# Patient Record
Sex: Female | Born: 1960 | Race: White | Hispanic: No | Marital: Married | Smoking: Never smoker
Health system: Southern US, Community
[De-identification: ages and names within clinical notes are randomized; demographics above are authoritative.]

## PROBLEM LIST (undated history)

## (undated) DIAGNOSIS — G4730 Sleep apnea, unspecified: Secondary | ICD-10-CM

## (undated) DIAGNOSIS — E039 Hypothyroidism, unspecified: Secondary | ICD-10-CM

## (undated) DIAGNOSIS — K219 Gastro-esophageal reflux disease without esophagitis: Secondary | ICD-10-CM

## (undated) DIAGNOSIS — I10 Essential (primary) hypertension: Secondary | ICD-10-CM

## (undated) DIAGNOSIS — I749 Embolism and thrombosis of unspecified artery: Secondary | ICD-10-CM

## (undated) DIAGNOSIS — IMO0001 Reserved for inherently not codable concepts without codable children: Secondary | ICD-10-CM

## (undated) DIAGNOSIS — I517 Cardiomegaly: Secondary | ICD-10-CM

## (undated) DIAGNOSIS — G5601 Carpal tunnel syndrome, right upper limb: Secondary | ICD-10-CM

## (undated) DIAGNOSIS — M1990 Unspecified osteoarthritis, unspecified site: Secondary | ICD-10-CM

## (undated) DIAGNOSIS — E079 Disorder of thyroid, unspecified: Secondary | ICD-10-CM

## (undated) HISTORY — DX: Embolism and thrombosis of unspecified artery: I74.9

## (undated) HISTORY — DX: Cardiomegaly: I51.7

## (undated) HISTORY — PX: EYE SURGERY: SHX253

## (undated) HISTORY — PX: SINUS SURGERY WITH INSTATRAK: SHX5215

## (undated) HISTORY — PX: CARPAL TUNNEL RELEASE: SHX101

## (undated) HISTORY — DX: Carpal tunnel syndrome, right upper limb: G56.01

## (undated) HISTORY — DX: Essential (primary) hypertension: I10

## (undated) HISTORY — DX: Disorder of thyroid, unspecified: E07.9

## (undated) HISTORY — DX: Sleep apnea, unspecified: G47.30

---

## 1997-04-07 ENCOUNTER — Encounter: Payer: Self-pay | Admitting: Critical Care Medicine

## 2004-11-22 ENCOUNTER — Emergency Department (HOSPITAL_COMMUNITY): Admission: EM | Admit: 2004-11-22 | Discharge: 2004-11-22 | Payer: Self-pay | Admitting: Family Medicine

## 2005-04-12 DIAGNOSIS — I749 Embolism and thrombosis of unspecified artery: Secondary | ICD-10-CM

## 2005-04-12 HISTORY — DX: Embolism and thrombosis of unspecified artery: I74.9

## 2005-04-12 HISTORY — PX: KNEE SURGERY: SHX244

## 2005-04-23 ENCOUNTER — Ambulatory Visit (HOSPITAL_COMMUNITY): Admission: EM | Admit: 2005-04-23 | Discharge: 2005-04-24 | Payer: Self-pay | Admitting: Emergency Medicine

## 2005-05-03 ENCOUNTER — Inpatient Hospital Stay (HOSPITAL_COMMUNITY): Admission: EM | Admit: 2005-05-03 | Discharge: 2005-05-06 | Payer: Self-pay | Admitting: Emergency Medicine

## 2005-10-04 ENCOUNTER — Encounter (INDEPENDENT_AMBULATORY_CARE_PROVIDER_SITE_OTHER): Payer: Self-pay | Admitting: *Deleted

## 2005-10-04 ENCOUNTER — Ambulatory Visit (HOSPITAL_COMMUNITY): Admission: RE | Admit: 2005-10-04 | Discharge: 2005-10-04 | Payer: Self-pay

## 2006-01-06 ENCOUNTER — Ambulatory Visit (HOSPITAL_COMMUNITY): Admission: RE | Admit: 2006-01-06 | Discharge: 2006-01-06 | Payer: Self-pay

## 2006-01-06 ENCOUNTER — Encounter: Payer: Self-pay | Admitting: Vascular Surgery

## 2006-08-19 ENCOUNTER — Ambulatory Visit: Payer: Self-pay | Admitting: Vascular Surgery

## 2009-12-25 ENCOUNTER — Encounter: Payer: Self-pay | Admitting: Family

## 2009-12-26 ENCOUNTER — Encounter: Payer: Self-pay | Admitting: Family

## 2010-01-02 ENCOUNTER — Encounter: Payer: Self-pay | Admitting: Family

## 2010-01-14 ENCOUNTER — Encounter: Payer: Self-pay | Admitting: Family

## 2010-01-15 ENCOUNTER — Encounter: Payer: Self-pay | Admitting: Critical Care Medicine

## 2010-01-16 ENCOUNTER — Encounter: Payer: Self-pay | Admitting: Critical Care Medicine

## 2010-01-16 ENCOUNTER — Ambulatory Visit (HOSPITAL_BASED_OUTPATIENT_CLINIC_OR_DEPARTMENT_OTHER): Admission: RE | Admit: 2010-01-16 | Discharge: 2010-01-16 | Payer: Self-pay | Admitting: Internal Medicine

## 2010-01-16 ENCOUNTER — Ambulatory Visit: Payer: Self-pay | Admitting: Diagnostic Radiology

## 2010-01-16 ENCOUNTER — Ambulatory Visit (HOSPITAL_COMMUNITY): Admission: RE | Admit: 2010-01-16 | Discharge: 2010-01-16 | Payer: Self-pay | Admitting: Internal Medicine

## 2010-01-16 ENCOUNTER — Telehealth: Payer: Self-pay | Admitting: Family

## 2010-01-16 ENCOUNTER — Ambulatory Visit: Payer: Self-pay | Admitting: Family

## 2010-01-16 DIAGNOSIS — R609 Edema, unspecified: Secondary | ICD-10-CM

## 2010-01-16 DIAGNOSIS — G4733 Obstructive sleep apnea (adult) (pediatric): Secondary | ICD-10-CM

## 2010-01-16 DIAGNOSIS — I517 Cardiomegaly: Secondary | ICD-10-CM | POA: Insufficient documentation

## 2010-01-16 DIAGNOSIS — R0989 Other specified symptoms and signs involving the circulatory and respiratory systems: Secondary | ICD-10-CM

## 2010-01-16 DIAGNOSIS — R0609 Other forms of dyspnea: Secondary | ICD-10-CM | POA: Insufficient documentation

## 2010-01-16 DIAGNOSIS — J45909 Unspecified asthma, uncomplicated: Secondary | ICD-10-CM

## 2010-01-16 DIAGNOSIS — Z8672 Personal history of thrombophlebitis: Secondary | ICD-10-CM

## 2010-01-16 DIAGNOSIS — E039 Hypothyroidism, unspecified: Secondary | ICD-10-CM

## 2010-01-16 HISTORY — DX: Edema, unspecified: R60.9

## 2010-01-16 HISTORY — DX: Unspecified asthma, uncomplicated: J45.909

## 2010-01-16 HISTORY — DX: Hypothyroidism, unspecified: E03.9

## 2010-01-16 HISTORY — DX: Morbid (severe) obesity due to excess calories: E66.01

## 2010-01-16 HISTORY — DX: Obstructive sleep apnea (adult) (pediatric): G47.33

## 2010-01-16 HISTORY — DX: Other forms of dyspnea: R06.09

## 2010-01-16 HISTORY — DX: Personal history of thrombophlebitis: Z86.72

## 2010-01-16 HISTORY — DX: Cardiomegaly: I51.7

## 2010-01-16 HISTORY — DX: Other specified symptoms and signs involving the circulatory and respiratory systems: R09.89

## 2010-01-16 LAB — CONVERTED CEMR LAB
BUN: 8 mg/dL (ref 6–23)
CO2: 22 meq/L (ref 19–32)
Calcium: 9.5 mg/dL (ref 8.4–10.5)
Glucose, Bld: 93 mg/dL (ref 70–99)
Potassium: 4.1 meq/L (ref 3.5–5.3)
Pro B Natriuretic peptide (BNP): 13.1 pg/mL (ref 0.0–100.0)
Sodium: 139 meq/L (ref 135–145)

## 2010-01-19 ENCOUNTER — Encounter (INDEPENDENT_AMBULATORY_CARE_PROVIDER_SITE_OTHER): Payer: Self-pay | Admitting: *Deleted

## 2010-01-19 ENCOUNTER — Encounter: Payer: Self-pay | Admitting: Family

## 2010-01-21 ENCOUNTER — Telehealth: Payer: Self-pay | Admitting: Family

## 2010-01-23 ENCOUNTER — Ambulatory Visit: Payer: Self-pay | Admitting: Diagnostic Radiology

## 2010-01-23 ENCOUNTER — Telehealth: Payer: Self-pay | Admitting: Family

## 2010-01-23 ENCOUNTER — Ambulatory Visit (HOSPITAL_BASED_OUTPATIENT_CLINIC_OR_DEPARTMENT_OTHER): Admission: RE | Admit: 2010-01-23 | Discharge: 2010-01-23 | Payer: Self-pay | Admitting: Internal Medicine

## 2010-01-23 ENCOUNTER — Ambulatory Visit: Payer: Self-pay | Admitting: Family

## 2010-01-23 DIAGNOSIS — Z87898 Personal history of other specified conditions: Secondary | ICD-10-CM

## 2010-01-23 DIAGNOSIS — R05 Cough: Secondary | ICD-10-CM

## 2010-01-23 DIAGNOSIS — R059 Cough, unspecified: Secondary | ICD-10-CM

## 2010-01-23 HISTORY — DX: Cough, unspecified: R05.9

## 2010-01-23 HISTORY — DX: Personal history of other specified conditions: Z87.898

## 2010-01-26 ENCOUNTER — Encounter: Payer: Self-pay | Admitting: Family

## 2010-01-26 ENCOUNTER — Ambulatory Visit: Payer: Self-pay | Admitting: Cardiology

## 2010-01-26 ENCOUNTER — Ambulatory Visit (HOSPITAL_COMMUNITY): Admission: RE | Admit: 2010-01-26 | Discharge: 2010-01-26 | Payer: Self-pay | Admitting: Family

## 2010-01-26 ENCOUNTER — Ambulatory Visit: Payer: Self-pay

## 2010-01-26 ENCOUNTER — Encounter: Payer: Self-pay | Admitting: Cardiology

## 2010-01-29 ENCOUNTER — Telehealth: Payer: Self-pay | Admitting: Family

## 2010-01-29 ENCOUNTER — Ambulatory Visit: Payer: Self-pay | Admitting: Critical Care Medicine

## 2010-01-29 DIAGNOSIS — I519 Heart disease, unspecified: Secondary | ICD-10-CM

## 2010-01-29 DIAGNOSIS — I10 Essential (primary) hypertension: Secondary | ICD-10-CM | POA: Insufficient documentation

## 2010-01-29 DIAGNOSIS — J018 Other acute sinusitis: Secondary | ICD-10-CM

## 2010-01-29 HISTORY — DX: Essential (primary) hypertension: I10

## 2010-01-29 HISTORY — DX: Heart disease, unspecified: I51.9

## 2010-01-29 HISTORY — DX: Other acute sinusitis: J01.80

## 2010-01-30 ENCOUNTER — Telehealth: Payer: Self-pay | Admitting: Critical Care Medicine

## 2010-01-30 ENCOUNTER — Telehealth (INDEPENDENT_AMBULATORY_CARE_PROVIDER_SITE_OTHER): Payer: Self-pay | Admitting: *Deleted

## 2010-02-01 DIAGNOSIS — K219 Gastro-esophageal reflux disease without esophagitis: Secondary | ICD-10-CM

## 2010-02-01 HISTORY — DX: Gastro-esophageal reflux disease without esophagitis: K21.9

## 2010-02-02 ENCOUNTER — Ambulatory Visit (HOSPITAL_BASED_OUTPATIENT_CLINIC_OR_DEPARTMENT_OTHER): Admission: RE | Admit: 2010-02-02 | Discharge: 2010-02-02 | Payer: Self-pay | Admitting: Critical Care Medicine

## 2010-02-02 ENCOUNTER — Encounter: Payer: Self-pay | Admitting: Critical Care Medicine

## 2010-02-02 ENCOUNTER — Encounter: Payer: Self-pay | Admitting: Family

## 2010-02-02 ENCOUNTER — Ambulatory Visit: Payer: Self-pay | Admitting: Interventional Radiology

## 2010-02-02 ENCOUNTER — Telehealth: Payer: Self-pay | Admitting: Family

## 2010-02-02 DIAGNOSIS — E559 Vitamin D deficiency, unspecified: Secondary | ICD-10-CM

## 2010-02-02 HISTORY — DX: Vitamin D deficiency, unspecified: E55.9

## 2010-02-05 LAB — CONVERTED CEMR LAB
ALT: 11 units/L (ref 0–35)
Albumin: 4.1 g/dL (ref 3.5–5.2)
Alkaline Phosphatase: 94 units/L (ref 39–117)
Indirect Bilirubin: 0.5 mg/dL (ref 0.0–0.9)
Total Protein: 7.1 g/dL (ref 6.0–8.3)

## 2010-02-13 ENCOUNTER — Encounter: Payer: Self-pay | Admitting: Critical Care Medicine

## 2010-02-18 ENCOUNTER — Encounter: Payer: Self-pay | Admitting: Critical Care Medicine

## 2010-02-26 ENCOUNTER — Ambulatory Visit: Payer: Self-pay | Admitting: Critical Care Medicine

## 2010-03-09 ENCOUNTER — Telehealth (INDEPENDENT_AMBULATORY_CARE_PROVIDER_SITE_OTHER): Payer: Self-pay | Admitting: *Deleted

## 2010-03-10 ENCOUNTER — Encounter: Payer: Self-pay | Admitting: Critical Care Medicine

## 2010-03-26 ENCOUNTER — Ambulatory Visit: Payer: Self-pay | Admitting: Critical Care Medicine

## 2010-04-03 ENCOUNTER — Telehealth (INDEPENDENT_AMBULATORY_CARE_PROVIDER_SITE_OTHER): Payer: Self-pay | Admitting: *Deleted

## 2010-04-09 ENCOUNTER — Ambulatory Visit (HOSPITAL_COMMUNITY)
Admission: RE | Admit: 2010-04-09 | Discharge: 2010-04-09 | Payer: Self-pay | Source: Home / Self Care | Attending: Otolaryngology | Admitting: Otolaryngology

## 2010-04-10 ENCOUNTER — Ambulatory Visit (HOSPITAL_COMMUNITY)
Admission: RE | Admit: 2010-04-10 | Discharge: 2010-04-10 | Payer: Self-pay | Source: Home / Self Care | Attending: Otolaryngology | Admitting: Otolaryngology

## 2010-04-10 ENCOUNTER — Telehealth: Payer: Self-pay | Admitting: Family

## 2010-04-17 ENCOUNTER — Telehealth (INDEPENDENT_AMBULATORY_CARE_PROVIDER_SITE_OTHER): Payer: Self-pay | Admitting: *Deleted

## 2010-04-24 ENCOUNTER — Ambulatory Visit
Admission: RE | Admit: 2010-04-24 | Discharge: 2010-04-24 | Payer: Self-pay | Source: Home / Self Care | Attending: Critical Care Medicine | Admitting: Critical Care Medicine

## 2010-05-12 NOTE — Progress Notes (Signed)
Summary: Lab results  Phone Note Outgoing Call   Reason for Call: Discuss lab or test results Summary of Call: call pt, tell her liver function test ok,  IgE level now normal ok to start zyflo Initial call taken by: Storm Frisk MD,  January 30, 2010 10:59 AM  Follow-up for Phone Call        Called pt's home and cell #s -- [facility]  Gweneth Dimitri RN  January 30, 2010 2:01 PM   Pt returned call.  She was informed of above results and recs per PW.  She verbalized understanding. Follow-up by: Gweneth Dimitri RN,  January 30, 2010 2:06 PM

## 2010-05-12 NOTE — Progress Notes (Signed)
Summary: returning your call  Phone Note Outgoing Call Call back at [phone]   Summary of Call: Left message for patient to return my call.  Initial call taken by: Lemont Fillers FNP,  January 23, 2010 11:39 AM  Follow-up for Phone Call        Pt returned your call. Nicki Guadalajara Fergerson CMA Duncan Dull)  January 23, 2010 1:44 PM   Additional Follow-up for Phone Call Additional follow up Details #1::        Called patient.  She was notified about CT this afternoon at 4pm. Additional Follow-up by: Lemont Fillers FNP,  January 23, 2010 2:32 PM

## 2010-05-12 NOTE — Consult Note (Signed)
Summary: [facility]  [facility]   Imported By: Lester [place] 02/24/2010 09:29:13  _____________________________________________________________________  External Attachment:    Type:   Image     Comment:   External Document

## 2010-05-12 NOTE — Letter (Signed)
Summary: [facility]  [facility]   Imported By: Sherian Rein 02/05/2010 08:45:46  _____________________________________________________________________  External Attachment:    Type:   Image     Comment:   External Document

## 2010-05-12 NOTE — Progress Notes (Signed)
Summary: RECORDS REC FROM [facility]   Phone Note Other Incoming   Caller: [place] [facility]  Summary of Call: records rec from [place] [facility]  Initial call taken by: Roselle Locus,  February 02, 2010 8:49 AM

## 2010-05-12 NOTE — Letter (Signed)
Summary: Records Dated 02-27-07 thru 10-30-09/Greg Shary Decamp MD  Records Dated 02-27-07 thru 10-30-09/Greg Shary Decamp MD   Imported By: Lanelle Bal 02/10/2010 09:56:01  _____________________________________________________________________  External Attachment:    Type:   Image     Comment:   External Document

## 2010-05-12 NOTE — Letter (Signed)
   [place] [facility] [address] [place], [place]  [address]  [place] Phone: [phone]      January 19, 2010   [facility] [place] [address] [place], [place] [address]  RE:  LAB RESULTS  Dear  Ms. Scheper,  The following is an interpretation of your most recent lab tests.  Please take note of any instructions provided or changes to medications that have resulted from your lab work.  ELECTROLYTES:  Good - no changes needed  KIDNEY FUNCTION TESTS:  Good - no changes needed  DIABETIC STUDIES:  Excellent - no changes needed Blood Glucose: 93   Your heart failure screening test is normal.   Sincerely Yours,    Lemont Fillers FNP  Appended Document:  Mailed.

## 2010-05-12 NOTE — Miscellaneous (Signed)
Summary: CT Sinus  Clinical Lists Changes  Observations: Added new observation of CT OF SINUS: IMPRESSION: Pansinusitis with postop changes as above (02/02/2010 9:25)      CT of Sinus  Procedure date:  02/02/2010  Findings:      IMPRESSION: Pansinusitis with postop changes as above

## 2010-05-12 NOTE — Miscellaneous (Signed)
Summary: Orders Update  Clinical Lists Changes  Medications: Changed medication from LEVAQUIN 750 MG  TABS (LEVOFLOXACIN) One tablet by mouth daily to LEVAQUIN 750 MG  TABS (LEVOFLOXACIN) One tablet by mouth daily - Signed Rx of LEVAQUIN 750 MG  TABS (LEVOFLOXACIN) One tablet by mouth daily;  #7 x 0;  Signed;  Entered by: Storm Frisk MD;  Authorized by: Storm Frisk MD;  Method used: [facility]  [address] [phone]*, [address] [place], [place], [place]  [address], Ph: [phone] or [phone], Fax: [phone] Orders: Added new Referral order of ENT Referral (ENT) - Signed    Prescriptions: LEVAQUIN 750 MG  TABS (LEVOFLOXACIN) One tablet by mouth daily  #7 x 0   Entered and Authorized by:   Storm Frisk MD   Signed by:   Storm Frisk MD on 02/02/2010   Method used:   [facility]        [facility]  [address] [phone]* (retail)       [address]       [place], [place]  [address]       Ph: [phone] or [phone]       Fax: [phone]   RxID:   [id number]

## 2010-05-12 NOTE — Letter (Signed)
Summary: Primary Care Consult Scheduled Letter  [place] [facility]  [address]   [place], [place] [address]   Phone: [phone]  Fax: [phone]      01/19/2010 MRN: [id number]  Beth Rodriguez [address] [place], [place]  [address]    Dear [name],      We have scheduled an appointment for you.  At the recommendation of MELISSA O'SULLIVAN FNP, we have scheduled you for 2D ECHO @  [facility]  on OCTOBER 17,2011 at [facility].  Their address is_[address] . The office phone number is [phone].  If this appointment day and time is not convenient for you, please feel free to call the office of the doctor you are being referred to at the number listed above and reschedule the appointment.     It is important for you to keep your scheduled appointments. We are here to make sure you are given good patient care. If you have questions or you have made changes to your appointment, please notify us at  [phone], ask for HELEN.    Thank you,  Darral Dash Patient Care Coordinator [place] at [facility]

## 2010-05-12 NOTE — Progress Notes (Signed)
Summary: Medication clarification, echo result  Phone Note Call from Patient Call back at Home Phone [phone]   Caller: Patient Call For: Lemont Fillers FNP Summary of Call: patient called and left voice message requesting clarification on Astepro nasal spray.The directions she currenly has are 2 sprays each nostril two times a day  Initial call taken by: Glendell Docker CMA,  January 29, 2010 3:15 PM  Follow-up for Phone Call        That is correct- two sprays each nostril twice daily. Please review with patient. Follow-up by: Lemont Fillers FNP,  January 30, 2010 8:45 AM  Additional Follow-up for Phone Call Additional follow up Details #1::        Advised pt per Nyasia Baxley's instructions and she voices understanding. Pt requested results of echocardiogram. Advised pt result not back yet but we will call her when it is final. Pt states we can leave detailed msg on home phone.   Please advise. Nicki Guadalajara Fergerson CMA Duncan Dull)  January 30, 2010 9:32 AM   New Problems: DIASTOLIC DYSFUNCTION (ICD-429.9)   Additional Follow-up for Phone Call Additional follow up Details #2::    Called patient, reviewed echo results including note of grade 2 diastolic dysfunction. Follow-up by: Lemont Fillers FNP,  January 30, 2010 10:23 AM  New Problems: DIASTOLIC DYSFUNCTION (ICD-429.9)

## 2010-05-12 NOTE — Progress Notes (Signed)
Summary: Lab results  Phone Note Call from Patient Call back at Home Phone [phone]   Caller: Patient Summary of Call: Pt states that Dr Altimer's office ordered blood tests & has not called with results, can you help? Initial call taken by: Lannette Donath,  January 21, 2010 9:40 AM  Follow-up for Phone Call         We unfortunately do not have access to their lab work.  She will need to call their office to have them review her lab work with her.  Please notify patient. Follow-up by: Lemont Fillers FNP,  January 21, 2010 10:25 AM  Additional Follow-up for Phone Call Additional follow up Details #1::        Pt said she will try calling the doctor's office back again Diane Tomerlin  January 21, 2010 11:08 AM

## 2010-05-12 NOTE — Letter (Signed)
Summary: [facility]  [facility]   Imported By: Lennie Odor 03/20/2010 15:30:20  _____________________________________________________________________  External Attachment:    Type:   Image     Comment:   External Document

## 2010-05-12 NOTE — Letter (Signed)
Summary: Primary Care Consult Scheduled Letter  [place] at [facility]  [address]   [place], [place] [address]   Phone: [phone]  Fax: [phone]      01/19/2010 MRN: [id number]  Beth Rodriguez [address] [place], [place]  [address]    Dear Ms. Kapler,      We have scheduled an appointment for you.  At the recommendation of MELISSA O'SULLIVAN,FNP, we have scheduled you a consult with DR Lanny Cramp [facility] [place] on NOVEMBER 8 ,2011 at 1:45PM .  Their address is_[address], [place]  . The office phone number is [phone].  If this appointment day and time is not convenient for you, please feel free to call the office of the doctor you are being referred to at the number listed above and reschedule the appointment.     It is important for you to keep your scheduled appointments. We are here to make sure you are given good patient care. If you have questions or you have made changes to your appointment, please notify us at  [phone], ask for HELEN.    Thank you,  Darral Dash Patient Care Coordinator [place] at [facility]

## 2010-05-12 NOTE — Assessment & Plan Note (Signed)
Summary: Pulmonary OV   Copy to:  Sandford Craze Primary Provider/Referring Provider:  Lemont Fillers FNP  CC:  1 month followup sob mostly with exertion, cough with occasional production, wheezing pt says not much different from last ov. Saw Dr Lucie Leather last week  was feeling bad, and sob. Was given a steroid shot which did help a little.  History of Present Illness: Pulmonary OV 50yo WF with severe persistent asthma, chronic sinusitis, severe atopy.  Pt referred for second opinion. Primary Pulm MD in [facility]   February 26, 2010 11:53 AM This pt is not any better.  The pt still with the cough. The pt  saw ENT. Pt was rx:   saline rx two times a day and  levaquin rx for two weeks The pt is due to go back to ENT 11.29. There is a ? repeat surgery on sinuses The notes still with prod cough, mucus is thick, no color.  The pt stil with exp wheezes and dyspnea.  No chest pain.   Preventive Screening-Counseling & Management  Alcohol-Tobacco     Smoking Status: never  Allergies: 1)  ! Theophylline  Past History:  Past medical, surgical, family and social histories (including risk factors) reviewed, and no changes noted (except as noted below).  Past Medical History: Reviewed history from 01/29/2010 and no changes required. Asthma HTN Hx of blood clot right leg--2007 Hypothyroidism Enlarged Heart sleep apnea? carpal tunnel--right hand  Past Surgical History: Reviewed history from 01/16/2010 and no changes required. Right knee surgery--2007 Sinus surgery-- ? year  Family History: Reviewed history from 01/16/2010 and no changes required. Mother-- deceased at 42, alzheimers, CHF, HTN Father-- deceased, MI at age 87, alzheimer, HTN 1 brother-- MI at 4, living 1 brother-- ?colon cancer- he is in [place], about 12 years older than pt.  1 brother-- deceased, HTN, schizophrenic, died at age 70 MVA 1 sister-- 20 years older than patient- diabetes, skin cancer, HTN,  hypercholesterolemia 1 daughter-- HTN, carpal tunnel, bone spurs, bulging discs  Social History: Reviewed history from 01/29/2010 and no changes required. Occupation: Catheter assembly GED Married Never Smoked Alcohol use-no Drug use-no Regular exercise-no 1 daughter  Review of Systems       The patient complains of shortness of breath with activity, productive cough, headaches, nasal congestion/difficulty breathing through nose, and change in color of mucus.  The patient denies shortness of breath at rest, non-productive cough, coughing up blood, chest pain, irregular heartbeats, acid heartburn, indigestion, loss of appetite, weight change, abdominal pain, difficulty swallowing, sore throat, tooth/dental problems, sneezing, itching, ear ache, anxiety, depression, hand/feet swelling, joint stiffness or pain, rash, and fever.    Vital Signs:  Patient profile:   50 year old female Menstrual status:  irregular, menopause Height:      61 inches Weight:      296 pounds BMI:     56.13 O2 Sat:      97 % on Room air Temp:     97.6 degrees F oral Pulse rate:   85 / minute BP sitting:   150 / 70  (right arm) Cuff size:   large  Vitals Entered By: Kandice Hams CMA (February 26, 2010 11:42 AM)  O2 Flow:  Room air CC: 1 month followup sob mostly with exertion, cough with occasional production, wheezing pt says not much different from last ov. Saw Dr Lucie Leather last week  was feeling bad, sob. Was given a steroid shot which did help a little Comments pt currently on  Levaquin 2 days left given by ENT Dr Jearld Fenton   Physical Exam  Additional Exam:  Gen: Pleasant, well-nourished, in no distress,  normal affect ENT: No lesions,  mouth clear,  oropharynx clear, no postnasal drip, mucosal edema  Neck: No JVD, no TMG, no carotid bruits Lungs: No use of accessory muscles, no dullness to percussion, exp wheeze, poor air flow Cardiovascular: RRR, heart sounds normal, no murmur or gallops, no peripheral  edema Abdomen: soft and NT, no HSM,  BS normal Musculoskeletal: No deformities, no cyanosis or clubbing Neuro: alert, non focal Skin: Warm, no lesions or rashes    Impression & Recommendations:  Problem # 1:  ASTHMA (ICD-493.90) Assessment Deteriorated  severe persistent asthma with persistent sinusitis driving ongoing lower airway inflammation plan  No change in inhaled medications.   Maintain treatment program as currently prescribed. 10days more of avelox keep Ent 11/29 OV ? needs surgery again depomedrol 120mg  IM  Medications Added to Medication List This Visit: 1)  Levaquin 500 Mg Tabs (Levofloxacin) .Marland Kitchen.. 1 once a day for 2 weeks 2)  Avelox 400 Mg Tabs (Moxifloxacin hcl) .... By mouth daily: start after levaquin is completed  Complete Medication List: 1)  Alvesco 160 Mcg/act Aers (Ciclesonide) .... 2 puffs two times a day. 2)  Dulera 200-5 Mcg/act Aero (Mometasone furo-formoterol fum) .... 2 puffs two times a day. 3)  Losartan Potassium 50 Mg Tabs (Losartan potassium) .... Take 1 tablet by mouth once a day 4)  Pantoprazole Sodium 40 Mg Tbec (Pantoprazole sodium) .... Take 1 tablet by mouth two times a day. 5)  Tessalon Perles 100 Mg Caps (Benzonatate) .... Take 1 capsule by mouth three times a day. 6)  Synthroid 75 Mcg Tabs (Levothyroxine sodium) .... Take 1 tablet by mouth once a day. 7)  Astepro 0.15 % Soln (Azelastine hcl) .... 2 sprays each nostil  twice daily 8)  Oxygen  .... 2l/min at bedtime. 9)  Proair Hfa 108 (90 Base) Mcg/act Aers (Albuterol sulfate) .... As needed. 10)  Furosemide 20 Mg Tabs (Furosemide) .... One tablet by mouth once daily as needed for swelling 11)  Albuterol Sulfate (2.5 Mg/64ml) 0.083% Nebu (Albuterol sulfate) .... One nebulizer every 6 hours as needed for wheezing. 12)  Zyflo Cr 600 Mg Tb12 (Zileuton) .... Two tablets  by mouth twice daily 13)  Spiriva Handihaler 18 Mcg Caps (Tiotropium bromide monohydrate) .... Two puffs in handihaler  daily 14)  Avelox 400 Mg Tabs (Moxifloxacin hcl) .... By mouth daily: start after levaquin is completed  Other Orders: Est. Patient Level V ([address]) Depo- Medrol 40mg  (J1030) Depo- Medrol 80mg  (J1040) Admin of Therapeutic Inj  intramuscular or subcutaneous (91478)  Patient Instructions: 1)  No change in current medicaitons 2)  I will give you another 10days of avelox to take one daily after levaquin runs out (6 samples given, Rx for 4 sent to pharmacy) 3)  A steroid injection 120mg  Depomedrol will be given 4)  Keep your 11/29 ENT appt 5)  Return [place] 1 month  Prescriptions: AVELOX 400 MG  TABS (MOXIFLOXACIN HCL) By mouth daily: start after levaquin is completed  #4 x 0   Entered and Authorized by:   Storm Frisk MD   Signed by:   Storm Frisk MD on 02/26/2010   Method used:   [facility]  [address] [phone][facility]ail)       [address]  [place], [place]  [address]       Ph: [phone] or [phone]       Fax: [phone]   RxID:   [id number]    Medication Administration  Injection # 1:    Medication: Depo- Medrol 40mg     Diagnosis: ASTHMA (ICD-493.90)    Route: IM    Site: LUOQ gluteus    Exp Date: 03/26/2010    Lot #: [id number]    Mfr: Pharmacia    Patient tolerated injection without complications    Given by: Kandice Hams CMA (February 26, 2010 12:23 PM)  Injection # 2:    Medication: Depo- Medrol 80mg     Diagnosis: ASTHMA (ICD-493.90)    Route: IM    Site: LUOQ gluteus    Exp Date: 03/26/2010    Lot #: [id number]    Mfr: [employer]acia    Comments: pt given a total 120 mg Depo Medrol    Patient tolerated injection without complications    Given by: Kandice Hams CMA (February 26, 2010 12:25 PM)  Orders Added: 1)  Est. Patient Level V [70623] 2)  Depo- Medrol 40mg  [J1030] 3)  Depo- Medrol 80mg  [J1040] 4)  Admin of Therapeutic Inj  intramuscular or subcutaneous [76283]

## 2010-05-12 NOTE — Letter (Signed)
Summary: Records Dated 03-04-09 thru 01-16-10/[facility]  Records Dated 03-04-09 thru 01-16-10/[facility]   Imported By: Lanelle Bal 02/12/2010 10:05:59  _____________________________________________________________________  External Attachment:    Type:   Image     Comment:   External Document

## 2010-05-12 NOTE — Assessment & Plan Note (Signed)
Summary: bcbs new legs swollen and hot/mhf--Rm 4   Vital Signs:  Patient profile:   50 year old female Menstrual status:  irregular, menopause LMP:     01/09/2010 Height:      62 inches Weight:      298 pounds BMI:     54.70 Temp:     98.0 degrees F oral Pulse rate:   112 / minute Pulse rhythm:   regular Resp:     18 per minute BP sitting:   148 / 90  (right arm) Cuff size:   large  Vitals Entered By: Mervin Kung CMA [facility]) (January 16, 2010 1:57 PM) CC: Rm 5   New pt to establish care.  Has bilateral lower extremity edema x months but recently increasing x 2weeks with redness. Is Patient Diabetic? No Pain Assessment Patient in pain? no      LMP (date): 01/09/2010     Menstrual Status irregular, menopause Enter LMP: 01/09/2010   CC:  Rm 5   New pt to establish care.  Has bilateral lower extremity edema x months but recently increasing x 2weeks with redness.Marland Kitchen  History of Present Illness: Ms Mcglade is a 50 year old female who presents today with complaint of LE edema.  Pt reports + history of "enlarged heart."  Denies fever.    Pneumonia-  pt reports that she was diagnosed with pneumonia about 2 weeks ago.  She saw Dr. Army Melia [facility]) yesterday for follow up (was treated with avelox) and was told that CXR yesterday was unchanged.  Denies fever.  Cough is productive of clear/thick sputum.  + wheezing which is intermittent.  She tells me that Dr. Bryson Ha nurse told her that they were going to call in an antiobiotic yesterday, but never did- office is currently closed for the weekend.  Chronic cough x 1 year.  + history of asthma.   Can't exercise, +DOE.  Never smoked.  Cough hacking.  Denies current nasal drip/congestion.    Adrenal Insuffiency-  Sees Dr. Rocky Crafts.  She is in the process of switching her care to the [place] area.  She is scheduled to establish with Dr. Alfredo Bach in November.  Pt notes that she has long standing history of  Prednisone use which  she was taking due to history of asthma.  Her last dose of prednisone was about 6 months ago, however she tells me that she completed a  taper of hydrocortisone which she completed 2 weeks ago.    History of OSA-  Intolerant of CPAP, uses 02 2liters [place].    Hypothyroid- underactive,  on synthroid.   ?MAC infection- pt is on 3x weekly rx of azithromycin.  Not clear if this is prophylaxis or if she has a documented infection.  Will try to obtain old records.  Preventive Screening-Counseling & Management  Alcohol-Tobacco     Alcohol drinks/day: 0     Smoking Status: never  Caffeine-Diet-Exercise     Caffeine use/day: <1 daily     Does Patient Exercise: no      Drug Use:  no.    Allergies (verified): 1)  ! Theophylline  Past History:  Past Medical History: Asthma HTN Hx of blood clot right leg--2007 Hypothyroidism? Enlarged Heart sleep apnea? carpal tunnel--right hand  Past Surgical History: Right knee surgery--2007 Sinus surgery-- ? year  Family History: Mother-- deceased at 35, alzheimers, CHF, HTN Father-- deceased, MI at age 46, alzheimer, HTN 1 brother-- MI at 32, living 1 brother-- ?colon cancer- he  is in [place], about 12 years older than pt.  1 brother-- deceased, HTN, schizophrenic, died at age 32 MVA 1 sister-- 20 years older than patient- diabetes, skin cancer, HTN, hypercholesterolemia 1 daughter-- HTN, carpal tunnel, bone spurs, bulging discs  Social History: Occupation: Catheter assembly GED Married Never Smoked Alcohol use-no Drug use-no Regular exercise-no Smoking Status:  never Caffeine use/day:  <1 daily Does Patient Exercise:  no Drug Use:  no  Review of Systems       Constitutional: Denies Fever ENT:  Denies nasal congestion or sore throat. Resp: + cough, denies hemoptysis, + thick phlegm CV:  Denies Chest Pain today,  denies palpitations, sleeps on one pillow.   GI:  Denies nausea or vomitting or diarrhea, + anorexia GU: Denies  dysuria Lymphatic: Denies lymphadenopathy Musculoskeletal:  bilateral knee, shoulder, arms Skin:  Rash on the back of her right leg Psychiatric: Denies depression or anxiety Neuro: + numbness in both hands due to carpal tunnel.     Physical Exam  General:  Morbidly obese white female awake, alert, and in NAD Head:  Normocephalic and atraumatic without obvious abnormalities. No apparent alopecia or balding. Eyes:  PERRLA Mouth:  Tongue noted to have white patches, pharynx pink and moist.   Neck:  No deformities, masses, or tenderness noted. Lungs:  Normal respiratory effort, chest expands symmetrically. + expiratory wheeze noted on right which clears with cough. Heart:  Normal rate and regular rhythm. S1 and S2 normal without gallop, murmur, click, rub or other extra sounds. Extremities:  Bilateral LE are obese and edemetous.  2-3+ bilateral LE edema.  + mild erythema noted at base of right leg.  Also some tissue induration noted at base of right calf. Neurologic:  alert & oriented X3 and gait normal.   Psych:  Cognition and judgment appear intact. Alert and cooperative with normal attention span and concentration. No apparent delusions, illusions, hallucinations   Impression & Recommendations:  Problem # 1:  CELLULITIS, LEG, RIGHT (ICD-682.6) Assessment New Suspect mild cellulitis, will treat with ceftin to cover possible cellulitis as well as what she is reporting to be an incompletely treated pneumonia.  Check bilateral LE doppler to rule out DVT.  (Pt has prior hx of DVT in the right leg) Her updated medication list for this problem includes:    Azithromycin 250 Mg Tabs (Azithromycin) .Marland Kitchen... Take 1 tablet three times a week.    Ceftin 500 Mg Tabs (Cefuroxime axetil) ..... One tablet by mouth two times a day x 7 days  Problem # 2:  EDEMA (ICD-782.3) Assessment: New Will check BNP to evaluate for CHF.  Add furosemide 20mg  daily.  Check baseline electrolytes and renal function. Her  updated medication list for this problem includes:    Furosemide 20 Mg Tabs (Furosemide) ..... One tablet by mouth once daily as needed for swelling  Orders: TLB-BMP (Basic Metabolic Panel-BMET) (80048-METABOL) T-BNP [facility]) (83880-BNP) Misc. Referral (Misc. Ref) Misc. Referral (Misc. Ref)  Problem # 3:  PNEUMONIA (ICD-486) Assessment: Comment Only  This has been managed by Dr. Army Melia, last CXR was yesterday per pt.  ? hx of MAC- unclear if she is in zithromax as prophylaxis or if she is on it for treatment of MAC- she is a rather poor historian.  Will attempt to retrieve old records.   Her updated medication list for this problem includes:    Azithromycin 250 Mg Tabs (Azithromycin) .Marland Kitchen... Take 1 tablet three times a week.    Ceftin 500 Mg Tabs (  Cefuroxime axetil) ..... One tablet by mouth two times a day x 7 days  Orders: Pulmonary Referral (Pulmonary)  Problem # 4:  DYSPNEA ON EXERTION (ICD-786.09) Assessment: Comment Only  Suspect that this is multifactorial.   She has history of severe asthma which apparently was treated with long term steroids.  She also is morbidly obese with + OSA and an "enlarged heart."  She is 02 dependent at night.  Suspect some R sided heart failure.  PE is also in the differential.  If + doppler, will need CTA chest.  Instructed patient to continue her inhalers.  She desires a second opinion on her asthma and chronic cough.  Will refer to Dr. Vassie Loll.  Her updated medication list for this problem includes:    Alvesco 160 Mcg/act Aers (Ciclesonide) .Marland Kitchen... 2 puffs two times a day.    Dulera 200-5 Mcg/act Aero (Mometasone furo-formoterol fum) .Marland Kitchen... 2 puffs two times a day.    Proair Hfa 108 (90 Base) Mcg/act Aers (Albuterol sulfate) .Marland Kitchen... As needed.    Furosemide 20 Mg Tabs (Furosemide) ..... One tablet by mouth once daily as needed for swelling    Albuterol Sulfate (2.5 Mg/70ml) 0.083% Nebu (Albuterol sulfate) ..... One nebulizer every 6 hours as needed for  wheezing.  Orders: Misc. Referral (Misc. Ref) Pulmonary Referral (Pulmonary)  Problem # 5:  CANDIDIASIS, ORAL (ICD-112.0) Assessment: New Likely due to steroid inhalers.  Will treat with nystatin.   Complete Medication List: 1)  Alvesco 160 Mcg/act Aers (Ciclesonide) .... 2 puffs two times a day. 2)  Dulera 200-5 Mcg/act Aero (Mometasone furo-formoterol fum) .... 2 puffs two times a day. 3)  Vitamin D (ergocalciferol) 50000 Unit Caps (Ergocalciferol) .... Take 1 tablet once a week. 4)  Losartan Potassium 50 Mg Tabs (Losartan potassium) .... Take 1 tablet by mouth once a day. 5)  Pantoprazole Sodium 40 Mg Tbec (Pantoprazole sodium) .... Take 1 tablet by mouth two times a day. 6)  Tessalon Perles 100 Mg Caps (Benzonatate) .... Take 1 capsule by mouth three times a day. 7)  Synthroid 75 Mcg Tabs (Levothyroxine sodium) .... Take 1 tablet by mouth once a day. 8)  Proair Hfa 108 (90 Base) Mcg/act Aers (Albuterol sulfate) .... As needed. 9)  Oxygen  .... 2l/min at bedtime. 10)  Azithromycin 250 Mg Tabs (Azithromycin) .... Take 1 tablet three times a week. 11)  Ceftin 500 Mg Tabs (Cefuroxime axetil) .... One tablet by mouth two times a day x 7 days 12)  Furosemide 20 Mg Tabs (Furosemide) .... One tablet by mouth once daily as needed for swelling 13)  Albuterol Sulfate (2.5 Mg/72ml) 0.083% Nebu (Albuterol sulfate) .... One nebulizer every 6 hours as needed for wheezing. 14)  Nystatin 100000 Unit/ml Susp (Nystatin) .... One teaspoon swish and swallow 4 times daily until symptoms resolved  Patient Instructions: 1)  You will be contacted about your referral  for the Echocardiogram and your referral to Dr. Vassie Loll. 2)  Use either your albuterol nebulizer or your proair inhaler every 6 hours for the next one week. 3)  Please follow up in 1 week. Prescriptions: NYSTATIN 100000 UNIT/ML SUSP (NYSTATIN) one teaspoon swish and swallow 4 times daily until symptoms resolved  #250 ml x 0   Entered and  Authorized by:   Lemont Fillers FNP   Signed by:   Lemont Fillers FNP on 01/16/2010   Method used:   Electronically to        [facility]  [address] [phone]* (  retail)       [address]       [place], [place]  [address]       Ph: [phone] or [phone]       Fax: [phone]   RxID:   [id number] CEFTIN 500 MG TABS (CEFUROXIME AXETIL) one tablet by mouth two times a day x 7 days  #14 x 0   Entered and Authorized by:   Lemont Fillers FNP   Signed by:   Lemont Fillers FNP on 01/16/2010   Method used:   Electronically to        [facility]  [address] [phone][facility] (retail)       [address]       [place], [place]  [address]       Ph: [phone] or [phone]       Fax: [phone]   RxID:   [id number] CEPHALEXIN 500 MG CAPS (CEPHALEXIN) 2 caps by mouth two times a day x 7 days  #28 x 0   Entered and Authorized by:   Lemont Fillers FNP   Signed by:   Lemont Fillers FNP on 01/16/2010   Method used:   [facility]        [facility]  [address] [phone][facility] (retail)       [address]       [place], [place]  [address]       Ph: [phone] or [phone]       Fax: [phone]   RxID:   [id number]   Current Allergies (reviewed today): ! THEOPHYLLINE   Preventive Care Screening  Last Tetanus Booster:    Date:  04/12/2005    Results:  Historical      Last mammogram was 2008, normal. Last pap smear 2008, normal. Pt has had bone density 1 yr ago? ?results. Never had colonoscopy. Nicki Guadalajara Fergerson CMA [facility])  January 16, 2010 2:21 PM      Contraindications/Deferment of Procedures/Staging:    Test/Procedure: FLU VAX    Reason for deferment: patient declined

## 2010-05-12 NOTE — Progress Notes (Signed)
Summary: samples  Phone Note Call from Patient Call back at Home Phone [phone]   Caller: Patient Call For: wright Summary of Call: pt wants samples of zyflo cr.  Initial call taken by: Tivis Ringer, CNA,  March 09, 2010 12:06 PM  Follow-up for Phone Call        Samples left up front for pt.  LMOM for pt to be aware. Follow-up by: Vernie Murders,  March 09, 2010 2:32 PM

## 2010-05-12 NOTE — Progress Notes (Signed)
  Phone Note Other Incoming   Caller: patient Summary of Call: Furosemide not sent to pharmacy.  Will send. Initial call taken by: Lemont Fillers FNP,  January 16, 2010 5:04 PM    Prescriptions: FUROSEMIDE 20 MG TABS (FUROSEMIDE) one tablet by mouth once daily as needed for swelling  #30 x 0   Entered and Authorized by:   Lemont Fillers FNP   Signed by:   Lemont Fillers FNP on 01/16/2010   Method used:   Electronically to        [facility]  [address] [phone]* (retail)       [address]       [place], [place]  [address]       Ph: [phone] or [phone]       Fax: [phone]   RxID:   [id number]

## 2010-05-12 NOTE — Letter (Signed)
Summary: [facility]  [facility]   Imported By: Sherian Rein 03/18/2010 13:59:55  _____________________________________________________________________  External Attachment:    Type:   Image     Comment:   External Document

## 2010-05-12 NOTE — Letter (Signed)
Summary: Records Dated 04-07-97 thru 10-22-09/[facility]  Records Dated 04-07-97 thru 10-22-09/[facility]   Imported By: Lanelle Bal 02/10/2010 09:41:27  _____________________________________________________________________  External Attachment:    Type:   Image     Comment:   External Document

## 2010-05-12 NOTE — Assessment & Plan Note (Signed)
Summary: Pulmonary Consultation   Copy to:  Sandford Craze Primary Provider/Referring Provider:  Lemont Fillers FNP  CC:  Pulmonary Consult for DOE and cough..  History of Present Illness: Pulmonary Consultation [phone] WF with severe persistent asthma.  Pt referred for second opinion. Primary Pulm MD in [place]  Pt Dx  age 50 asthma severe .  Pt followed over time by Kozlow/chodri.    Pt notes chodri focused on sleep apnea.  Pt on chronic steroids and developed adrenal insufficency.  Pt gradually weaned off pred and hydrocortisone per [place].  Pt off steroids now for one month  Pt notes started pred 07/2009 with chodri and then stopped after seeing sullivan for two weeks one month ago.  Pt has clear mucus, cough , dyspneic with exertion. The pt is   ok sitting still  at rest.  There are no at bedtime symptoms Pt on 2L at bedtime . The pt with osa d and  could not tolerate cpap.   Per Allergy kozlow :  skin testing only pos dust mites,  all other tests neg. The rast blood test pos oak and cats  The pt states she has no sneeze or watery eyes Pt notes that  strong smells will ppt symptoms There is no heartburn on two times a day ppi.   There was CT scan of sinuses  and showed sinusitis in the past Pt on alvesco and dulera since 4/11.  Pt the is unsure ? 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 Other meds tried in the past: Serevent, advair, accolate, flovent 220, pulmicort, allegra, pulse pred recurrent, more recently dulera, alvesco,   Asthma History    Initial Asthma Severity Rating:    Age range: 12+ years    Symptoms: throughout the day    Nighttime Awakenings: >1/week but not nightly    Interferes w/ normal activity: extreme limitations    SABA use (not for EIB): several times per day    Exacerbations requiring  oral systemic steroids: 2 or more/year    Asthma Severity Assessment: Severe Persistent   Preventive Screening-Counseling & Management  Alcohol-Tobacco     Smoking Status: never     Passive Smoke Exposure: no  Current Medications (verified): 1)  Alvesco 160 Mcg/act Aers (Ciclesonide) .... 2 Puffs Two Times A Day. 2)  Dulera 200-5 Mcg/act Aero (Mometasone Furo-Formoterol Fum) .... 2 Puffs Two Times A Day. 3)  Vitamin D (Ergocalciferol) 50000 Unit Caps (Ergocalciferol) .... Take 1 Tablet Once A Week. 4)  Losartan Potassium 50 Mg Tabs (Losartan Potassium) .... Take 1 Tablet By Mouth Once A Day 5)  Pantoprazole Sodium 40 Mg Tbec (Pantoprazole Sodium) .... Take 1 Tablet By Mouth Two Times A Day. 6)  Tessalon Perles 100 Mg Caps (Benzonatate) .... Take 1 Capsule By Mouth Three Times A Day. 7)  Synthroid 75 Mcg Tabs (Levothyroxine Sodium) .... Take 1 Tablet By Mouth Once A Day. 8)  Astepro 0.15 % Soln (Azelastine Hcl) .... 2 Sprays Each Nostil  Twice Daily 9)  Oxygen .... 2l/min At Bedtime. 10)  Nystatin 100000 Unit/ml Susp (Nystatin) .... One Teaspoon Swish and Swallow 4 Times Daily Until Symptoms Resolved 11)  Proair Hfa 108 (90 Base) Mcg/act Aers (Albuterol Sulfate) .... As Needed. 12)  Furosemide 20 Mg Tabs (Furosemide) .... One Tablet By Mouth Once Daily As  Needed For Swelling 13)  Albuterol Sulfate (2.5 Mg/48ml) 0.083% Nebu (Albuterol Sulfate) .... One Nebulizer Every 6 Hours As Needed For Wheezing.  Allergies (verified): 1)  ! Theophylline  Past History:  Past medical, surgical, family and social histories (including risk factors) reviewed, and no changes noted (except as noted below).  Past Medical History: Asthma HTN Hx of blood clot right leg--2007 Hypothyroidism Enlarged Heart sleep apnea? carpal tunnel--right hand  Past Surgical History: Reviewed history from 01/16/2010 and no changes required. Right knee surgery--2007 Sinus surgery-- ? year  Family History: Reviewed  history from 01/16/2010 and no changes required. Mother-- deceased at 41, alzheimers, CHF, HTN Father-- deceased, MI at age 47, alzheimer, HTN 1 brother-- MI at 16, living 1 brother-- ?colon cancer- he is in [place], about 12 years older than pt.  1 brother-- deceased, HTN, schizophrenic, died at age 57 MVA 1 sister-- 20 years older than patient- diabetes, skin cancer, HTN, hypercholesterolemia 1 daughter-- HTN, carpal tunnel, bone spurs, bulging discs  Social History: Reviewed history from 01/16/2010 and no changes required. Occupation: Catheter assembly GED Married Never Smoked Alcohol use-no Drug use-no Regular exercise-no 1 daughter Passive Smoke Exposure:  no  Review of Systems       The patient complains of shortness of breath with activity, productive cough, non-productive cough, irregular heartbeats, acid heartburn, indigestion, loss of appetite, weight change, hand/feet swelling, and joint stiffness or pain.  The patient denies shortness of breath at rest, coughing up blood, chest pain, abdominal pain, difficulty swallowing, sore throat, tooth/dental problems, headaches, nasal congestion/difficulty breathing through nose, sneezing, itching, ear ache, anxiety, depression, rash, change in color of mucus, and fever.    Vital Signs:  Patient profile:   50 year old female Menstrual status:  irregular, menopause Height:      61 inches Weight:      297 pounds BMI:     56.32 O2 Sat:      96 % on Room air Temp:     97.6 degrees F oral Pulse rate:   93 / minute BP sitting:   140 / 80  (left arm) Cuff size:   large  Vitals Entered By: Gweneth Dimitri RN (January 29, 2010 9:59 AM)  O2 Flow:  Room air CC: Pulmonary Consult for DOE and cough. Comments Medications reviewed with patient Daytime contact number verified with patient. Gweneth Dimitri RN  January 29, 2010 9:59 AM    Physical Exam  Additional Exam:  Gen: Pleasant, well-nourished, in no distress,  normal affect ENT:  No lesions,  mouth clear,  oropharynx clear, no postnasal drip Neck: No JVD, no TMG, no carotid bruits Lungs: No use of accessory muscles, no dullness to percussion, exp wheeze, poor air flow Cardiovascular: RRR, heart sounds normal, no murmur or gallops, no peripheral edema Abdomen: soft and NT, no HSM,  BS normal Musculoskeletal: No deformities, no cyanosis or clubbing Neuro: alert, non focal Skin: Warm, no lesions or rashes    CXR  Procedure date:  12/26/2009  Findings:      RML infiltrate   Pulmonary Function Test Date: 01/14/2010 Gender: Female  Pre-Spirometry FVC    Value: 1.83 L/min   Pred: 3.29 L/min     % Pred: 55 % FEV1    Value: 1.24 L     Pred: 2.82 L     % Pred: 47 % FEV1/FVC  Value: 67 %     Pred: 80 %    FEF 25-75  Value: 0.77 L/min   Pred:  2.87 L/min     % Pred: 29 %  Lung Volumes TLC    Value: 4.64 L   % Pred: 100 % RV    Value: 2.16 L   % Pred: 130 % DLCO    Value: 17.62 %   % Pred: 76 % DLCO/VA  Value: 5.05 %   % Pred: 101 %  Comments: severe obstruction, normal dlco, airtrapping on lung volumes  Impression & Recommendations:  Problem # 1:  OTHER ACUTE SINUSITIS (ICD-461.8) Assessment Deteriorated recurrent pneumonia and sinusitis now likely triggers for recurrent severe persistent asthm plan levaquin x 10days avoid systemic steroids nasal hygiene  The following medications were removed from the medication list:    Azithromycin 250 Mg Tabs (Azithromycin) .Marland Kitchen... Take 1 tablet three times a week. Her updated medication list for this problem includes:    Tessalon Perles 100 Mg Caps (Benzonatate) .Marland Kitchen... Take 1 capsule by mouth three times a day.    Astepro 0.15 % Soln (Azelastine hcl) .Marland Kitchen... 2 sprays each nostil  twice daily    Levaquin 750 Mg Tabs (Levofloxacin) ..... One tablet by mouth daily  Orders: New Patient Level V (16109) T-Hypersens Panel (86331/86609-85902) T-IgE (Immunoglobulin E) (60454-09811) T-Fungal Panel Immuno  (86612/[id number]) Radiology Referral (Radiology)  Problem # 2:  ASTHMA (ICD-493.90) Assessment: Deteriorated severe persistent asthma with recent abn pfts plan  peak flow meter training resume spiriva stay on dulera trial zyflo, note normal baseline lfts obtain ct sinuses>>>note insurance company denied care so may not be able to image sinuses at this time  Problem # 3:  GERD, SEVERE (ICD-530.81) Assessment: Deteriorated severe gerd likely playing a role here plan  protonix generic daily  refux diet Her updated medication list for this problem includes:    Pantoprazole Sodium 40 Mg Tbec (Pantoprazole sodium) .Marland Kitchen... Take 1 tablet by mouth two times a day.  Medications Added to Medication List This Visit: 1)  Losartan Potassium 50 Mg Tabs (Losartan potassium) .... Take 1 tablet by mouth once a day 2)  Zyflo Cr 600 Mg Tb12 (Zileuton) .... Two tablets  by mouth twice daily 3)  Spiriva Handihaler 18 Mcg Caps (Tiotropium bromide monohydrate) .... Two puffs in handihaler daily 4)  Levaquin 750 Mg Tabs (Levofloxacin) .... One tablet by mouth daily  Complete Medication List: 1)  Alvesco 160 Mcg/act Aers (Ciclesonide) .... 2 puffs two times a day. 2)  Dulera 200-5 Mcg/act Aero (Mometasone furo-formoterol fum) .... 2 puffs two times a day. 3)  Vitamin D (ergocalciferol) 50000 Unit Caps (Ergocalciferol) .... Take 1 tablet once a week. 4)  Losartan Potassium 50 Mg Tabs (Losartan potassium) .... Take 1 tablet by mouth once a day 5)  Pantoprazole Sodium 40 Mg Tbec (Pantoprazole sodium) .... Take 1 tablet by mouth two times a day. 6)  Tessalon Perles 100 Mg Caps (Benzonatate) .... Take 1 capsule by mouth three times a day. 7)  Synthroid 75 Mcg Tabs (Levothyroxine sodium) .... Take 1 tablet by mouth once a day. 8)  Astepro 0.15 % Soln (Azelastine hcl) .... 2 sprays each nostil  twice daily 9)  Oxygen  .... 2l/min at bedtime. 10)  Nystatin 100000 Unit/ml Susp (Nystatin) .... One teaspoon swish  and swallow 4 times daily until symptoms resolved 11)  Proair Hfa 108 (90 Base) Mcg/act Aers (Albuterol sulfate) .... As needed. 12)  Furosemide 20 Mg Tabs (Furosemide) .... One tablet by mouth once daily as needed for swelling 13)  Albuterol Sulfate (2.5 Mg/45ml) 0.083% Nebu (Albuterol sulfate) .... One  nebulizer every 6 hours as needed for wheezing. 14)  Zyflo Cr 600 Mg Tb12 (Zileuton) .... Two tablets  by mouth twice daily 15)  Spiriva Handihaler 18 Mcg Caps (Tiotropium bromide monohydrate) .... Two puffs in handihaler daily 16)  Levaquin 750 Mg Tabs (Levofloxacin) .... One tablet by mouth daily  Other Orders: TLB-Hepatic/Liver Function Pnl (80076-HEPATIC)  Patient Instructions: 1)  Restart Spiriva daily 2)  Stay on Dulera and Alvesco 3)  Start Zyflo twice daily 2 tablets 4)  Labs today 5)  Levaquin 750mg  daily for 10days 6)  CT scan of sinuses 7)  Reflux diet 8)  Use protonix 1/2 hour before meals twice daily 9)  Use the NeilMed nasal rinse at least daily washing out both nares thoroughly.  Place one packet of Sinus Wash ingredients into the nasal wash bottle then fill to the dotted line with lukewarm tap water.  Lean over the sink and rinse each nostril out thoroughly and avoid letting the rinse go into the throat.   10)  Return one month [place]  Prescriptions: LEVAQUIN 750 MG  TABS (LEVOFLOXACIN) One tablet by mouth daily  #10 x 0   Entered and Authorized by:   Storm Frisk MD   Signed by:   Storm Frisk MD on 01/29/2010   Method used:   Electronically to        [facility]  [address] [phone]* (retail)       [address]       [place], [place]  [address]       Ph: [phone] or [phone]       Fax: [phone]   RxID:   [id number] SPIRIVA HANDIHALER 18 MCG  CAPS (TIOTROPIUM BROMIDE MONOHYDRATE) Two puffs in handihaler daily  #30 x 6   Entered and Authorized by:   Storm Frisk MD   Signed by:   Storm Frisk MD on 01/29/2010   Method used:    Print then Give to Patient   RxID:   [id number] ZYFLO CR 600 MG  TB12 (ZILEUTON) Two tablets  by mouth twice daily  #1 month x 6   Entered and Authorized by:   Storm Frisk MD   Signed by:   Storm Frisk MD on 01/29/2010   Method used:   Print then Give to Patient   RxID:   [id number]    escribe currently down.  [facility], spoke with [facility].  She verified they did not receive the Levaquin rx.  Gave verbal for this as stated in above rx per PW.  Heather verbalized understanding.  Gweneth Dimitri RN  January 29, 2010 12:43 PM    Appended Document: Pulmonary Consultation fax Bar[facility] Drain

## 2010-05-12 NOTE — Progress Notes (Signed)
Summary: samples  Phone Note Call from Patient   Caller: Patient Call For: wright Summary of Call: pt would like samples of zyflo Initial call taken by: Rickard Patience,  January 30, 2010 9:31 AM  Follow-up for Phone Call        No samples available at this time.  Spoke with pt and notified. Follow-up by: Vernie Murders,  January 30, 2010 9:52 AM

## 2010-05-12 NOTE — Progress Notes (Signed)
Summary: Pulomary referral  Phone Note Call from Patient Call back at [phone]   Caller: Daughter Reason for Call: Referral Summary of Call: Daughter states pt is not getting any better, is wheezing, breathing shallow and seems like she has a rattle in her chest, pt would like to see pulmonary doctor ASAP, she wants to come to [facility]  Initial call taken by: Lannette Donath,  January 21, 2010 1:38 PM  Follow-up for Phone Call        Called patient.  She denies acute respiratory distress.  She does feel DOE.  Recommended that she come in this afternoon to be seen.  She declined due to her work schedule. She has apt on Friday.  "I think I can make it until friday."  Recommended to patient, that if her symptoms worsen in the mean time she should go to ED for evaluation.  She verbalized understanding.  She is already scheduled for an upcoming apt with Dr. Vassie Loll. Follow-up by: Lemont Fillers FNP,  January 21, 2010 4:05 PM

## 2010-05-12 NOTE — Assessment & Plan Note (Signed)
Summary: 1 week fu/dt--Rm 5   Vital Signs:  Patient profile:   50 year old female Menstrual status:  irregular, menopause Height:      62 inches Weight:      298 pounds BMI:     54.70 Temp:     97.5 degrees F oral Pulse rate:   112 / minute Pulse rhythm:   regular Resp:     18 per minute BP sitting:   136 / 82  (right arm) Cuff size:   large  Vitals Entered By: Mervin Kung CMA Duncan Dull) (January 23, 2010 10:17 AM) CC: Rm 5  1 week f/u.  Pt has brought records from Dr. Leslie Dales and previous pulmonologist. Is Patient Diabetic? No Comments Pt agrees all med doses and directions are correct. Nicki Guadalajara Fergerson CMA (AAMA)  January 23, 2010 10:23 AM    CC:  Rm 5  1 week f/u.  Pt has brought records from Dr. Leslie Dales and previous pulmonologist..  History of Present Illness: Ms Reimann is a 50 year old female who presents today for follow up.    appetite- comes and goes,  denies nausea,  had some sore joints and muscles.  1.  Adrenal insufficiency-  Patient underwent a Cortrosyn Stimulation test last week with Dr. 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  He felt at this point that her options at this point are as follows: Remain off glucocorticoid therapy, resume a low dose prednisone taper starting at 5mg  daily and decreasing by one milligram a month if she is symptomatic.  She does describe malaise, appetite "comes and goes."  Denies nausea.  She has had some joint and muscle soreness.  Voice is hoarse.    2. Recent pneumonia- Pt brings with her today incomplete records from Dr. Bryson Ha office.  This includes a chest x-ray from September 16th which showed a RML infiltrate.  She tells me that she had a follow up CXR (results unavailable to me) that showed a persistent pneumonia.  Breathing- still short of breath.  Still coughing.  Sometimes wet, sometimes dry.  +malaise.  Cough has been x 1 year.    3.  Thrush-  patient has been using nystatin.  4.  Cellulitis-  patient notes resolution of redness.  Swelling is improved.  Has not been using furosemide.  Allergies: 1)  ! Theophylline  Past History:  Past Medical History: Last updated: 01/16/2010 Asthma HTN Hx of blood clot right leg--2007 Hypothyroidism? Enlarged Heart sleep apnea? carpal tunnel--right hand  Past Surgical History: Last updated: 01/16/2010 Right knee surgery--2007 Sinus surgery-- ? year  Review of Systems       see HPI  Physical Exam  General:  Morbidly obese white female awake, alert, and in NAD Head:  Normocephalic and atraumatic without obvious abnormalities. No apparent alopecia or balding. Mouth:  Oral mucosa and oropharynx without lesions or exudates.  Teeth in good repair. Lungs:  normal respiratory effort and no intercostal retractions.  Soft expiratory wheeze.   Heart:  Normal rate and regular rhythm. S1 and S2 normal without gallop, murmur, click, rub or other extra sounds. Extremities:  2+ left pedal edema and 2+ right pedal edema.  Mild induration at base of left calf.  No erythema.   Psych:  Cognition and judgment appear intact. Alert and cooperative with normal attention span and concentration. No apparent delusions, illusions, hallucinations   Impression & Recommendations:  Problem # 1:  DYSPNEA ON EXERTION (ICD-786.09) Assessment Unchanged  Patient with chronic cough and DOE.  LE doppler was negative for DVT however patient has prior history of DVT.  CXR performed today notes Abnormal density in the right middle lobe and probably in the left lower lobe.   The patient just completed a week of Ceftin.  Will plan to send patient for a CTA of the chest to rule out PE and to further evaluated these areas.  She is scheduled to establish with Dr. Vassie Loll.   Her updated medication list for this problem includes:    Alvesco 160 Mcg/act Aers (Ciclesonide)  .Marland Kitchen... 2 puffs two times a day.    Dulera 200-5 Mcg/act Aero (Mometasone furo-formoterol fum) .Marland Kitchen... 2 puffs two times a day.    Proair Hfa 108 (90 Base) Mcg/act Aers (Albuterol sulfate) .Marland Kitchen... As needed.    Furosemide 20 Mg Tabs (Furosemide) ..... One tablet by mouth once daily as needed for swelling    Albuterol Sulfate (2.5 Mg/75ml) 0.083% Nebu (Albuterol sulfate) ..... One nebulizer every 6 hours as needed for wheezing.  Orders: Misc. Referral (Misc. Ref)  Problem # 2:  COUGH, CHRONIC (ICD-786.2) Will try off of ARB and give trial of CCB instead to see if this helps her cough.  Will also add astepro to see if this improves cough and "hoarsness."  She is already on a PPI.    Problem # 3:  ADRENAL INSUFFICIENCY, HX OF (ICD-V13.8) Assessment: Comment Only Will plan to continue inhaled steroids only at this time.  Will defer resuming steroids to Dr. Vassie Loll if he feels that this is indicated from a pulmonary standpoint.  Pt's BP is stable.   Problem # 4:  CANDIDIASIS, ORAL (ICD-112.0) Assessment: Improved Resolved.  Problem # 5:  CELLULITIS, LEG, RIGHT (ICD-682.6) Assessment: Comment Only  Erythema is resolved.  Induration is likely due to chronic venous stasis at this point.  This was explained to the patient. The following medications were removed from the medication list:    Ceftin 500 Mg Tabs (Cefuroxime axetil) ..... One tablet by mouth two times a day x 7 days Her updated medication list for this problem includes:    Azithromycin 250 Mg Tabs (Azithromycin) .Marland Kitchen... Take 1 tablet three times a week.  The following medications were removed from the medication list:    Ceftin 500 Mg Tabs (Cefuroxime axetil) ..... One tablet by mouth two times a day x 7 days Her updated medication list for this problem includes:    Azithromycin 250 Mg Tabs (Azithromycin) .Marland Kitchen... Take 1 tablet three times a week.  Complete Medication List: 1)  Alvesco 160 Mcg/act Aers (Ciclesonide) .... 2 puffs two times a  day. 2)  Dulera 200-5 Mcg/act Aero (Mometasone furo-formoterol fum) .... 2 puffs two times a day. 3)  Vitamin D (ergocalciferol) 50000 Unit Caps (Ergocalciferol) .... Take 1 tablet once a week. 4)  Amlodipine Besylate 5 Mg Tabs (Amlodipine besylate) .... One tablet by mouth once daily 5)  Pantoprazole Sodium 40 Mg Tbec (Pantoprazole sodium) .... Take 1 tablet by mouth two times a day. 6)  Tessalon Perles 100 Mg Caps (Benzonatate) .... Take 1 capsule by mouth three times a day. 7)  Synthroid 75 Mcg Tabs (Levothyroxine sodium) .... Take 1 tablet by mouth once a day. 8)  Proair Hfa 108 (90 Base) Mcg/act Aers (Albuterol sulfate) .... As needed. 9)  Oxygen  .... 2l/min at bedtime. 10)  Azithromycin 250 Mg Tabs (Azithromycin) .... Take 1 tablet  three times a week. 11)  Furosemide 20 Mg Tabs (Furosemide) .... One tablet by mouth once daily as needed for swelling 12)  Albuterol Sulfate (2.5 Mg/29ml) 0.083% Nebu (Albuterol sulfate) .... One nebulizer every 6 hours as needed for wheezing. 13)  Nystatin 100000 Unit/ml Susp (Nystatin) .... One teaspoon swish and swallow 4 times daily until symptoms resolved 14)  Astepro 0.15 % Soln (Azelastine hcl) .... 2 sprays each nostil  twice daily  Other Orders: CXR- 2view (CXR)  Patient Instructions: 1)  Please complete your chest x-ray today on the first floor. 2)  Follow up with Dr. Vassie Loll as scheduled. 3)  I will call you with the results of you chest x-ray and to discuss the plans for future steroid therapy.  4)  Please schedule a physical in 1 month.  Come fasting to this appointment.   Prescriptions: AMLODIPINE BESYLATE 5 MG TABS (AMLODIPINE BESYLATE) one tablet by mouth once daily  #30 x 1   Entered and Authorized by:   Lemont Fillers FNP   Signed by:   Lemont Fillers FNP on 01/23/2010   Method used:   [facility]        [facility]  [address] [phone][facility] (retail)       [address]       [place], [place]  [address]       Ph:  [phone] or [phone]       Fax: [phone]   RxID:   [id number] ASTEPRO 0.15 % SOLN (AZELASTINE HCL) 2 sprays each nostil  twice daily  #1 x 2   Entered and Authorized by:   Lemont Fillers FNP   Signed by:   Lemont Fillers FNP on 01/23/2010   Method used:   [facility]        [facility]  [address] [phone][facility] (retail[facility]       [address]       [place], [place]  [address]       Ph: [phone] or [phone]       Fax: [phone]   RxID:   [id number]   Current Allergies (reviewed today): ! THEOPHYLLINE

## 2010-05-12 NOTE — Miscellaneous (Signed)
  Clinical Lists Changes  Problems: Added new problem of UNSPECIFIED VITAMIN D DEFICIENCY (ICD-268.9) Removed problem of NONSPECIFIC ABNORMAL TOXICOLOGICAL FINDINGS (ICD-796.0) Removed problem of CANDIDIASIS, ORAL (ICD-112.0) Removed problem of PNEUMONIA (ICD-486) Removed problem of CELLULITIS, LEG, RIGHT (ICD-682.6)

## 2010-05-12 NOTE — Letter (Signed)
Summary: Laurette Schimke MD  Laurette Schimke MD   Imported By: Sherian Rein 02/05/2010 08:43:15  _____________________________________________________________________  External Attachment:    Type:   Image     Comment:   External Document

## 2010-05-14 NOTE — Progress Notes (Signed)
Summary: sample  Phone Note Call from Patient Call back at Home Phone [phone]   Caller: Patient Call For: wright Reason for Call: Talk to Nurse Summary of Call: Patient asking for sample of alvesco 160mg . Initial call taken by: Lehman Prom,  April 17, 2010 9:25 AM  Follow-up for Phone Call        [facility] for pt that sample was left at front desk for pick up. Abigail Miyamoto RN  April 17, 2010 10:16 AM

## 2010-05-14 NOTE — Assessment & Plan Note (Signed)
Summary: Pulmonary OV   Copy to:  Sandford Craze Primary Provider/Referring Provider:  Lemont Fillers FNP  CC:  Follow up.  Pt states breathing is doing good however cough has returned x 4 days.  Prod with cloudy mucus.  Denies wheezing and chest tightness.Marland Kitchen  History of Present Illness: Pulmonary OV 49yo WF with severe persistent asthma, chronic sinusitis, severe atopy.  Pt referred for second opinion. Primary Pulm MD in [facility]   February 26, 2010 11:53 AM This pt is not any better.  The pt still with the cough. The pt  saw ENT. Pt was rx:   saline rx two times a day and  levaquin rx for two weeks The pt is due to go back to ENT 11.29. There is a ? repeat surgery on sinuses The notes still with prod cough, mucus is thick, no color.  The pt stil with exp wheezes and dyspnea.  No chest pain.   April 24, 2010 12:14 PM Pt did not have sinus surgery . Pt is still symptomatic Still with dyspnea and wheeze.   Asthma History    Asthma Control Assessment:    Age range: 12+ years    Symptoms: throughout the day    Nighttime Awakenings: 1-3/week    Interferes w/ normal activity: some limitations    SABA use (not for EIB): >2 days/week    ATAQ questionnaire: 1-2    FEV1: 1.24 liters (today)    FEV1 Pred: 2.82 liters (today)    Exacerbations requiring oral systemic steroids: 2 or more/year    Asthma Control Assessment: Very Poorly Controlled   Preventive Screening-Counseling & Management  Alcohol-Tobacco     Alcohol drinks/day: 0     Smoking Status: never     Passive Smoke Exposure: no  Current Medications (verified): 1)  Alvesco 160 Mcg/act Aers (Ciclesonide) .... 2 Puffs Two Times A Day. 2)  Dulera 200-5 Mcg/act Aero (Mometasone Furo-Formoterol Fum) .... 2 Puffs Two Times A Day. 3)  Losartan Potassium 50 Mg Tabs (Losartan Potassium) .... Take 1 Tablet By Mouth Once A Day 4)  Pantoprazole Sodium 40 Mg Tbec (Pantoprazole Sodium) .... Take 1 Tablet By Mouth Two Times  A Day. 5)  Tessalon Perles 100 Mg Caps (Benzonatate) .... Take 1 Capsule By Mouth Three Times A Day As Needed 6)  Synthroid 75 Mcg Tabs (Levothyroxine Sodium) .... Take 1 Tablet By Mouth Once A Day. 7)  Astepro 0.15 % Soln (Azelastine Hcl) .... 2 Sprays Each Nostil  Twice Daily 8)  Oxygen .... 2l/min At Bedtime. 9)  Proair Hfa 108 (90 Base) Mcg/act Aers (Albuterol Sulfate) .... As Needed. 10)  Albuterol Sulfate (2.5 Mg/49ml) 0.083% Nebu (Albuterol Sulfate) .... One Nebulizer Every 6 Hours As Needed For Wheezing. 11)  Zyflo Cr 600 Mg  Tb12 (Zileuton) .... Two Tablets  By Mouth Twice Daily 12)  Spiriva Handihaler 18 Mcg  Caps (Tiotropium Bromide Monohydrate) .... Two Puffs in Handihaler Daily  Allergies (verified): 1)  ! Theophylline  Past History:  Past medical, surgical, family and social histories (including risk factors) reviewed, and no changes noted (except as noted below).  Past Medical History: Reviewed history from 01/29/2010 and no changes required. Asthma HTN Hx of blood clot right leg--2007 Hypothyroidism Enlarged Heart sleep apnea? carpal tunnel--right hand  Past Surgical History: Reviewed history from 01/16/2010 and no changes required. Right knee surgery--2007 Sinus surgery-- ? year  Family History: Reviewed history from 01/16/2010 and no changes required. Mother-- deceased at 11, alzheimers, CHF, HTN Father--  deceased, MI at age 71, alzheimer, HTN 1 brother-- MI at 66, living 1 brother-- ?colon cancer- he is in [place], about 12 years older than pt.  1 brother-- deceased, HTN, schizophrenic, died at age 20 MVA 1 sister-- 20 years older than patient- diabetes, skin cancer, HTN, hypercholesterolemia 1 daughter-- HTN, carpal tunnel, bone spurs, bulging discs  Social History: Reviewed history from 01/29/2010 and no changes required. Occupation: Catheter assembly GED Married Never Smoked Alcohol use-no Drug use-no Regular exercise-no 1 daughter  Review of  Systems       The patient complains of shortness of breath with activity, shortness of breath at rest, productive cough, non-productive cough, nasal congestion/difficulty breathing through nose, and change in color of mucus.  The patient denies coughing up blood, chest pain, irregular heartbeats, acid heartburn, indigestion, loss of appetite, weight change, abdominal pain, difficulty swallowing, sore throat, tooth/dental problems, headaches, sneezing, itching, ear ache, anxiety, depression, hand/feet swelling, joint stiffness or pain, rash, and fever.    Vital Signs:  Patient profile:   50 year old female Menstrual status:  irregular, menopause Height:      61 inches Weight:      301.13 pounds BMI:     57.10 O2 Sat:      98 % on Room air Temp:     98.0 degrees F oral Pulse rate:   87 / minute BP sitting:   150 / 88  (left arm) Cuff size:   large  Vitals Entered By: Gweneth Dimitri RN (April 24, 2010 11:29 AM)  O2 Flow:  Room air CC: Follow up.  Pt states breathing is doing good however cough has returned x 4 days.  Prod with cloudy mucus.  Denies wheezing and chest tightness. Comments Medications reviewed with patient Daytime contact number verified with patient. Gweneth Dimitri RN  April 24, 2010 11:31 AM    Physical Exam  Additional Exam:  Gen: Pleasant, well-nourished, in no distress,  normal affect ZOX:WRUEA with purulent secretions bot hnares  Neck: No JVD, no TMG, no carotid bruits Lungs: No use of accessory muscles, no dullness to percussion, exp wheeze, poor air flow Cardiovascular: RRR, heart sounds normal, no murmur or gallops, no peripheral edema Abdomen: soft and NT, no HSM,  BS normal Musculoskeletal: No deformities, no cyanosis or clubbing Neuro: alert, non focal Skin: Warm, no lesions or rashes    Pre-Spirometry FEV1    Value: 1.24 L     Pred: 2.82 L     Impression & Recommendations:  Problem # 1:  OTHER ACUTE SINUSITIS (ICD-461.8) Assessment  Deteriorated persistent sinusitis.pt did not have sinus surgery plan prolonged medical rx of sinuses  Prednisone 10mg  4 each am x 4 days, 3 x 4 days, 2 x 4 days, 1 x 4 days then stop Augmentin twice daily for 14 days No other changes Return 1 month The following medications were removed from the medication list:    Ceftin 500 Mg Tabs (Cefuroxime axetil) ..... One tablet by mouth two times a day for 7 days Her updated medication list for this problem includes:    Tessalon Perles 100 Mg Caps (Benzonatate) .Marland Kitchen... Take 1 capsule by mouth three times a day as needed    Astepro 0.15 % Soln (Azelastine hcl) .Marland Kitchen... 2 sprays each nostil  twice daily    Amoxicillin-pot Clavulanate 875-125 Mg Tabs (Amoxicillin-pot clavulanate) ..... One by mouth two times a day  Orders: Est. Patient Level IV (54098)  Problem # 2:  ASTHMA (ICD-493.90) Assessment: Deteriorated  severe persistent asthma with persistent sinusitis driving ongoing lower airway inflammation plan  No change in inhaled medications.   Maintain treatment program as currently prescribed. see assess one   Medications Added to Medication List This Visit: 1)  Tessalon Perles 100 Mg Caps (Benzonatate) .... Take 1 capsule by mouth three times a day as needed 2)  Prednisone 10 Mg Tabs (Prednisone) .... Take as directed 4 each am x 4 days, 3 x 4 days, 2 x 4 days, 1 x 4 days then stop 3)  Amoxicillin-pot Clavulanate 875-125 Mg Tabs (Amoxicillin-pot clavulanate) .... One by mouth two times a day  Complete Medication List: 1)  Alvesco 160 Mcg/act Aers (Ciclesonide) .... 2 puffs two times a day. 2)  Dulera 200-5 Mcg/act Aero (Mometasone furo-formoterol fum) .... 2 puffs two times a day. 3)  Losartan Potassium 50 Mg Tabs (Losartan potassium) .... Take 1 tablet by mouth once a day 4)  Pantoprazole Sodium 40 Mg Tbec (Pantoprazole sodium) .... Take 1 tablet by mouth two times a day. 5)  Tessalon Perles 100 Mg Caps (Benzonatate) .... Take 1 capsule by  mouth three times a day as needed 6)  Synthroid 75 Mcg Tabs (Levothyroxine sodium) .... Take 1 tablet by mouth once a day. 7)  Astepro 0.15 % Soln (Azelastine hcl) .... 2 sprays each nostil  twice daily 8)  Oxygen  .... 2l/min at bedtime. 9)  Proair Hfa 108 (90 Base) Mcg/act Aers (Albuterol sulfate) .... As needed. 10)  Albuterol Sulfate (2.5 Mg/36ml) 0.083% Nebu (Albuterol sulfate) .... One nebulizer every 6 hours as needed for wheezing. 11)  Zyflo Cr 600 Mg Tb12 (Zileuton) .... Two tablets  by mouth twice daily 12)  Spiriva Handihaler 18 Mcg Caps (Tiotropium bromide monohydrate) .... Two puffs in handihaler daily 13)  Prednisone 10 Mg Tabs (Prednisone) .... Take as directed 4 each am x 4 days, 3 x 4 days, 2 x 4 days, 1 x 4 days then stop 14)  Amoxicillin-pot Clavulanate 875-125 Mg Tabs (Amoxicillin-pot clavulanate) .... One by mouth two times a day  Patient Instructions: 1)  Prednisone 10mg  4 each am x 4 days, 3 x 4 days, 2 x 4 days, 1 x 4 days then stop 2)  Augmentin twice daily for 14 days 3)  No other changes 4)  Return 1 month Prescriptions: AMOXICILLIN-POT CLAVULANATE 875-125 MG TABS (AMOXICILLIN-POT CLAVULANATE) one by mouth two times a day  #28 x 0   Entered and Authorized by:   Storm Frisk MD   Signed by:   Storm Frisk MD on 04/24/2010   Method used:   [facility]        [facility]  [address] [phone]* (retail)       [address]       [place], [place]  [address]       Ph: [phone] or [phone]       Fax: [phone]   RxID:   [id number] PREDNISONE 10 MG  TABS (PREDNISONE) Take as directed 4 each am x 4 days, 3 x 4 days, 2 x 4 days, 1 x 4 days then stop  #40 x 0   Entered and Authorized by:   Storm Frisk MD   Signed by:   Storm Frisk MD on 04/24/2010   Method used:   [facility]        [facility]  [address] [phone]* (retail[facility]  [address]       [place], [place]  [address]       Ph: [phone] or [phone]        Fax: [phone]   RxID:   [id number] ALVESCO 160 MCG/ACT AERS (CICLESONIDE) 2 puffs two times a day.  #90 day supp x 4   Entered and Authorized by:   Storm Frisk MD   Signed by:   Storm Frisk MD on 04/24/2010   Method used:   Print then Give to Patient   RxID:   [id number]

## 2010-05-14 NOTE — Progress Notes (Signed)
Summary: ?UTI  Phone Note Call from Patient Call back at [phone] Can leave message on machine.   Caller: Patient Call For: Lemont Fillers FNP Summary of Call: Pt called stating she went for pre-assessment for surgery. They found she has a UTI. She states they gave her something there and told  her to contact us to get the remainder of her treatment?Marland Kitchen  Attempted to contact pt for clarification of what she was given and what quantity. Left message for pt to return my call.   Initial call taken by: Mervin Kung CMA Duncan Dull),  April 10, 2010 4:29 PM  Follow-up for Phone Call        Spoke to pt and she states that she was given some medication via IV when she went to the hospital for the surgery on Friday. She didn't know the name. UA report has been printed and forwarded to [facility] for review. Pt also states that ENT decided she did not need surgery after all.  Please advise if abx needs to be called in for her UTI. Nicki Guadalajara Fergerson CMA (AAMA)  April 14, 2010 8:16 AM   Additional Follow-up for Phone Call Additional follow up Details #1::        Rx was sent to pharmacy for Ceftin. Pls notify patient. Additional Follow-up by: Lemont Fillers FNP,  April 14, 2010 8:37 AM    Additional Follow-up for Phone Call Additional follow up Details #2::    Pt notified. Nicki Guadalajara Fergerson CMA (AAMA)  April 14, 2010 9:02 AM   New/Updated Medications: CEFTIN 500 MG TABS (CEFUROXIME AXETIL) one tablet by mouth two times a day for 7 days Prescriptions: CEFTIN 500 MG TABS (CEFUROXIME AXETIL) one tablet by mouth two times a day for 7 days  #14 x 0   Entered and Authorized by:   Lemont Fillers FNP   Signed by:   Lemont Fillers FNP on 04/14/2010   Method used:   [facility]        [facility]  [address] [phone]* (retail)       [address]       [place], [place]  [address]       Ph: [phone] or [phone]       Fax: [phone]   RxID:   [id number]

## 2010-05-14 NOTE — Progress Notes (Signed)
Summary: 90 day spiriva rx  Phone Note Call from Patient Call back at Home Phone [phone]   Caller: patinet Call For: dr Delford Field Complaint: Abdominal Pain Summary of Call: Patient phoned Dr. Delford Field gave her a prescription for Sprivia a 30 day supply. When she went to her pharmacy she found out that she could get a 90 supply for the same cost. She wants to know if her prescription can be changed from a 30 day supply to a 90 day supply. She uses [facility] [place] [phone] and she can be reached at [phone] Initial call taken by: Vedia Coffer,  April 03, 2010 9:18 AM  Follow-up for Phone Call        PW please advise if you are okay with giving a 90 day supply as pt was seen 02-2010 and was told to follow up in 1 month in [facility] office; appt was on 03-26-10(pt cancelled) and there are no pending appts.Thanks.Reynaldo Minium CMA  April 03, 2010 9:33 AM   Additional Follow-up for Phone Call Additional follow up Details #1::        this is ok  pw Additional Follow-up by: Storm Frisk MD,  April 07, 2010 2:26 PM    Additional Follow-up for Phone Call Additional follow up Details #2::    Called, spoke with pt.  Aware [facility] [place].  She has sinus surgery pending for this coming Thursday.  Will call back to schedule follow up with PW after surgery. Follow-up by: Gweneth Dimitri RN,  April 07, 2010 2:31 PM  New/Updated Medications: SPIRIVA HANDIHALER 18 MCG  CAPS (TIOTROPIUM BROMIDE MONOHYDRATE) Two puffs in handihaler daily Prescriptions: SPIRIVA HANDIHALER 18 MCG  CAPS (TIOTROPIUM BROMIDE MONOHYDRATE) Two puffs in handihaler daily  #90 x 1   Entered by:   Gweneth Dimitri RN   Authorized by:   Storm Frisk MD   Signed by:   Gweneth Dimitri RN on 04/07/2010   Method used:   [facility]        [facility]  [address] [phone]* (retail)       [address]       [place], [place]  [address]       Ph: [phone] or [phone]       Fax:  [phone]   RxID:   [id number]   Appended Document: 90 day spiriva rx [facility] Herbert Seta.  She is aware to void Spiriva rx sent in Oct 2011 for 30 days and replace it with 90 day rx sent in today.  She verbalized understanding.

## 2010-05-15 ENCOUNTER — Encounter: Payer: Self-pay | Admitting: Family

## 2010-05-15 ENCOUNTER — Ambulatory Visit: Payer: Self-pay | Admitting: Family

## 2010-05-15 ENCOUNTER — Ambulatory Visit (INDEPENDENT_AMBULATORY_CARE_PROVIDER_SITE_OTHER): Payer: BC Managed Care – PPO | Admitting: Family

## 2010-05-15 DIAGNOSIS — R059 Cough, unspecified: Secondary | ICD-10-CM

## 2010-05-15 DIAGNOSIS — I10 Essential (primary) hypertension: Secondary | ICD-10-CM

## 2010-05-15 DIAGNOSIS — J45909 Unspecified asthma, uncomplicated: Secondary | ICD-10-CM

## 2010-05-15 DIAGNOSIS — D179 Benign lipomatous neoplasm, unspecified: Secondary | ICD-10-CM

## 2010-05-15 DIAGNOSIS — R05 Cough: Secondary | ICD-10-CM

## 2010-05-15 HISTORY — DX: Benign lipomatous neoplasm, unspecified: D17.9

## 2010-05-18 ENCOUNTER — Encounter (INDEPENDENT_AMBULATORY_CARE_PROVIDER_SITE_OTHER): Payer: Self-pay | Admitting: *Deleted

## 2010-05-20 NOTE — Letter (Addendum)
   [place] [facility] [address] [place], [place]  [address]  [place] Phone: [phone]      May 15, 2010   [facility] [address] [place], [place] [address]  RE:  LAB RESULTS  Dear  Ms. Buss,  The following is an interpretation of your most recent lab tests.  Please take note of any instructions provided or changes to medications that have resulted from your lab work.  THYROID STUDIES:  Thyroid studies normal TSH: 2.360     Please continue same dose of your levothyroxine for your thyroid.   Sincerely Yours,    Lemont Fillers FNP  Appended Document:  Mailed.

## 2010-05-20 NOTE — Assessment & Plan Note (Signed)
Summary: follow up-- rm 5   Vital Signs:  Patient profile:   50 year old female Menstrual status:  irregular, menopause Height:      61 inches Weight:      304 pounds BMI:     57.65 Temp:     97.9 degrees F oral Pulse rate:   78 / minute Pulse rhythm:   regular Resp:     16 per minute BP sitting:   130 / 90  (right arm) Cuff size:   thigh  Vitals Entered By: Mervin Kung CMA Duncan Dull) (May 15, 2010 10:03 AM) CC: Pt here for follow up of blood pressure and thyroid. Also wants enlarging, red bump on chest x 1year assessed. Is Patient Diabetic? No Pain Assessment Patient in pain? no      Comments Pt completed Tessalon Perles and Prednisone. Was taken off Asteopro due to it not helping. Stopped Zyflo as it is not needed. Now uses Nasonex 2 sprays once daily. Nicki Guadalajara Fergerson CMA Duncan Dull)  May 15, 2010 10:17 AM    Primary Care Provider:  Lemont Fillers FNP  CC:  Pt here for follow up of blood pressure and thyroid. Also wants enlarging and red bump on chest x 1year assessed.Marland Kitchen  History of Present Illness: Ms.  Silliman is a 50 year old female who presents today for follow up.  1) Cough- notes that this has resoved.  She was evaluated by Dr. Jearld Fenton and was originally scheduled for sinus surgery, however this was cancelled as she was told that her sinuses had "cleared up."  2)OSA-  she is continued on HS oxygen.  Was intolerant of CPAP due to sinus congestion.    3)Asthma- this is improved per patient.  She is currently following with Dr. Delford Field (Pulmonology) and also sees an allergist- Dr. Lucie Leather.  4) HTN- reports that she is tolerating BP meds without difficulty.    Problems Prior to Update: 1)  Unspecified Vitamin D Deficiency  (ICD-268.9) 2)  Gerd, Severe  (ICD-530.81) 3)  Diastolic Dysfunction  (ICD-429.9) 4)  Other Acute Sinusitis  (ICD-461.8) 5)  Hypertension  (ICD-401.9) 6)  Adrenal Insufficiency, Hx of  (ICD-V13.8) 7)  Cough, Chronic  (ICD-786.2) 8)   Dyspnea On Exertion  (ICD-786.09) 9)  Deep Venous Thrombophlebitis, Leg, Right, Hx of  (ICD-V12.52) 10)  Obesity, Morbid  (ICD-278.01) 11)  Sleep Apnea, Obstructive  (ICD-327.23) 12)  Cardiomegaly  (ICD-429.3) 13)  Asthma  (ICD-493.90) 14)  Hypothyroidism  (ICD-244.9) 15)  Edema  (ICD-782.3)  Allergies: 1)  ! Theophylline  Past History:  Past Medical History: Last updated: 01/29/2010 Asthma HTN Hx of blood clot right leg--2007 Hypothyroidism Enlarged Heart sleep apnea? carpal tunnel--right hand  Past Surgical History: Last updated: 01/16/2010 Right knee surgery--2007 Sinus surgery-- ? year  Review of Systems       see HPI  Physical Exam  General:  Well-developed, obese white female, no acute distress; alert,appropriate and cooperative throughout examination Head:  Normocephalic and atraumatic without obvious abnormalities. No apparent alopecia or balding. Eyes:  PERRLA, sclera clear Ears:  External ear exam shows no significant lesions or deformities.  Otoscopic examination reveals clear canals, tympanic membranes are intact bilaterally without bulging, retraction, inflammation or discharge. Hearing is grossly normal bilaterally. Mouth:  Oral mucosa and oropharynx without lesions or exudates.  Teeth in good repair. Neck:  No deformities, masses, or tenderness noted. Chest Wall:  + firm mobile nodule- approximately 1 cm wide, noted on anterior chest was midline, just above  breast.   Lungs:  Normal respiratory effort, chest expands symmetrically. Lungs are clear to auscultation, no crackles or wheezes. Heart:  Normal rate and regular rhythm. S1 and S2 normal without gallop, murmur, click, rub or other extra sounds. Extremities:  No lower extremity edema is noted. Psych:  Cognition and judgment appear intact. Alert and cooperative with normal attention span and concentration. 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  Will refer to surgery for evaluation.  Orders: Misc. Referral (Misc. Ref)  Problem # 2:  HYPERTENSION (ICD-401.9) Assessment: Improved  BP is improved this visit.  Continue the Losartan. Her updated medication list for this problem includes:    Losartan Potassium 50 Mg Tabs (Losartan potassium) .Marland Kitchen... Take 1 tablet by mouth once a day  BP today: 130/90 Prior BP: 150/88 (04/24/2010)  Labs Reviewed: K+: 4.1 (01/16/2010) Creat: : 0.75 (01/16/2010)     Her updated medication list for this problem includes:    Losartan Potassium 50 Mg Tabs (Losartan potassium) .Marland Kitchen... Take 1 tablet by mouth once a day  Problem # 3:  COUGH, CHRONIC (ICD-786.2) Assessment: Improved Reports resolution of cough.  Problem # 4:  ASTHMA (ICD-493.90) Assessment: Improved  Symptoms are improved.  Continue current meds and follow up with pulmonary.  The following medications were removed from the medication list:    Zyflo Cr 600 Mg Tb12 (Zileuton) .Marland Kitchen..Marland Kitchen Two tablets  by mouth twice daily    Prednisone 10 Mg Tabs (Prednisone) .Marland Kitchen... Take as directed 4 each am x 4 days, 3 x 4 days, 2 x 4 days, 1 x 4 days then stop Her updated medication list for this problem includes:    Alvesco 160 Mcg/act Aers (Ciclesonide) .Marland Kitchen... 2 puffs two times a day.    Dulera 200-5 Mcg/act Aero (Mometasone furo-formoterol fum) .Marland Kitchen... 2 puffs two times a day.    Proair Hfa 108 (90 Base) Mcg/act Aers (Albuterol sulfate) .Marland Kitchen... As needed.    Albuterol Sulfate (2.5 Mg/67ml) 0.083% Nebu (Albuterol sulfate) ..... One nebulizer every 6 hours as needed for wheezing.    Spiriva Handihaler 18 Mcg Caps (Tiotropium bromide monohydrate) .Marland Kitchen..Marland Kitchen Two puffs in handihaler daily  The following medications were removed from the medication list:    Zyflo Cr 600 Mg Tb12 (Zileuton) .Marland Kitchen..Marland Kitchen Two tablets  by mouth twice daily    Prednisone 10 Mg Tabs (Prednisone)  .Marland Kitchen... Take as directed 4 each am x 4 days, 3 x 4 days, 2 x 4 days, 1 x 4 days then stop Her updated medication list for this problem includes:    Alvesco 160 Mcg/act Aers (Ciclesonide) .Marland Kitchen... 2 puffs two times a day.    Dulera 200-5 Mcg/act Aero (Mometasone furo-formoterol fum) .Marland Kitchen... 2 puffs two times a day.    Proair Hfa 108 (90 Base) Mcg/act Aers (Albuterol sulfate) .Marland Kitchen... As needed.    Albuterol Sulfate (2.5 Mg/61ml) 0.083% Nebu (Albuterol sulfate) ..... One nebulizer every 6 hours as needed for wheezing.    Spiriva Handihaler 18 Mcg Caps (Tiotropium bromide monohydrate) .Marland Kitchen..Marland Kitchen Two puffs in handihaler daily  Complete Medication List: 1)  Alvesco 160 Mcg/act Aers (Ciclesonide) .... 2 puffs two times a day. 2)  Dulera 200-5 Mcg/act Aero (Mometasone furo-formoterol fum) .... 2 puffs two times a day. 3)  Losartan Potassium 50 Mg Tabs (Losartan potassium) .... Take 1 tablet by mouth once a day 4)  Pantoprazole  Sodium 40 Mg Tbec (Pantoprazole sodium) .... Take 1 tablet by mouth two times a day. 5)  Synthroid 75 Mcg Tabs (Levothyroxine sodium) .... Take 1 tablet by mouth once a day. 6)  Oxygen  .... 2l/min at bedtime. 7)  Proair Hfa 108 (90 Base) Mcg/act Aers (Albuterol sulfate) .... As needed. 8)  Albuterol Sulfate (2.5 Mg/8ml) 0.083% Nebu (Albuterol sulfate) .... One nebulizer every 6 hours as needed for wheezing. 9)  Spiriva Handihaler 18 Mcg Caps (Tiotropium bromide monohydrate) .... Two puffs in handihaler daily 10)  Nasonex 50 Mcg/act Susp (Mometasone furoate) .... 2 sprays each nostril daily.  Other Orders: TLB-TSH (Thyroid Stimulating Hormone) [id number])  Patient Instructions: 1)  Please complete your lab work downstairs today. 2)  Follow up in 3 months, sooner if problems or concerns.   Prescriptions: LOSARTAN POTASSIUM 50 MG TABS (LOSARTAN POTASSIUM) Take 1 tablet by mouth once a day  #30 x 2   Entered and Authorized by:   Lemont Fillers FNP   Signed by:   Lemont Fillers  FNP on 05/15/2010   Method used:   Electronically to        [facility]  [address] [address] [phone]* (retail)       [address]       [place], [place]  [address]       Ph: [phone] or [phone]       Fax: [phone]   RxID:   [id number] PANTOPRAZOLE SODIUM 40 MG TBEC (PANTOPRAZOLE SODIUM) Take 1 tablet by mouth two times a day.  #30 x 2   Entered and Authorized by:   Lemont Fillers FNP   Signed by:   Lemont Fillers FNP on 05/15/2010   Method used:   Electronically to        [facility]  [address] [phone]* (retail)       [address]       [place], [place]  [address]       Ph: [phone] or [phone]       Fax: [phone]   RxID:   [id number] SYNTHROID 75 MCG TABS (LEVOTHYROXINE SODIUM) Take 1 tablet by mouth once a day.  #30 Tablet x 2   Entered and Authorized by:   Lemont Fillers FNP   Signed by:   Lemont Fillers FNP on 05/15/2010   Method used:   Electronically to        [facility]  [address] [phone]* (retail)       [address]       [place], [place]  [address]       Ph: [phone] or [phone]       Fax: [phone]   RxID:   [id number]    Orders Added: 1)  TLB-TSH (Thyroid Stimulating Hormone) [84443-TSH] 2)  Misc. Referral [Misc. Ref] 3)  Est. Patient Level III [[address]]    Current Allergies (reviewed today): ! THEOPHYLLINE

## 2010-05-28 NOTE — Letter (Signed)
Summary: Primary Care Consult Scheduled Letter  [place] [facility]  [address]   [place], [place] [address]   Phone: [phone]  Fax: [phone]      05/18/2010 MRN: [id number]  Vieva Lacson [address] [place], [place]  [address]  [place]    Dear Ms. Teas,      We have scheduled an appointment for you.  At the recommendation of MELISSA O'SULLIVAN,FNP, we have scheduled you a consult with [facility] , DR Wenda Low  on [facility]  at [facility] .  Their address is__[address], [place] . The office phone number is [phone].  If this appointment day and time is not convenient for you, please feel free to call the office of the doctor you are being referred to at the number listed above and reschedule the appointment.     It is important for you to keep your scheduled appointments. We are here to make sure you are given good patient care.    Thank you,  Darral Dash Patient Care Coordinator [place] [facility]

## 2010-06-22 LAB — CBC
MCH: 29.9 pg (ref 26.0–34.0)
MCHC: 33.5 g/dL (ref 30.0–36.0)
MCV: 89.2 fL (ref 78.0–100.0)
Platelets: 265 10*3/uL (ref 150–400)
RDW: 14.3 % (ref 11.5–15.5)

## 2010-06-22 LAB — URINE MICROSCOPIC-ADD ON

## 2010-06-22 LAB — URINALYSIS, ROUTINE W REFLEX MICROSCOPIC
Bilirubin Urine: NEGATIVE
Ketones, ur: NEGATIVE mg/dL
Nitrite: NEGATIVE
Protein, ur: NEGATIVE mg/dL
Urobilinogen, UA: 0.2 mg/dL (ref 0.0–1.0)

## 2010-06-22 LAB — URINE CULTURE
Colony Count: 75000
Culture  Setup Time: 201112271221

## 2010-06-22 LAB — BASIC METABOLIC PANEL
BUN: 11 mg/dL (ref 6–23)
CO2: 29 mEq/L (ref 19–32)
Calcium: 9.7 mg/dL (ref 8.4–10.5)
Chloride: 106 mEq/L (ref 96–112)
Creatinine, Ser: 0.75 mg/dL (ref 0.4–1.2)
GFR calc Af Amer: >60 mL/min (ref >60)

## 2010-06-25 LAB — ACTH STIMULATION, 3 TIME POINTS: Cortisol, Base: 4.7 ug/dL

## 2010-08-28 NOTE — Discharge Summary (Signed)
NAMEROYALTY, FAKHOURI                ACCOUNT NO.:  [id number]   MEDICAL RECORD NO.:  [id number]          PATIENT TYPE:  INP   LOCATION:  6703                         FACILITY:  [facility]   PHYSICIAN:  Michaelyn Barter, M.D. DATE OF BIRTH:  1960-08-28   DATE OF ADMISSION:  05/03/2005  DATE OF DISCHARGE:  05/06/2005                                 DISCHARGE SUMMARY   The patient's primary care doctor is Dr. Maisie Fus in [place].  The patient's  orthopedic surgeon is Dr. Otelia Sergeant.   FINAL DIAGNOSIS:  Deep vein thrombosis of the right leg.   PROCEDURE:  Venous evaluation of the lower extremity.   SECONDARY DIAGNOSES:  1.  Hypothyroidism.  2.  Asthma.  3.  Gastroesophageal reflux disease   HISTORY OF PRESENT ILLNESS:  Ms. Wertheim is a 50 year old female who fell  approximately 10 days prior to this admission, at which time she injured her  right knee and was placed in a knee immobilizer.  Approximately four days  prior to admission she developed a swelling and pain within her right leg.  She went to see Dr. Vira Browns on the day of admission and was referred to  the hospital for lower extremity Dopplers, which demonstrated an acute right  lower extremity DVT.   PAST MEDICAL HISTORY:  Please see that dictated by Prudencio Pair. Arletha Grippe, M.D.,  on May 03, 2005.   HOSPITAL COURSE:  Problem 1.  RIGHT LOWER EXTREMITY DEEP VEIN THROMBOSIS:  On May 03, 2005, the patient underwent a venous study of her right lower extremity.  The final impression was that there was a DVT throughout the posterior  tibial vein leading into the popliteal vein and up to the proximal thigh,  involving the femoral vein also.  The common femoral and greater saphenous  veins were found to be patent.  Following these studies the patient was  started on IV heparin.  Shortly afterwards, Coumadin was initiated.  The  patient initially received 7.5 mg of Coumadin, followed by 10 mg, then 10  mg, then 7.5 mg of Coumadin, and  as a result, her INR was noted to be 1.1,  1.1, and as of today, 1.7.  Over the course of her hospitalization she  initially complained of a significant amount of pain within the right lower  extremity; however, each day her complaints decreased.  She was provided  pain medication on a p.r.n. basis.  As of today, the patient states that she  feels much better.   Problem 2.  HISTORY OF HYPOTHYROIDISM, HISTORY OF ASTHMA, HISTORY OF  GASTROESOPHAGEAL REFLUX DISEASE:  The patient had not complained of any  symptoms related to either of these diagnoses.  Her previously-prescribed  home medications were reinstituted following her admission into the  hospital.   Condition at the time of discharge is stable.  The patient currently states  that she still has occasional left leg pain.  She denies having any  shortness of breath or chest pain, no additional complaints.  The decision  has been made to discharge the patient home today.  The patient will  be  discharged home on the following medications:   1.  Pulmicort DPI two puffs b.i.d.  2.  Keflex 500 mg one tablet p.o. q.6h.  3.  Levoxyl 50 mcg one tablet daily.  4.  Vicodin 5/500 one to two tablets p.o. q.6h.  5.  Warfarin 5 mg 1-1/2 tablets p.o. daily.  6.  Lovenox 160 mg subcu q.12h.   The patient will be instructed to follow up with Dr. Otelia Sergeant following her  discharge from the hospital and also to see her primary care physician  within the next one to two weeks.  In addition, an order for home health  R.N. to visit the patient and to follow her PT and INR has been placed, and  this should take place on May 07, 2005, and the results of the patient's  INR should be faxed to Dr. Otelia Sergeant.      Michaelyn Barter, M.D.  Electronically Signed     OR/MEDQ  D:  05/06/2005  T:  05/07/2005  Job:  [id number]   cc:   Dr. Jani Files, [place]   Kerrin Champagne, M.D.  Fax: [phone]

## 2010-08-28 NOTE — Op Note (Signed)
NAME:  Beth Rodriguez, Beth Rodriguez                ACCOUNT NO.:  [id number]   MEDICAL RECORD NO.:  [id number]          PATIENT TYPE:  OIB   LOCATION:  5025                         FACILITY:  [facility]   PHYSICIAN:  Kerrin Champagne, M.D.   DATE OF BIRTH:  09-Sep-1960   DATE OF PROCEDURE:  04/23/2005  DATE OF DISCHARGE:  04/24/2005                                 OPERATIVE REPORT   PREOPERATIVE DIAGNOSIS:  Right knee deep anterior laceration involving the  prepatellar bursa and external retinaculum of the knee.   POSTOPERATIVE DIAGNOSIS:  Right knee deep anterior laceration involving the  prepatellar bursa and external retinaculum of the knee.   PROCEDURE:  Irrigation debridement with excision of areas of detritus over  the anterior aspect of the knee, transverse laceration wound with primary  closure over drain.   SURGEON:  Kerrin Champagne, M.D.   ASSISTANT:  Wende Neighbors, P.A.-C.   ANESTHESIA:  GOT by Bedelia Person, M.D.   ESTIMATED BLOOD LOSS:  25 mL.   DRAINS:  A JP flat, 10-French to closed suction.   SPECIMENS:  None.   CONDITION:  The patient returned to the postop recovery unit in satisfactory  condition.   HISTORY OF PRESENT ILLNESS:  This patient is a 50 year old female who works  for [employer].  At this 4:30 a.m., this morning, she  apparently slipped falling on some metal stairs sustaining a laceration of  the right anterior knee. No history of previous knee injury. She apparently  is recovering from a previous back injury. She also had complaints of right  wrist pain discomfort. X-rays of her knee and wrist are negative for bony  trauma. She does have a laceration that was transverse over the right knee  measuring approximately 8-10 cm in length, through the skin and subcu layers  down to the external retinaculum of the knee; where it shows numerous areas  where the external retinaculum may have been partially torn. The emergency  room physician consulted  orthopedics because of concerns of an intra-  articular laceration. The patient underwent initial debridement,  intraoperatively, and closure over drains. Intraoperative inflation of the  knee with irrigant solution did not show any drainage from the knee or sign  of open wound into the right knee.   DESCRIPTION OF PROCEDURE:  After adequate general anesthesia, the right  lower extremity prepped with Betadine scrub and prep solution from the right  ankle to the upper thigh and draped in the usual manner.  The laceration  extending from medial-lateral, transverse with a smaller laceration that  connected with the wound, only leaving about 0.5 cm bridge over the medial  aspect of the proximal flap. This laceration additionally was about 1.5 cm  in length. Initial debridement was carried out over the edges of the wound  excising them over 1 mm with a 10-blade scalpel; then excising prepatellar  fat that and appeared to be without very good blood supply, over the  inferior aspect of the superior flap. The anterior aspect of the knee then  evaluated, as well as the retinaculum. There  did not appear to be any gross  entry point into the knee joint with the wound opened well enough to view  the anterior aspect of the knee. An 18-gauge needle using a 60 mL syringe  then used to inflate the knee with normal saline; and with this there was no  sign of extravasation of fluid into the wound. Irrigate was then removed, as  best as possible, some left in place to allow for it to be resorbed later.  Note, that intraoperative aspiration did not show any blood within the knee  joint which would tend to support that there was no sign of interarticular  trauma related to this laceration superficially; and in the deep area f the  knee anteriorly. With this then, further debridement was carried out of the  flaps superiorly and inferiorly. The area of skin bridge over the medial-  superior flap edge was  resected and the incision carried somewhat more  medially, and curved distalward in order for this to be pulled into the area  of removed skin.   Following irrigation then with copious amounts of saline solution, the open  anterior knee wound was then irrigated with double antibiotic solution. A  separate stab incision made over the superolateral aspect of the thigh just  proximal to the incision; and then a hemostat used to place the Penrose  drain within the knee joint. This was cut to the appropriate length. The  superficial retinaculum of the knee was then approximated, with the deeper  subcu layer superior flap using interrupted #1 Vicryl sutures.  The deep  subcu layer was approximated with interrupted #0 Vicryl sutures; and the  more superficial layers with interrupted 2-0 Vicryl sutures; and skin closed  with 14 sutures of simple flap stitches, as well as, vertical mattress  sutures, laterally, using 3-0 nylon. Adaptic, 4x4s, and ABD pads fixed to  the skin with Kerlix; and then Ace wrap applied from the right foot to the  upper thigh. Knee immobilizer then applied. The patient then had her JP  charged. She was then reactivated, extubated, and returned to the recovery  room in satisfactory condition. All instrument and sponge counts were  correct.      Kerrin Champagne, M.D.  Electronically Signed     JEN/MEDQ  D:  04/23/2005  T:  04/24/2005  Job:  [id number]

## 2010-08-28 NOTE — H&P (Signed)
NAME:  Beth Rodriguez, Beth Rodriguez NO.:  [id number]   MEDICAL RECORD NO.:  [id number]          PATIENT TYPE:  OUT   LOCATION:  VASC                         FACILITY:  [facility]   PHYSICIAN:  Nelma Rothman, MD   DATE OF BIRTH:  08-17-1960   DATE OF ADMISSION:  05/03/2005  DATE OF DISCHARGE:                                HISTORY & PHYSICAL   PRIMARY CARE PHYSICIAN:  Dr. Maisie Fus in [place], [place].  The  patient is unassigned here at [facility].   CHIEF COMPLAINT:  Right lower extremity swelling and pain.   HISTORY OF PRESENT ILLNESS:  The patient is a 50 year old female who is  status post a fall at work about 10 days ago, during which she injured her  right knee, necessitating a knee immobilizer.  About 4 days ago, she noted  increase in swelling and pain of that leg.  She saw Kerrin Champagne, M.D.  today in the office and he referred her for lower extremity Dopplers which  showed an acute right lower extremity DVT.  She has no prior history of  hypercoagulability or cancer.   PAST MEDICAL HISTORY:  1.  Hypothyroidism.  2.  Gastroesophageal reflux disease.  3.  Asthma.   SOCIAL HISTORY:  She lives in [place], [place], with her husband.  She has one daughter who is married.  She denies any history of tobacco or  alcohol.   FAMILY HISTORY:  Her mother and father both died of myocardial infarctions.  Her mother also had CHF.  She has a sister with diabetes.   REVIEW OF SYSTEMS:  She notes some chest wall pain which is worse when she  pushes on it as well as some right wrist pain, both of which have been  present since the fall and she attributes to the fall.   ALLERGIES:  No known drug allergies.   MEDICATIONS:  1.  Foradil one puff b.i.d.  2.  Pulmicort four puffs b.i.d.  3.  Levoxyl 50 mcg daily.  4.  Protonix 40 mg daily.  5.  Oral contraceptive pills.  6.  Keflex 500 mg four times daily for an additional 5 days.  7.  Methocarbamol  p.r.n.  8.  Vicodin p.r.n.   PHYSICAL EXAMINATION:  VITAL SIGNS:  Temperature 97.8, pulse 90, blood  pressure 139/87, respiratory rate 20, saturating 96% on room air.  GENERAL:  She is in no apparent distress.  HEENT:  Mucous membranes are moist.  There is no exudate.  NECK:  Supple, there is no JVD.  HEART:  Regular rate and rhythm with no murmurs, rubs, or gallops.  LUNGS:  Clear to auscultation bilaterally without wheezes.  ABDOMEN:  Soft, nontender, and nondistended with normal active bowel sounds.  EXTREMITIES:  Her right lower extremity has an increase in swelling as  compared to the left as well as some mild erythema and tenderness to  palpation.  Her right knee is wrapped and the dressing was not taken down by  me.  NEUROLOGY:  Grossly nonfocal.   LABORATORY DATA:  Pending.  Lower  extremity Dopplers demonstrate DVT  throughout the right lower extremity, starting in the posterior tibial vein,  extending into the popliteal and finally femoral vein.   ASSESSMENT:  1.  Acute deep venous thrombosis.  Her heparin drip has already been      started.  We will start Coumadin tonight.  The etiology is almost      certainly secondary to her fall and right lower extremity injury.  She      may want to readdress her use of oral contraceptive pills with her      primary care physician, but I doubt this is the primary issue.  No      hypercoagulability workup was indicated.  We will discuss with the case      manager in the morning as she may be able to go home on Lovenox and      Coumadin.  2.  Hypothyroidism, continue Synthroid.  3.  Gastroesophageal reflux disease, continue proton pump inhibitor.  4.  Asthma. Continue home medications.      Nelma Rothman, MD  Electronically Signed     RAR/MEDQ  D:  05/03/2005  T:  05/04/2005  Job:  [id number]

## 2010-09-11 ENCOUNTER — Encounter: Payer: Self-pay | Admitting: Family

## 2011-03-20 IMAGING — CT CT ANGIO CHEST
2 of 7 series · 18 of 36 positions shown · IV contrast (APPLIED)
Comparison: 01/23/2010.

CLINICAL DATA: Worsening short of breath.  Dyspnea on exertion.
Recent pneumonia.  History of deep venous thrombosis.  Right middle
lobe atelectasis.  Pulmonary mass.  Pulmonary embolus.

CT ANGIOGRAPHY CHEST WITH CONTRAST
TECHNIQUE: Multidetector CT imaging of the chest was performed
using the standard protocol during bolus administration of
intravenous contrast.  Multiplanar CT image reconstructions
including MIPs were obtained to evaluate the vascular anatomy.
Contrast:  100 ml [id number].

[Series 5: pe 1.0 b25f · axial · 0.72mm/px · z∈[-318,-64]mm · 17 of 284 slices shown]
[im 15/284  lung]
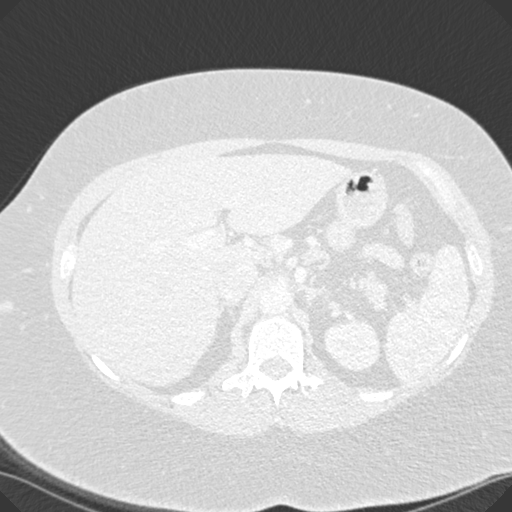
[im 29/284  mediastinal]
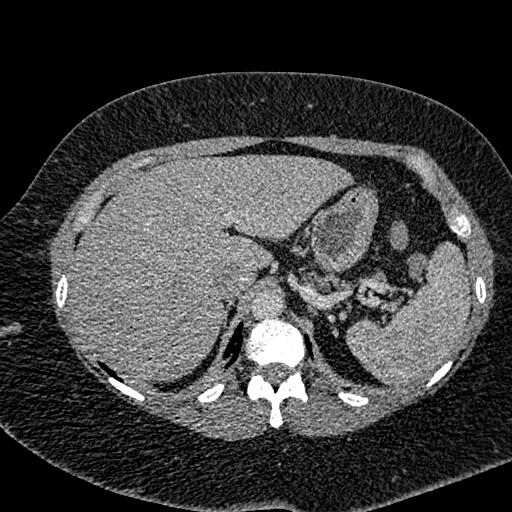
[im 43/284  lung]
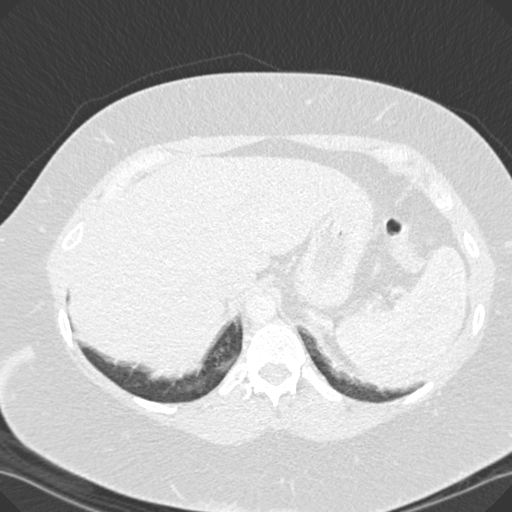
[im 57/284  mediastinal]
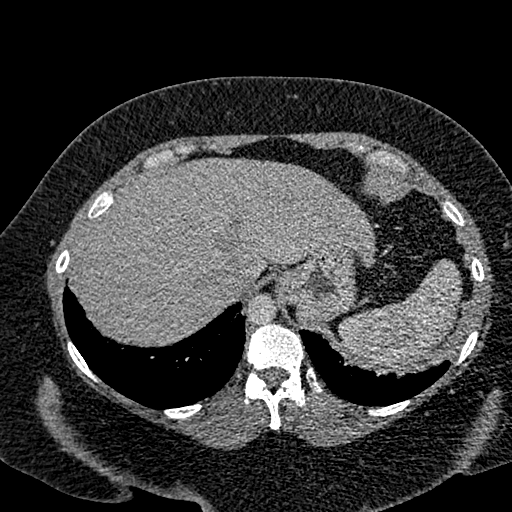
[im 85/284  lung]
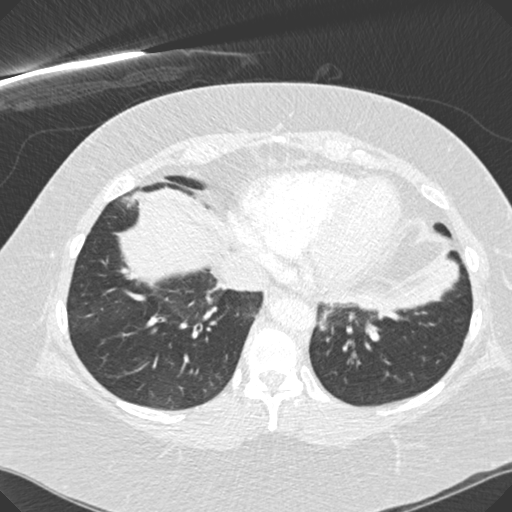
[im 100/284  mediastinal]
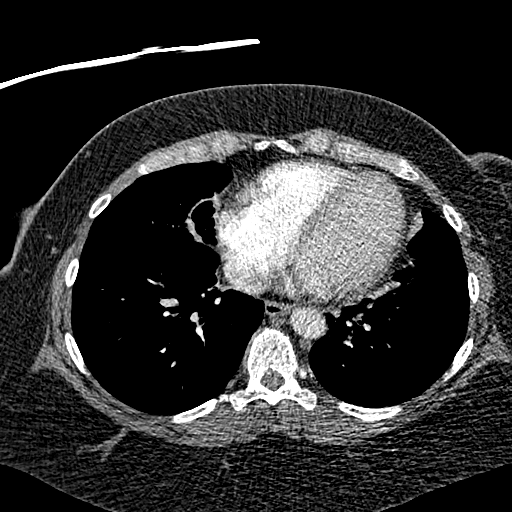
[im 114/284  lung]
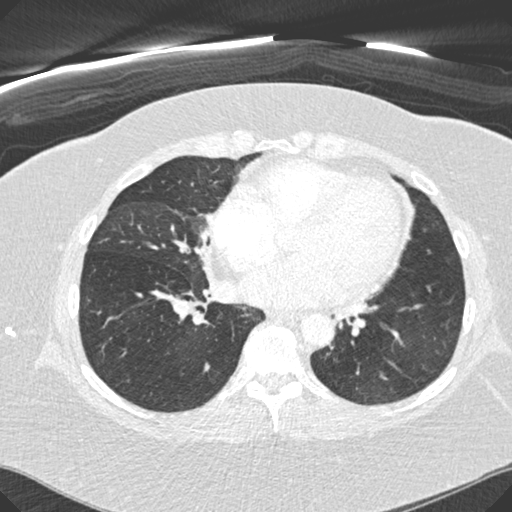
[im 128/284  mediastinal]
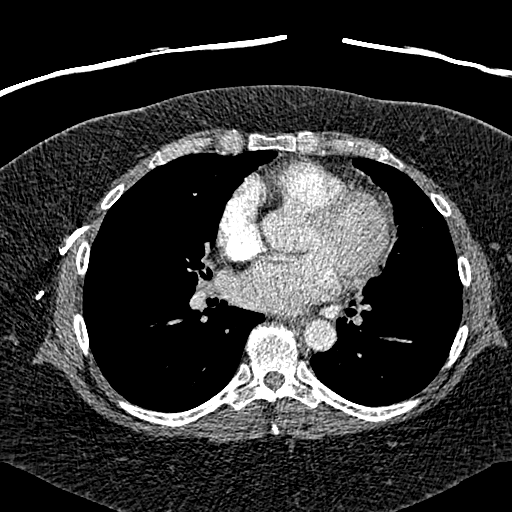
[im 142/284  lung]
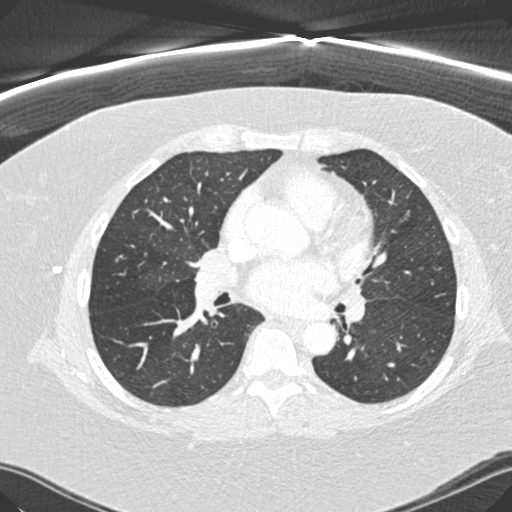
[im 156/284  mediastinal]
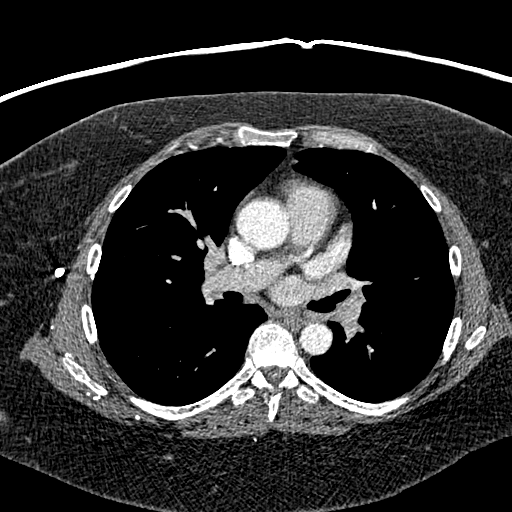
[im 170/284  lung]
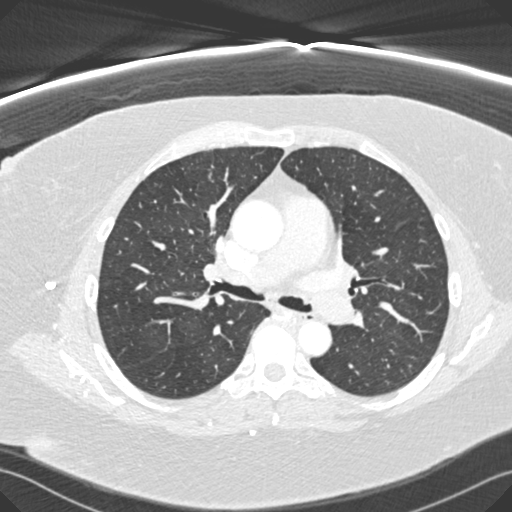
[im 184/284  mediastinal]
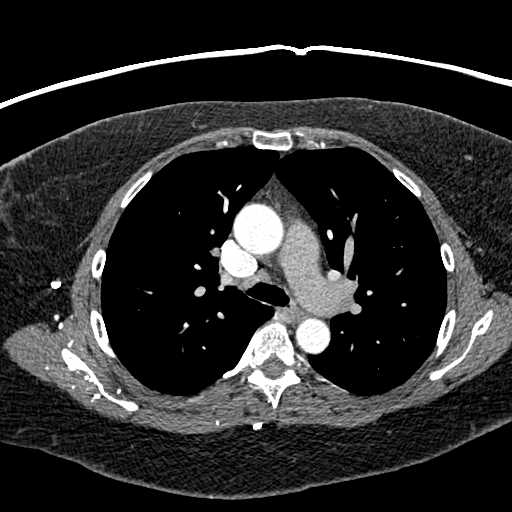
[im 199/284  lung]
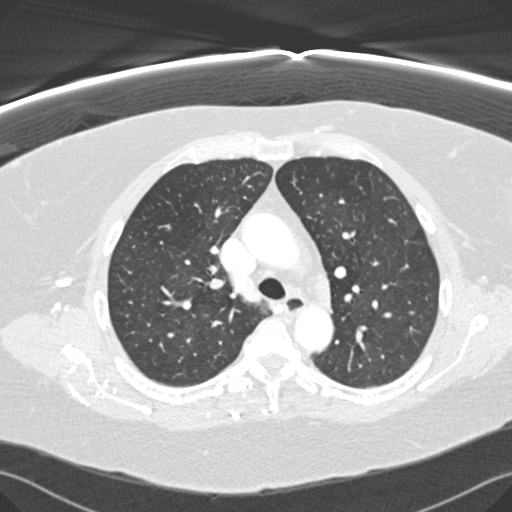
[im 227/284  mediastinal]
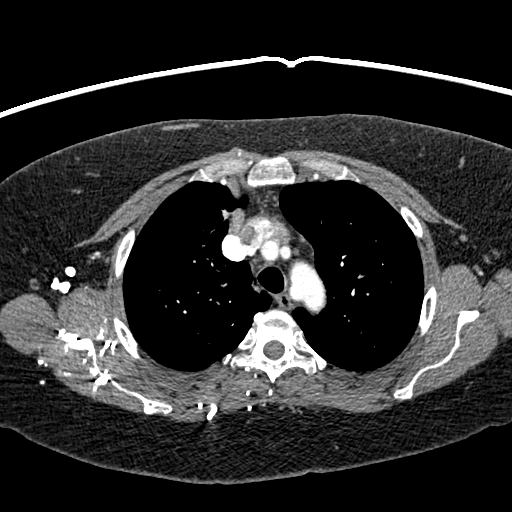
[im 241/284  lung]
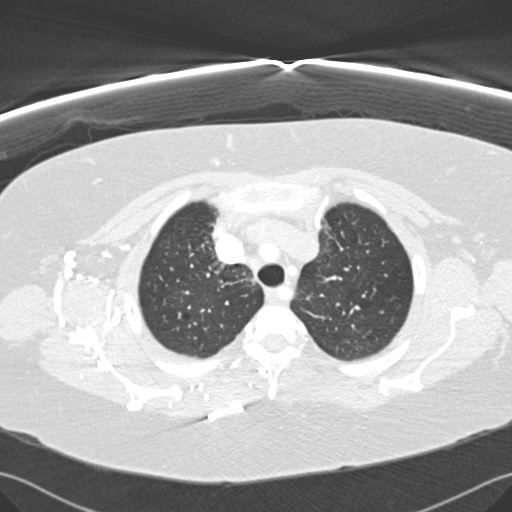
[im 255/284  mediastinal]
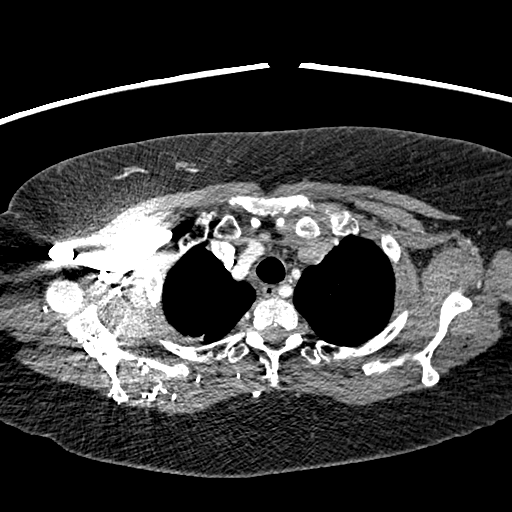
[im 269/284  lung]
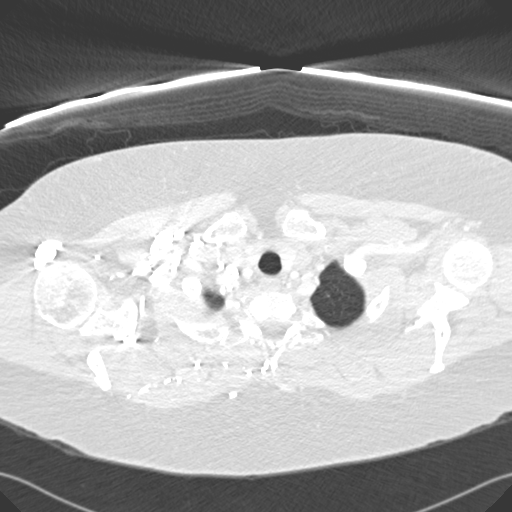

[Series 8: pe 2.0 coronal · coronal · 0.59mm/px · 1 of 110 slices shown]
[im 55/110  mediastinal]
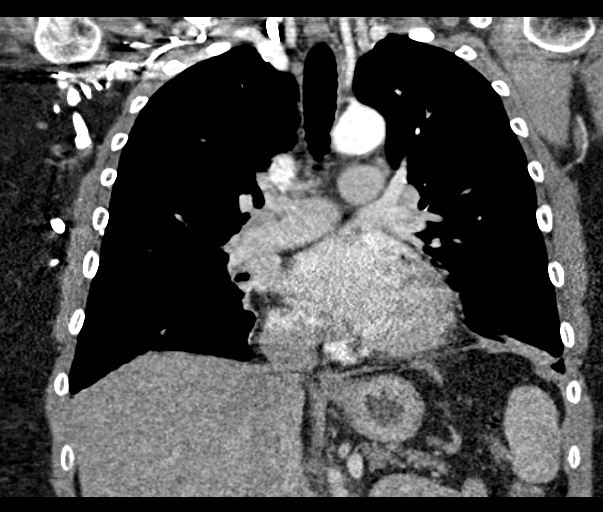

[18 of 36 positions shown; findings below may reference images not displayed]

FINDINGS: The initial study had contrast bolus dispersion and poor
opacification of the pulmonary arteries.  The patient was
subsequently returned and a second bolus of contrast was
administered.  This shows good opacification of the pulmonary
arteries.  There is no pulmonary embolus present.  Four vessel
aortic arch.  No acute aortic abnormality.  No mediastinal, hilar,
or axillary lymphadenopathy.  No pericardial or pleural effusion.
Bones appear within normal limits.  Incidental imaging of the upper
abdomen is unremarkable.    This is linear and there is no definite
mass.  No obstructing endobronchial  lesion.  Mosaic attenuation is
present in the lungs bilaterally.

In the right upper lobe on axial image number 32 there is a small
area of ground-glass attenuation.  Linear somewhat nodular
densities are present in the right upper lobe on axial image number
22 measuring 11 x 6 mm and 12 x 4 mm.  Similar  area is present in
the right apex on axial image number 14.  Atelectasis is present
over both hemidiaphragms.  Old granulomatous disease of the spleen.

Review of the MIP images confirms the above findings.
IMPRESSION: 1.  Technically adequate study without pulmonary embolus.
2.  Mild mosaic attenuation of the lungs, suggesting small
airways/small vessel disease.
3.  Linear right upper lobe densities may represent focal areas of
atelectasis however pulmonary nodule cannot be excluded.  3-month
follow-up chest CT is recommended to assess for resolution. In a
patient with recent pneumonia, these may represent areas of post
infectious/inflammatory change or scarring. The medial segment
right middle lobe atelectasis can be reevaluated at that time.

## 2011-06-03 ENCOUNTER — Other Ambulatory Visit: Payer: Self-pay | Admitting: Critical Care Medicine

## 2011-06-03 NOTE — Telephone Encounter (Signed)
Pt was last seen by PW 04/24/2010 and has no pending appt.    I called pt's home and cell #'s - [email or web address] to schedule OV.  Once this is scheduled, I will send rx.

## 2011-06-08 NOTE — Telephone Encounter (Signed)
Called, spoke with pt.  Pt states the alvesco rx was sent to [place] in error. States this was supposed to be sent to Dr. Sharyn Lull.  I offered to schedule a follow up with Dr. Delford Field but pt declined at this time stating she is following up with Dr. Sharyn Lull because he is closer to her.  I asked her to pls call back if anything further is needed in the future.  She verbalized understanding of this and voiced no further questions/cocnerns at this time.

## 2014-01-28 ENCOUNTER — Ambulatory Visit (INDEPENDENT_AMBULATORY_CARE_PROVIDER_SITE_OTHER): Payer: BC Managed Care – PPO

## 2014-01-28 VITALS — BP 124/72 | HR 68 | Resp 12

## 2014-01-28 DIAGNOSIS — Q828 Other specified congenital malformations of skin: Secondary | ICD-10-CM

## 2014-01-28 DIAGNOSIS — B070 Plantar wart: Secondary | ICD-10-CM

## 2014-01-28 NOTE — Patient Instructions (Signed)

## 2014-01-28 NOTE — Progress Notes (Signed)
Subjective:    Patient ID: Beth Rodriguez, female    DOB: 03-18-1961, 53 y.o.   MRN: [id number]  HPI  ''B/L FOOT HAVE CALLUS AND THEY HURT WHEN WALKING.''   PT STATED RT MEDIAL SIDE AND LT LATERAL SIDE FOOT  HAVE CALLUS AND BEEN HURTING FOR 1 + YEAR. THE CALLUS ARE GETTING WORSE AND THICKER. FEET GET AGGRAVATED BY PRESSURE. TRIED TO SOAK WITH WARM WATER BUT NO HELP.    Review of Systems  Musculoskeletal: Positive for gait problem.  All other systems reviewed and are negative.      Objective:   Physical Exam 53 year old white female well-developed well-nourished oriented x3 presents at this time proxy 1 year history of painful which is present callus covers are well located circumscribed lesion sub-fifth metatarsal base area plantar lateral midfoot area of left foot. Patient has had problems on the contralateral right foot which were treated on multiple occasions and have resolved toe on the left is painful on direct lateral compression ambulation. Patient does have appropriate foot changes with pes planus and has valgus foot type with abduction of the forefoot prominence of talonavicular joint with subluxation of the mid tarsus noted. Clinically. No radiographs are taken at this time objective findings follows vascular status is intact pedal pulses palpable DP +2/4 PT plus one over 4 bilateral mild + edema noted number pallor noted mild varicosities noted neurologically epicritic and proprioceptive sensations appear to be intact and symmetric bilateral normal plantar response DTRs not elicited dermatologically skin color pigment normal hair growth absent nails criptotic friable and discolored there is some dry skin and examine the leakage circumscribed lesion sub-fifth metatarsal base lateral midfoot of the left right foot is unremarkable again orthopedic exam reveals mild promontory changes and has valgus deformity and talonavicular prominence medially and plantar bilateral left more so than  right. 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 Will reevaluate in one month if needed  Harriet Masson DPM

## 2014-08-05 ENCOUNTER — Encounter (HOSPITAL_COMMUNITY)
Admission: RE | Admit: 2014-08-05 | Discharge: 2014-08-05 | Disposition: A | Discharge status: Home / Self Care | Payer: BLUE CROSS/BLUE SHIELD | Source: Ambulatory Visit | Attending: Orthopedic Surgery | Admitting: Orthopedic Surgery

## 2014-08-05 ENCOUNTER — Ambulatory Visit: Payer: Self-pay | Admitting: Physician Assistant

## 2014-08-05 ENCOUNTER — Encounter (HOSPITAL_COMMUNITY): Payer: Self-pay

## 2014-08-05 DIAGNOSIS — Z01812 Encounter for preprocedural laboratory examination: Secondary | ICD-10-CM | POA: Diagnosis present

## 2014-08-05 DIAGNOSIS — I10 Essential (primary) hypertension: Secondary | ICD-10-CM | POA: Diagnosis not present

## 2014-08-05 HISTORY — DX: Reserved for inherently not codable concepts without codable children: IMO0001

## 2014-08-05 HISTORY — DX: Unspecified osteoarthritis, unspecified site: M19.90

## 2014-08-05 HISTORY — DX: Hypothyroidism, unspecified: E03.9

## 2014-08-05 HISTORY — DX: Gastro-esophageal reflux disease without esophagitis: K21.9

## 2014-08-05 LAB — BASIC METABOLIC PANEL
ANION GAP: 8 (ref 5–15)
BUN: 8 mg/dL (ref 6–23)
CO2: 28 mmol/L (ref 19–32)
Calcium: 9.3 mg/dL (ref 8.4–10.5)
Chloride: 107 mmol/L (ref 96–112)
Creatinine, Ser: 0.82 mg/dL (ref 0.50–1.10)
GFR calc Af Amer: >90 mL/min (ref >90)
GFR, EST NON AFRICAN AMERICAN: 80 mL/min — AB (ref >90)
GLUCOSE: 87 mg/dL (ref 70–99)
Potassium: 4 mmol/L (ref 3.5–5.1)
Sodium: 143 mmol/L (ref 135–145)

## 2014-08-05 LAB — CBC
HCT: 40.5 % (ref 36.0–46.0)
Hemoglobin: 12.9 g/dL (ref 12.0–15.0)
MCH: 29.1 pg (ref 26.0–34.0)
MCHC: 31.9 g/dL (ref 30.0–36.0)
MCV: 91.4 fL (ref 78.0–100.0)
Platelets: 273 10*3/uL (ref 150–400)
RBC: 4.43 MIL/uL (ref 3.87–5.11)
RDW: 13.5 % (ref 11.5–15.5)
WBC: 3.7 10*3/uL — ABNORMAL LOW (ref 4.0–10.5)

## 2014-08-05 NOTE — Progress Notes (Signed)
Requested EKG,OV and any other cardiac testing from Dr. Gilford Rile' office.

## 2014-08-05 NOTE — Progress Notes (Signed)
Dr.Caffrey's office notified that we need pre-op orders

## 2014-08-05 NOTE — Pre-Procedure Instructions (Signed)
KIARI HOSMER  08/05/2014   Your procedure is scheduled on:  08-09-2014    Friday   Report to [facility] Admitting at 8:00 AM.   Call this number if you have problems the morning of surgery: [phone]   Remember:   Do not eat food or drink liquids after midnight.    Take these medicines the morning of surgery with A SIP OF WATER: Inhalers and nebulizer as ordered by MD,levothyroxine(synthroid),Claritin if needed,methocarbonal if needed,pain medication if needed,protonix   Do not wear jewelry, make-up or nail polish.  Do not wear lotions, powders, or perfumes.   Do not shave 48 hours prior to surgery.  Do not bring valuables to the hospital.  [facility] is not responsible for any belongings or valuables.               Contacts, dentures or bridgework may not be worn into surgery.                   Patients discharged the day of surgery will not be allowed to drive home.     Special Instructions: See attached sheet for instructions on CHG shower/bath    Please read over the following fact sheets that you were given: Pain Booklet and Surgical Site Infection Prevention

## 2014-08-06 NOTE — Progress Notes (Addendum)
Anesthesia Chart Review:  Pt is 54 year old female scheduled for R shoulder arthroscopy with open rotator cuff repair and distal clavicle acrominectomy on 08/09/2014 with Dr. French Ana.   PCP is Dr. Gilford Rile. Asthma doctor is Dr. Carmelina Peal in [place].   PMH includes: HTN, enlarged heart, asthma, OSA, DVT, hypothyroidism, GERD. Never smoker. BMI 53.   Medications include: chronic prednisone. Pt to start eliquis post-operatively.   Preoperative labs reviewed.    EKG 06/24/2014: NSR with 1st degree AV block.   Cardionet monitor 2009 showed PACs and PVCs.   Pt has clearance for procedure from Dr. Bea Graff with the following comments: "Her problems that she could experience would be flare up of asthma. The patient should have a nebulizer available for the anesthesiologist to provide her should she have bronchospasm that occurs. With acute treatment and stress, she could experience PVC's and PAC's. These will be able to be monitored for her throughout the procedure and in general, should be okay. An EKG was taken today in the office and looks okay. The patient should be able to do her procedure and be discharged home. However, if she does have complications with her asthma or rhythm issues and ultimately is kept overnight, then this patient should be placed on DVT prophylaxis with her history of DVT in the past."  Pt also has clearance from Dr. Carmelina Peal who notes "pt uses daily prednisone at 5mg  once per day. Pt will increase dose to 10mg  on day of procedure and an additional 2 days after surgery".  If no changes, I anticipate pt can proceed with surgery as scheduled.   Willeen Cass, FNP-BC [facility]/Anesthesiology Phone: [phone] 08/06/2014 2:13 PM

## 2014-08-08 ENCOUNTER — Ambulatory Visit: Payer: Self-pay | Admitting: Physician Assistant

## 2014-08-08 MED ORDER — SODIUM CHLORIDE 0.9 % IV SOLN
1500.0000 mg | INTRAVENOUS | Status: DC
  Filled 2014-08-08: qty 1500

## 2014-08-08 NOTE — H&P (Signed)
Beth Rodriguez is an 54 y.o. female.   Chief Complaint: right shoulder pain HPI: Patient recently of Dr. Lynann Bologna who is nonoperative at this point.  History of trauma, pulling a chair and heard a pop in her shoulder.  Sees Dr. Gilford Rile in the [place] area.  Comes in worked up with a severe rotator cuff tear with a large amount of retraction with two-tendon tear.  Does have some AC changes and type 2 acromion.  She has significant pain, pain at night.    Her past medical history is remarkable for asthma as well as sleep apnea although she states she was unable to use the device she was given for the sleep apnea.  She has had cataract surgery.  Has a history of high blood pressure as well.  Medications include losartan, levothyroxine, pantoprazole, and prednisone 5 mg.  She has been cut back on her prednisone.  She is on a relatively new asthma drug which she has mentioned to me does not preclude her from having surgery although obviously prior to any surgery we will need to get clearance from her medical doctor as well as her allergist.  She did have a blood clot with previous knee surgery within the last 10 years.  Family history positive for heart disease in mother and father.  Past Medical History  Diagnosis Date  . Asthma   . Hypertension   . Embolism - blood clot 2007    right leg hx of  . Thyroid disease     hypothyroidism  . Enlarged heart   . Carpal tunnel syndrome on right   . Sleep apnea     last sleep study over five years  . Shortness of breath dyspnea     with exertion  . Hypothyroidism   . GERD (gastroesophageal reflux disease)   . Arthritis     Past Surgical History  Procedure Laterality Date  . Knee surgery  2007    right knee  . Sinus surgery with instatrak      ???  . Carpal tunnel release Right   . Eye surgery Bilateral     cataract removal     Family History  Problem Relation Age of Onset  . Hypertension Mother   . Hypertension Sister   . Diabetes Sister    . Hyperlipidemia Sister   . Cancer Sister     skin cancer  . Carpal tunnel syndrome Sister   . Hypertension Daughter   . Carpal tunnel syndrome Daughter   . Hypertension Brother    Social History:  reports that she has never smoked. She does not have any smokeless tobacco history on file. She reports that she does not drink alcohol or use illicit drugs.  Allergies:  Allergies  Allergen Reactions  . Penicillins Hives  . Theophylline     REACTION: headaches     (Not in a hospital admission)  No results found for this or any previous visit (from the past 48 hour(s)). No results found.  Review of Systems  Constitutional: Negative.   HENT: Negative.   Eyes: Negative.   Respiratory: Negative.   Cardiovascular: Negative.   Gastrointestinal: Positive for heartburn. Negative for nausea, vomiting, abdominal pain, diarrhea, constipation, blood in stool and melena.  Genitourinary: Negative.   Musculoskeletal: Positive for myalgias and joint pain.  Skin: Negative.   Neurological: Negative.   Endo/Heme/Allergies: Negative.   Psychiatric/Behavioral: Negative.     There were no vitals taken for this visit. Physical Exam  Constitutional: She is oriented to person, place, and time. She appears well-developed and well-nourished. No distress.  HENT:  Head: Normocephalic and atraumatic.  Nose: Nose normal.  Eyes: Conjunctivae and EOM are normal. Pupils are equal, round, and reactive to light.  Neck: Normal range of motion. Neck supple.  Cardiovascular: Normal rate and intact distal pulses.   Respiratory: Effort normal. No respiratory distress.  GI: Soft. She exhibits no distension. There is no tenderness.  Musculoskeletal:       Right shoulder: She exhibits decreased range of motion, tenderness, pain and decreased strength.  Right shoulder significant painful and weak abduction.  She has positive impingement sign, positive empty can test.  Some tenderness over the [facility] joint.   Neurological: She is alert and oriented to person, place, and time.  Skin: Skin is warm and dry. No rash noted. No erythema.  Psychiatric: She has a normal mood and affect. Her behavior is normal.     Assessment/Plan Right shoulder rotator cuff tear  Some concern about the size of the tear relative to repairing it, but hopefully given her age and the fact that it appears to be an acute tear, we should be able to get this fixed unless it is more of a chronic issue and that the recent event only drew her attention to it, so to speak.  PLAN: 1. I think, although it is probably not extremely risky based on her history for an upper extremity clot or a lower extremity clot, with that medical history that she has, as well as the history of a clot, we will probably consider short-term anticoagulation prophylaxis with either Eliquis or Xarelto. 2. Medical clearance from her medical doctor, Dr. Bea Graff, as well as her allergist.  3. We would need to look at a general anesthetic with a possible nerve block.  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  She has been told they may accept her at light duty.  I would anticipate a minimum of 4-6 works though out of work even if she was going to do light duty.   Chriss Czar 08/08/2014, 1:23 PM

## 2014-08-09 ENCOUNTER — Ambulatory Visit (HOSPITAL_COMMUNITY)
Admission: RE | Admit: 2014-08-09 | Discharge: 2014-08-10 | Disposition: A | Discharge status: Home / Self Care | Payer: BLUE CROSS/BLUE SHIELD | Source: Ambulatory Visit | Attending: Orthopedic Surgery | Admitting: Orthopedic Surgery

## 2014-08-09 ENCOUNTER — Encounter (HOSPITAL_COMMUNITY): Payer: Self-pay | Admitting: Certified Registered Nurse Anesthetist

## 2014-08-09 ENCOUNTER — Ambulatory Visit (HOSPITAL_COMMUNITY): Payer: BLUE CROSS/BLUE SHIELD | Admitting: Certified Registered Nurse Anesthetist

## 2014-08-09 ENCOUNTER — Encounter (HOSPITAL_COMMUNITY)
Admission: RE | Disposition: A | Discharge status: Home / Self Care | Payer: Self-pay | Source: Ambulatory Visit | Attending: Orthopedic Surgery

## 2014-08-09 ENCOUNTER — Ambulatory Visit (HOSPITAL_COMMUNITY): Payer: BLUE CROSS/BLUE SHIELD | Admitting: Emergency Medicine

## 2014-08-09 DIAGNOSIS — I10 Essential (primary) hypertension: Secondary | ICD-10-CM | POA: Insufficient documentation

## 2014-08-09 DIAGNOSIS — M75101 Unspecified rotator cuff tear or rupture of right shoulder, not specified as traumatic: Secondary | ICD-10-CM

## 2014-08-09 DIAGNOSIS — M1990 Unspecified osteoarthritis, unspecified site: Secondary | ICD-10-CM | POA: Insufficient documentation

## 2014-08-09 DIAGNOSIS — K219 Gastro-esophageal reflux disease without esophagitis: Secondary | ICD-10-CM | POA: Insufficient documentation

## 2014-08-09 DIAGNOSIS — Y929 Unspecified place or not applicable: Secondary | ICD-10-CM | POA: Insufficient documentation

## 2014-08-09 DIAGNOSIS — Z79899 Other long term (current) drug therapy: Secondary | ICD-10-CM | POA: Insufficient documentation

## 2014-08-09 DIAGNOSIS — Z6841 Body Mass Index (BMI) 40.0 and over, adult: Secondary | ICD-10-CM | POA: Insufficient documentation

## 2014-08-09 DIAGNOSIS — Z888 Allergy status to other drugs, medicaments and biological substances status: Secondary | ICD-10-CM | POA: Diagnosis not present

## 2014-08-09 DIAGNOSIS — Z880 Allergy status to penicillin: Secondary | ICD-10-CM | POA: Insufficient documentation

## 2014-08-09 DIAGNOSIS — G4730 Sleep apnea, unspecified: Secondary | ICD-10-CM | POA: Diagnosis not present

## 2014-08-09 DIAGNOSIS — E039 Hypothyroidism, unspecified: Secondary | ICD-10-CM | POA: Diagnosis not present

## 2014-08-09 DIAGNOSIS — X58XXXA Exposure to other specified factors, initial encounter: Secondary | ICD-10-CM | POA: Insufficient documentation

## 2014-08-09 DIAGNOSIS — M75121 Complete rotator cuff tear or rupture of right shoulder, not specified as traumatic: Secondary | ICD-10-CM | POA: Insufficient documentation

## 2014-08-09 DIAGNOSIS — J45909 Unspecified asthma, uncomplicated: Secondary | ICD-10-CM | POA: Insufficient documentation

## 2014-08-09 HISTORY — PX: SHOULDER ARTHROSCOPY WITH OPEN ROTATOR CUFF REPAIR AND DISTAL CLAVICLE ACROMINECTOMY: SHX5683

## 2014-08-09 HISTORY — DX: Unspecified rotator cuff tear or rupture of right shoulder, not specified as traumatic: M75.101

## 2014-08-09 SURGERY — SHOULDER ARTHROSCOPY WITH OPEN ROTATOR CUFF REPAIR AND DISTAL CLAVICLE ACROMINECTOMY
Anesthesia: Regional | Site: Shoulder | Laterality: Right

## 2014-08-09 MED ORDER — ROCURONIUM BROMIDE 100 MG/10ML IV SOLN
INTRAVENOUS | Status: DC | PRN
  Administered 2014-08-09: 40 mg via INTRAVENOUS
  Administered 2014-08-09 (×2): 10 mg via INTRAVENOUS

## 2014-08-09 MED ORDER — PROPOFOL 10 MG/ML IV BOLUS
INTRAVENOUS | Status: AC
  Filled 2014-08-09: qty 20

## 2014-08-09 MED ORDER — NEOSTIGMINE METHYLSULFATE 10 MG/10ML IV SOLN
INTRAVENOUS | Status: AC
  Filled 2014-08-09: qty 1

## 2014-08-09 MED ORDER — APIXABAN 2.5 MG PO TABS
2.5000 mg | ORAL_TABLET | Freq: Two times a day (BID) | ORAL | Status: DC
  Administered 2014-08-09 – 2014-08-10 (×2): 2.5 mg via ORAL
  Filled 2014-08-09 (×2): qty 1

## 2014-08-09 MED ORDER — SODIUM CHLORIDE 0.9 % IV SOLN
INTRAVENOUS | Status: DC
  Administered 2014-08-09: 17:00:00 via INTRAVENOUS

## 2014-08-09 MED ORDER — METOCLOPRAMIDE HCL 5 MG/ML IJ SOLN: 5.0000 mg | Freq: Three times a day (TID) | INTRAMUSCULAR | Status: DC | PRN

## 2014-08-09 MED ORDER — ROCURONIUM BROMIDE 50 MG/5ML IV SOLN
INTRAVENOUS | Status: AC
  Filled 2014-08-09: qty 1

## 2014-08-09 MED ORDER — PROMETHAZINE HCL 25 MG/ML IJ SOLN: 6.2500 mg | INTRAMUSCULAR | Status: DC | PRN

## 2014-08-09 MED ORDER — GLYCOPYRROLATE 0.2 MG/ML IJ SOLN
INTRAMUSCULAR | Status: DC | PRN
  Administered 2014-08-09: 0.6 mg via INTRAVENOUS

## 2014-08-09 MED ORDER — SODIUM CHLORIDE 0.9 % IR SOLN
Status: DC | PRN
  Administered 2014-08-09: 3000 mL

## 2014-08-09 MED ORDER — FENTANYL CITRATE (PF) 250 MCG/5ML IJ SOLN
INTRAMUSCULAR | Status: AC
  Filled 2014-08-09: qty 5

## 2014-08-09 MED ORDER — PREDNISONE 5 MG PO TABS
5.0000 mg | ORAL_TABLET | Freq: Every day | ORAL | Status: DC
  Administered 2014-08-10: 5 mg via ORAL
  Filled 2014-08-09: qty 1

## 2014-08-09 MED ORDER — ONDANSETRON HCL 4 MG/2ML IJ SOLN
INTRAMUSCULAR | Status: DC | PRN
  Administered 2014-08-09: 4 mg via INTRAVENOUS

## 2014-08-09 MED ORDER — BUPIVACAINE-EPINEPHRINE (PF) 0.5% -1:200000 IJ SOLN
INTRAMUSCULAR | Status: DC | PRN
  Administered 2014-08-09: 20 mL via PERINEURAL

## 2014-08-09 MED ORDER — LEVOTHYROXINE SODIUM 75 MCG PO TABS
75.0000 ug | ORAL_TABLET | Freq: Every day | ORAL | Status: DC
  Administered 2014-08-10: 75 ug via ORAL
  Filled 2014-08-09: qty 1

## 2014-08-09 MED ORDER — LOSARTAN POTASSIUM 50 MG PO TABS
100.0000 mg | ORAL_TABLET | Freq: Every day | ORAL | Status: DC
Start: 2014-08-10 — End: 2014-08-10
  Administered 2014-08-10: 100 mg via ORAL
  Filled 2014-08-09: qty 2

## 2014-08-09 MED ORDER — PHENYLEPHRINE HCL 10 MG/ML IJ SOLN
INTRAMUSCULAR | Status: DC | PRN
  Administered 2014-08-09 (×2): 80 ug via INTRAVENOUS
  Administered 2014-08-09: 40 ug via INTRAVENOUS
  Administered 2014-08-09: 80 ug via INTRAVENOUS
  Administered 2014-08-09: 40 ug via INTRAVENOUS
  Administered 2014-08-09: 80 ug via INTRAVENOUS
  Administered 2014-08-09: 40 ug via INTRAVENOUS
  Administered 2014-08-09: 80 ug via INTRAVENOUS

## 2014-08-09 MED ORDER — MIDAZOLAM HCL 2 MG/2ML IJ SOLN
INTRAMUSCULAR | Status: AC
  Administered 2014-08-09: 2 mg
  Filled 2014-08-09: qty 2

## 2014-08-09 MED ORDER — NEOSTIGMINE METHYLSULFATE 10 MG/10ML IV SOLN
INTRAVENOUS | Status: DC | PRN
  Administered 2014-08-09: 4 mg via INTRAVENOUS

## 2014-08-09 MED ORDER — METHOCARBAMOL 1000 MG/10ML IJ SOLN: 500.0000 mg | Freq: Four times a day (QID) | INTRAVENOUS | Status: DC | PRN

## 2014-08-09 MED ORDER — MIDAZOLAM HCL 5 MG/5ML IJ SOLN
INTRAMUSCULAR | Status: DC | PRN
  Administered 2014-08-09: 2 mg via INTRAVENOUS

## 2014-08-09 MED ORDER — ONDANSETRON HCL 4 MG/2ML IJ SOLN: 4.0000 mg | Freq: Four times a day (QID) | INTRAMUSCULAR | Status: DC | PRN

## 2014-08-09 MED ORDER — PHENYLEPHRINE HCL 10 MG/ML IJ SOLN
20.0000 mg | INTRAVENOUS | Status: DC | PRN
  Administered 2014-08-09: 100 ug/min via INTRAVENOUS

## 2014-08-09 MED ORDER — SODIUM CHLORIDE 0.9 % IV SOLN: INTRAVENOUS | Status: DC

## 2014-08-09 MED ORDER — PANTOPRAZOLE SODIUM 40 MG PO TBEC
40.0000 mg | DELAYED_RELEASE_TABLET | Freq: Two times a day (BID) | ORAL | Status: DC
  Administered 2014-08-09 – 2014-08-10 (×2): 40 mg via ORAL
  Filled 2014-08-09 (×2): qty 1

## 2014-08-09 MED ORDER — MOMETASONE FURO-FORMOTEROL FUM 200-5 MCG/ACT IN AERO
2.0000 | INHALATION_SPRAY | Freq: Two times a day (BID) | RESPIRATORY_TRACT | Status: DC
  Administered 2014-08-09 – 2014-08-10 (×2): 2 via RESPIRATORY_TRACT
  Filled 2014-08-09: qty 8.8

## 2014-08-09 MED ORDER — MIDAZOLAM HCL 2 MG/2ML IJ SOLN
INTRAMUSCULAR | Status: AC
  Filled 2014-08-09: qty 2

## 2014-08-09 MED ORDER — CHLORHEXIDINE GLUCONATE 4 % EX LIQD
60.0000 mL | Freq: Once | CUTANEOUS | Status: DC
  Filled 2014-08-09: qty 60

## 2014-08-09 MED ORDER — OXYCODONE-ACETAMINOPHEN 5-325 MG PO TABS
1.0000 | ORAL_TABLET | ORAL | Status: DC | PRN
  Administered 2014-08-09: 2 via ORAL
  Administered 2014-08-10 (×2): 1 via ORAL
  Administered 2014-08-10: 2 via ORAL
  Filled 2014-08-09 (×2): qty 1
  Filled 2014-08-09 (×2): qty 2

## 2014-08-09 MED ORDER — FENTANYL CITRATE (PF) 100 MCG/2ML IJ SOLN
INTRAMUSCULAR | Status: AC
  Administered 2014-08-09: 50 ug
  Filled 2014-08-09: qty 2

## 2014-08-09 MED ORDER — DEXAMETHASONE SODIUM PHOSPHATE 4 MG/ML IJ SOLN
INTRAMUSCULAR | Status: AC
  Filled 2014-08-09: qty 1

## 2014-08-09 MED ORDER — LORATADINE 10 MG PO TABS: 10.0000 mg | ORAL_TABLET | Freq: Every day | ORAL | Status: DC | PRN

## 2014-08-09 MED ORDER — METHOCARBAMOL 500 MG PO TABS
500.0000 mg | ORAL_TABLET | Freq: Four times a day (QID) | ORAL | Status: DC | PRN
  Administered 2014-08-09 – 2014-08-10 (×2): 500 mg via ORAL
  Filled 2014-08-09 (×2): qty 1

## 2014-08-09 MED ORDER — METOCLOPRAMIDE HCL 5 MG PO TABS: 5.0000 mg | ORAL_TABLET | Freq: Three times a day (TID) | ORAL | Status: DC | PRN

## 2014-08-09 MED ORDER — FENTANYL CITRATE (PF) 100 MCG/2ML IJ SOLN
INTRAMUSCULAR | Status: DC | PRN
  Administered 2014-08-09: 100 ug via INTRAVENOUS

## 2014-08-09 MED ORDER — KETOROLAC TROMETHAMINE 30 MG/ML IJ SOLN: 30.0000 mg | Freq: Once | INTRAMUSCULAR | Status: DC | PRN

## 2014-08-09 MED ORDER — HYDROMORPHONE HCL 1 MG/ML IJ SOLN
0.5000 mg | INTRAMUSCULAR | Status: DC | PRN
  Administered 2014-08-10 (×2): 1 mg via INTRAVENOUS
  Filled 2014-08-09 (×2): qty 1

## 2014-08-09 MED ORDER — DOCUSATE SODIUM 100 MG PO CAPS
100.0000 mg | ORAL_CAPSULE | Freq: Two times a day (BID) | ORAL | Status: DC
  Administered 2014-08-09 – 2014-08-10 (×2): 100 mg via ORAL
  Filled 2014-08-09 (×2): qty 1

## 2014-08-09 MED ORDER — MEPERIDINE HCL 25 MG/ML IJ SOLN: 6.2500 mg | INTRAMUSCULAR | Status: DC | PRN

## 2014-08-09 MED ORDER — GLYCOPYRROLATE 0.2 MG/ML IJ SOLN
INTRAMUSCULAR | Status: AC
  Filled 2014-08-09: qty 3

## 2014-08-09 MED ORDER — VANCOMYCIN HCL 1000 MG IV SOLR
1000.0000 mg | INTRAVENOUS | Status: DC | PRN
  Administered 2014-08-09: 1500 mg via INTRAVENOUS

## 2014-08-09 MED ORDER — LACTATED RINGERS IV SOLN
INTRAVENOUS | Status: DC
  Administered 2014-08-09 (×3): via INTRAVENOUS

## 2014-08-09 MED ORDER — ONDANSETRON HCL 4 MG PO TABS: 4.0000 mg | ORAL_TABLET | Freq: Four times a day (QID) | ORAL | Status: DC | PRN

## 2014-08-09 MED ORDER — PROPOFOL 10 MG/ML IV BOLUS
INTRAVENOUS | Status: DC | PRN
  Administered 2014-08-09: 150 mg via INTRAVENOUS
  Administered 2014-08-09: 50 mg via INTRAVENOUS

## 2014-08-09 MED ORDER — HYDROMORPHONE HCL 1 MG/ML IJ SOLN: 0.2500 mg | INTRAMUSCULAR | Status: DC | PRN

## 2014-08-09 MED ORDER — LIDOCAINE HCL (CARDIAC) 20 MG/ML IV SOLN
INTRAVENOUS | Status: DC | PRN
  Administered 2014-08-09: 50 mg via INTRAVENOUS

## 2014-08-09 SURGICAL SUPPLY — 71 items
ANCH SUT 2 FT CRKSCW 14.7 STRL (Anchor) ×2 IMPLANT
ANCH SUT SWLK 19.1X4.75 VT (Anchor) ×2 IMPLANT
ANCHOR CORKSCREW BIO 5.5 FT (Anchor) ×2 IMPLANT
ANCHOR PEEK 4.75X19.1 SWLK C (Anchor) ×2 IMPLANT
APL SKNCLS STERI-STRIP NONHPOA (GAUZE/BANDAGES/DRESSINGS) ×1
BENZOIN TINCTURE PRP APPL 2/3 (GAUZE/BANDAGES/DRESSINGS) ×2 IMPLANT
BIT DRILL TAK (DRILL) IMPLANT
BLADE CUDA 5.5 (BLADE) IMPLANT
BLADE GREAT WHITE 4.2 (BLADE) ×2 IMPLANT
BLADE LONG MED 31X9 (MISCELLANEOUS) ×1 IMPLANT
BLADE SURG 11 STRL SS (BLADE) ×2 IMPLANT
BUR EGG ELITE 5.0 (BURR) ×2 IMPLANT
BUR OVAL 6.0 (BURR) ×2 IMPLANT
CANNULA SHOULDER 7CM (CANNULA) ×2 IMPLANT
CLSR STERI-STRIP ANTIMIC 1/2X4 (GAUZE/BANDAGES/DRESSINGS) ×1 IMPLANT
COVER SURGICAL LIGHT HANDLE (MISCELLANEOUS) ×2 IMPLANT
DRAPE STERI 35X30 U-POUCH (DRAPES) ×2 IMPLANT
DRAPE SURG 17X23 STRL (DRAPES) ×2 IMPLANT
DRAPE U-SHAPE 47X51 STRL (DRAPES) ×2 IMPLANT
DRILL TAK (DRILL)
DRSG ADAPTIC 3X8 NADH LF (GAUZE/BANDAGES/DRESSINGS) ×2 IMPLANT
DRSG PAD ABDOMINAL 8X10 ST (GAUZE/BANDAGES/DRESSINGS) ×3 IMPLANT
DURAPREP 26ML APPLICATOR (WOUND CARE) ×2 IMPLANT
GAUZE SPONGE 4X4 12PLY STRL (GAUZE/BANDAGES/DRESSINGS) ×3 IMPLANT
GLOVE BIOGEL PI IND STRL 8 (GLOVE) ×4 IMPLANT
GLOVE BIOGEL PI INDICATOR 8 (GLOVE) ×4
GLOVE ORTHO TXT STRL SZ7.5 (GLOVE) ×12 IMPLANT
GLOVE SURG ORTHO 8.0 STRL STRW (GLOVE) ×10 IMPLANT
GOWN STRL REUS W/ TWL LRG LVL3 (GOWN DISPOSABLE) ×2 IMPLANT
GOWN STRL REUS W/ TWL XL LVL3 (GOWN DISPOSABLE) ×4 IMPLANT
GOWN STRL REUS W/TWL LRG LVL3 (GOWN DISPOSABLE) ×4
GOWN STRL REUS W/TWL XL LVL3 (GOWN DISPOSABLE) ×8
IV NS IRRIG 3000ML ARTHROMATIC (IV SOLUTION) ×8 IMPLANT
KIT BASIN OR (CUSTOM PROCEDURE TRAY) ×2 IMPLANT
KIT ROOM TURNOVER OR (KITS) ×2 IMPLANT
MANIFOLD NEPTUNE II (INSTRUMENTS) ×2 IMPLANT
NDL SCORPION MULTI FIRE (NEEDLE) IMPLANT
NDL SPNL 18GX3.5 QUINCKE PK (NEEDLE) ×1 IMPLANT
NDL SUT .5 MAYO 1.404X.05X (NEEDLE) ×1 IMPLANT
NEEDLE 22X1 1/2 (OR ONLY) (NEEDLE) ×2 IMPLANT
NEEDLE MAYO TAPER (NEEDLE) ×2
NEEDLE SCORPION MULTI FIRE (NEEDLE) ×2 IMPLANT
NEEDLE SPNL 18GX3.5 QUINCKE PK (NEEDLE) ×2 IMPLANT
NS IRRIG 1000ML POUR BTL (IV SOLUTION) ×2 IMPLANT
PACK SHOULDER (CUSTOM PROCEDURE TRAY) ×2 IMPLANT
PAD ARMBOARD 7.5X6 YLW CONV (MISCELLANEOUS) ×4 IMPLANT
SET ARTHROSCOPY TUBING (MISCELLANEOUS) ×2
SET ARTHROSCOPY TUBING LN (MISCELLANEOUS) ×1 IMPLANT
SPEAR FASTAKII (SLEEVE) IMPLANT
SPONGE LAP 4X18 X RAY DECT (DISPOSABLE) ×4 IMPLANT
SUCTION FRAZIER TIP 10 FR DISP (SUCTIONS) ×2 IMPLANT
SUT ETHIBOND NAB CT1 #1 30IN (SUTURE) IMPLANT
SUT ETHILON 3 0 PS 1 (SUTURE) ×2 IMPLANT
SUT ETHILON 4 0 PS 2 18 (SUTURE) ×2 IMPLANT
SUT FIBERWIRE #2 38 T-5 BLUE (SUTURE)
SUT FIBERWIRE 2-0 18 17.9 3/8 (SUTURE) ×2
SUT MNCRL AB 3-0 PS2 18 (SUTURE) ×2 IMPLANT
SUT TIGER TAPE 7 IN WHITE (SUTURE) IMPLANT
SUT VIC AB 0 CT1 27 (SUTURE)
SUT VIC AB 0 CT1 27XBRD ANBCTR (SUTURE) IMPLANT
SUT VIC AB 2-0 CT1 27 (SUTURE)
SUT VIC AB 2-0 CT1 TAPERPNT 27 (SUTURE) IMPLANT
SUTURE FIBERWR #2 38 T-5 BLUE (SUTURE) IMPLANT
SUTURE FIBERWR 2-0 18 17.9 3/8 (SUTURE) IMPLANT
SYR 20ML ECCENTRIC (SYRINGE) ×2 IMPLANT
SYR CONTROL 10ML LL (SYRINGE) ×2 IMPLANT
TAPE FIBER 2MM 7IN #2 BLUE (SUTURE) ×6 IMPLANT
TOWEL OR 17X24 6PK STRL BLUE (TOWEL DISPOSABLE) ×2 IMPLANT
TOWEL OR 17X26 10 PK STRL BLUE (TOWEL DISPOSABLE) ×2 IMPLANT
WAND HAND CNTRL MULTIVAC 90 (MISCELLANEOUS) IMPLANT
WATER STERILE IRR 1000ML POUR (IV SOLUTION) ×2 IMPLANT

## 2014-08-09 NOTE — Plan of Care (Signed)
Problem: Consults Goal: Diagnosis - Shoulder Surgery Outcome: Completed/Met Date Met:  08/09/14 Total Shoulder Arthroplasty right

## 2014-08-09 NOTE — Interval H&P Note (Signed)
History and Physical Interval Note:  08/09/2014 7:26 AM  Beth Rodriguez  has presented today for surgery, with the diagnosis of RIGHT SHOULDER OA AND ROTATOR CUFF TEAR  The various methods of treatment have been discussed with the patient and family. After consideration of risks, benefits and other options for treatment, the patient has consented to  Procedure(s): SHOULDER ARTHROSCOPY WITH OPEN ROTATOR CUFF REPAIR AND DISTAL CLAVICLE ACROMINECTOMY (Right) as a surgical intervention .  The patient's history has been reviewed, patient examined, no change in status, stable for surgery.  I have reviewed the patient's chart and labs.  Questions were answered to the patient's satisfaction.     [name],W D

## 2014-08-09 NOTE — Progress Notes (Signed)
Dr Ola Spurr at bedside-he had discussed with Dr French Ana pt staying overnight in hospital due to her OSA. She says she does not use a CPAP. Josh PA updated, and he will put orders in for pt to be admitted overnight OBS.

## 2014-08-09 NOTE — Brief Op Note (Signed)
08/09/2014  1:26 PM  PATIENT:  [name]  54 y.o. female  PRE-OPERATIVE DIAGNOSIS:  RIGHT SHOULDER OA AND ROTATOR CUFF TEAR  POST-OPERATIVE DIAGNOSIS:  RIGHT SHOULDER OA AND ROTATOR CUFF TEAR  PROCEDURE:  Procedure(s): SHOULDER ARTHROSCOPY WITH OPEN ROTATOR CUFF REPAIR AND DISTAL CLAVICLE ACROMINECTOMY (Right)  SURGEON:  Surgeon(s) and Role:    * Earlie Server, MD - Primary  PHYSICIAN ASSISTANT: Chriss Czar, PA-C  ASSISTANTS:   ANESTHESIA:   regional and general  EBL:  Total I/O In: 900 [I.V.:900] Out: -   BLOOD ADMINISTERED:none  DRAINS: none   LOCAL MEDICATIONS USED:  NONE  SPECIMEN:  No Specimen  DISPOSITION OF SPECIMEN:  N/A  COUNTS:  YES  TOURNIQUET:  * No tourniquets in log *  DICTATION: .Other Dictation: Dictation Number unknown  PLAN OF CARE: Discharge to home after PACU  PATIENT DISPOSITION:  PACU - hemodynamically stable.   Delay start of Pharmacological VTE agent (>24hrs) due to surgical blood loss or risk of bleeding: yes

## 2014-08-09 NOTE — Transfer of Care (Signed)
Immediate Anesthesia Transfer of Care Note  Patient: Beth Rodriguez  Procedure(s) Performed: Procedure(s): SHOULDER ARTHROSCOPY WITH OPEN ROTATOR CUFF REPAIR AND DISTAL CLAVICLE ACROMINECTOMY (Right)  Patient Location: PACU  Anesthesia Type:General and Regional  Level of Consciousness: awake, patient cooperative and responds to stimulation  Airway & Oxygen Therapy: Patient Spontanous Breathing and Patient connected to face mask oxygen  Post-op Assessment: Report given to RN and Post -op Vital signs reviewed and stable  Post vital signs: Reviewed and stable  Last Vitals:  Filed Vitals:   08/09/14 1053  BP:   Pulse: 90  Temp:   Resp: 24    Complications: No apparent anesthesia complications

## 2014-08-09 NOTE — Anesthesia Preprocedure Evaluation (Addendum)
Anesthesia Evaluation  Patient identified by MRN, date of birth, ID band Patient awake    Reviewed: Allergy & Precautions, NPO status , Patient's Chart, lab work & pertinent test results  Airway Mallampati: III  TM Distance: >3 FB Neck ROM: Full    Dental no notable dental hx.    Pulmonary shortness of breath, asthma , sleep apnea ,  breath sounds clear to auscultation  Pulmonary exam normal       Cardiovascular hypertension, Pt. on medications Rhythm:Regular Rate:Normal     Neuro/Psych negative neurological ROS  negative psych ROS   GI/Hepatic Neg liver ROS, GERD-  ,  Endo/Other  Hypothyroidism Morbid obesity  Renal/GU negative Renal ROS     Musculoskeletal  (+) Arthritis -,   Abdominal (+) + obese,   Peds  Hematology negative hematology ROS (+)   Anesthesia Other Findings   Reproductive/Obstetrics negative OB ROS                            Anesthesia Physical Anesthesia Plan  ASA: III  Anesthesia Plan: General and Regional   Post-op Pain Management:    Induction: Intravenous  Airway Management Planned: Oral ETT  Additional Equipment: None  Intra-op Plan:   Post-operative Plan: Extubation in OR  Informed Consent: I have reviewed the patients History and Physical, chart, labs and discussed the procedure including the risks, benefits and alternatives for the proposed anesthesia with the patient or authorized representative who has indicated his/her understanding and acceptance.   Dental advisory given  Plan Discussed with: CRNA  Anesthesia Plan Comments:         Anesthesia Quick Evaluation

## 2014-08-09 NOTE — Anesthesia Postprocedure Evaluation (Signed)
  Anesthesia Post-op Note  Patient: Beth Rodriguez  Procedure(s) Performed: Procedure(s): SHOULDER ARTHROSCOPY WITH OPEN ROTATOR CUFF REPAIR AND DISTAL CLAVICLE ACROMINECTOMY (Right)  Patient Location: PACU  Anesthesia Type:General and block  Level of Consciousness: awake and alert   Airway and Oxygen Therapy: Patient Spontanous Breathing  Post-op Pain: none  Post-op Assessment: Post-op Vital signs reviewed, Patient's Cardiovascular Status Stable and Respiratory Function Stable  Post-op Vital Signs: Reviewed  Filed Vitals:   08/09/14 1400  BP:   Pulse: 63  Temp:   Resp: 18    Complications: No apparent anesthesia complications

## 2014-08-09 NOTE — Discharge Instructions (Signed)
Diet: As you were doing prior to hospitalization   Activity: Increase activity slowly as tolerated  No lifting or driving for 6 weeks   Shower: May shower without a dressing on post op day #3, NO SOAKING in tub   Dressing: You may change your dressing on post op day #3.  Then change the dressing daily with sterile 4"x4"s gauze dressing   Weight Bearing: nonweight bearing right arm in sling with pillow.  May remove to shower and do pendulum exercises, keep arm close to body.  To prevent constipation: you may use a stool softener such as -  Colace ( over the counter) 100 mg by mouth twice a day  Drink plenty of fluids ( prune juice may be helpful) and high fiber foods  Miralax ( over the counter) for constipation as needed.   Precautions: If you experience chest pain or shortness of breath - call 911 immediately For transfer to the hospital emergency department!!  If you develop a fever greater that 101 F, purulent drainage from wound, increased redness or drainage from wound, or calf pain -- Call the office   Follow- Up Appointment: Please call for an appointment to be seen in 1 weeks  [place] - [phone]

## 2014-08-09 NOTE — Anesthesia Procedure Notes (Signed)
Procedure Name: Intubation Date/Time: 08/09/2014 11:53 AM Performed by: Gershon Mussel, Tyberius Ryner Pre-anesthesia Checklist: Patient identified, Patient being monitored, Timeout performed, Emergency Drugs available and Suction available Patient Re-evaluated:Patient Re-evaluated prior to inductionOxygen Delivery Method: Circle System Utilized Preoxygenation: Pre-oxygenation with 100% oxygen Intubation Type: IV induction Ventilation: Mask ventilation without difficulty Laryngoscope Size: Miller and 2 Grade View: Grade I Tube type: Oral Tube size: 7.0 mm Number of attempts: 1 Airway Equipment and Method: Stylet Placement Confirmation: ETT inserted through vocal cords under direct vision,  positive ETCO2 and breath sounds checked- equal and bilateral Secured at: 21 cm Tube secured with: Tape Dental Injury: Teeth and Oropharynx as per pre-operative assessment

## 2014-08-09 NOTE — H&P (View-Only) (Signed)
Beth Rodriguez is an 54 y.o. female.   Chief Complaint: right shoulder pain HPI: Patient recently of Dr. Lynann Bologna who is nonoperative at this point.  History of trauma, pulling a chair and heard a pop in her shoulder.  Sees Dr. Gilford Rile in the [place] area.  Comes in worked up with a severe rotator cuff tear with a large amount of retraction with two-tendon tear.  Does have some AC changes and type 2 acromion.  She has significant pain, pain at night.    Her past medical history is remarkable for asthma as well as sleep apnea although she states she was unable to use the device she was given for the sleep apnea.  She has had cataract surgery.  Has a history of high blood pressure as well.  Medications include losartan, levothyroxine, pantoprazole, and prednisone 5 mg.  She has been cut back on her prednisone.  She is on a relatively new asthma drug which she has mentioned to me does not preclude her from having surgery although obviously prior to any surgery we will need to get clearance from her medical doctor as well as her allergist.  She did have a blood clot with previous knee surgery within the last 10 years.  Family history positive for heart disease in mother and father.  Past Medical History  Diagnosis Date  . Asthma   . Hypertension   . Embolism - blood clot 2007    right leg hx of  . Thyroid disease     hypothyroidism  . Enlarged heart   . Carpal tunnel syndrome on right   . Sleep apnea     last sleep study over five years  . Shortness of breath dyspnea     with exertion  . Hypothyroidism   . GERD (gastroesophageal reflux disease)   . Arthritis     Past Surgical History  Procedure Laterality Date  . Knee surgery  2007    right knee  . Sinus surgery with instatrak      ???  . Carpal tunnel release Right   . Eye surgery Bilateral     cataract removal     Family History  Problem Relation Age of Onset  . Hypertension Mother   . Hypertension Sister   . Diabetes Sister    . Hyperlipidemia Sister   . Cancer Sister     skin cancer  . Carpal tunnel syndrome Sister   . Hypertension Daughter   . Carpal tunnel syndrome Daughter   . Hypertension Brother    Social History:  reports that she has never smoked. She does not have any smokeless tobacco history on file. She reports that she does not drink alcohol or use illicit drugs.  Allergies:  Allergies  Allergen Reactions  . Penicillins Hives  . Theophylline     REACTION: headaches     (Not in a hospital admission)  No results found for this or any previous visit (from the past 48 hour(s)). No results found.  Review of Systems  Constitutional: Negative.   HENT: Negative.   Eyes: Negative.   Respiratory: Negative.   Cardiovascular: Negative.   Gastrointestinal: Positive for heartburn. Negative for nausea, vomiting, abdominal pain, diarrhea, constipation, blood in stool and melena.  Genitourinary: Negative.   Musculoskeletal: Positive for myalgias and joint pain.  Skin: Negative.   Neurological: Negative.   Endo/Heme/Allergies: Negative.   Psychiatric/Behavioral: Negative.     There were no vitals taken for this visit. Physical Exam  Constitutional: She is oriented to person, place, and time. She appears well-developed and well-nourished. No distress.  HENT:  Head: Normocephalic and atraumatic.  Nose: Nose normal.  Eyes: Conjunctivae and EOM are normal. Pupils are equal, round, and reactive to light.  Neck: Normal range of motion. Neck supple.  Cardiovascular: Normal rate and intact distal pulses.   Respiratory: Effort normal. No respiratory distress.  GI: Soft. She exhibits no distension. There is no tenderness.  Musculoskeletal:       Right shoulder: She exhibits decreased range of motion, tenderness, pain and decreased strength.  Right shoulder significant painful and weak abduction.  She has positive impingement sign, positive empty can test.  Some tenderness over the [facility] joint.   Neurological: She is alert and oriented to person, place, and time.  Skin: Skin is warm and dry. No rash noted. No erythema.  Psychiatric: She has a normal mood and affect. Her behavior is normal.     Assessment/Plan Right shoulder rotator cuff tear  Some concern about the size of the tear relative to repairing it, but hopefully given her age and the fact that it appears to be an acute tear, we should be able to get this fixed unless it is more of a chronic issue and that the recent event only drew her attention to it, so to speak.  PLAN: 1. I think, although it is probably not extremely risky based on her history for an upper extremity clot or a lower extremity clot, with that medical history that she has, as well as the history of a clot, we will probably consider short-term anticoagulation prophylaxis with either Eliquis or Xarelto. 2. Medical clearance from her medical doctor, Dr. Bea Graff, as well as her allergist.  3. We would need to look at a general anesthetic with a possible nerve block.  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  She has been told they may accept her at light duty.  I would anticipate a minimum of 4-6 works though out of work even if she was going to do light duty.   Chriss Czar 08/08/2014, 1:23 PM

## 2014-08-10 DIAGNOSIS — M75121 Complete rotator cuff tear or rupture of right shoulder, not specified as traumatic: Secondary | ICD-10-CM | POA: Diagnosis not present

## 2014-08-11 NOTE — Discharge Summary (Signed)
PATIENT ID: Beth Rodriguez        MRN:  [id number]          DOB/AGE: 04-19-1960 / 54 y.o.    DISCHARGE SUMMARY  ADMISSION DATE:    08/09/2014 DISCHARGE DATE:  08/10/14  ADMISSION DIAGNOSIS: RIGHT SHOULDER OA AND ROTATOR CUFF TEAR    DISCHARGE DIAGNOSIS:  RIGHT SHOULDER OA AND ROTATOR CUFF TEAR    ADDITIONAL DIAGNOSIS: Principal Problem:   Right rotator cuff tear  Past Medical History  Diagnosis Date  . Asthma   . Hypertension   . Embolism - blood clot 2007    right leg hx of  . Thyroid disease     hypothyroidism  . Enlarged heart   . Carpal tunnel syndrome on right   . Sleep apnea     last sleep study over five years  . Shortness of breath dyspnea     with exertion  . Hypothyroidism   . GERD (gastroesophageal reflux disease)   . Arthritis     PROCEDURE: Procedure(s): SHOULDER ARTHROSCOPY WITH OPEN ROTATOR CUFF REPAIR AND DISTAL CLAVICLE ACROMINECTOMY Right on 08/09/2014  CONSULTS: none     HISTORY:  See H&P in chart  HOSPITAL COURSE:  TERRESA Rodriguez is a 54 y.o. admitted on 08/09/2014 and found to have a diagnosis of RIGHT SHOULDER OA [place].  After appropriate laboratory studies were obtained  they were taken to the operating room on 08/09/2014 and underwent  Procedure(s): SHOULDER ARTHROSCOPY WITH OPEN ROTATOR CUFF REPAIR AND DISTAL CLAVICLE ACROMINECTOMY  Right.   They were given perioperative antibiotics:  Anti-infectives    Start     Dose/Rate Route Frequency Ordered Stop   08/09/14 0945  vancomycin (VANCOCIN) 1,500 mg in sodium chloride 0.9 % 500 mL IVPB  Status:  Discontinued     1,500 mg 250 mL/hr over 120 Minutes Intravenous To ShortStay Surgical 08/08/14 1329 08/09/14 1527    .  Tolerated the procedure well.  Patient with history of sleep apnea and does not use home cpap overnight obs recommended by anesthesia.    POD #1, allowed out of bed to a chair.  IV saline locked.  O2 discontionued.   The patient was discharged on 1 day post op  in  Stable condition.  Blood products given:none  DIAGNOSTIC STUDIES: Recent vital signs: No data found.      Recent laboratory studies:  Recent Labs  08/05/14 0954  WBC 3.7*  HGB 12.9  HCT 40.5  PLT 273    Recent Labs  08/05/14 0954  NA 143  K 4.0  CL 107  CO2 28  BUN 8  CREATININE 0.82  GLUCOSE 87  CALCIUM 9.3   No results found for: INR, PROTIME   Recent Radiographic Studies :  No results found.  DISCHARGE INSTRUCTIONS:   DISCHARGE MEDICATIONS:     Medication List    TAKE these medications        apixaban 2.5 MG Tabs tablet  Commonly known as:  ELIQUIS  Take 2.5 mg by mouth 2 (two) times daily.     DULERA 200-5 MCG/ACT Aero  Generic drug:  mometasone-formoterol  Inhale 2 puffs into the lungs 2 (two) times daily.     loratadine 10 MG tablet  Commonly known as:  CLARITIN  Take 10 mg by mouth daily as needed for allergies.     losartan 100 MG tablet  Commonly known as:  COZAAR  Take 100 mg by mouth daily.  methocarbamol 500 MG tablet  Commonly known as:  ROBAXIN  Take 500 mg by mouth every 6 (six) hours as needed for muscle spasms.     MULTIVITAMIN PO  Take 1 tablet by mouth daily.     oxyCODONE-acetaminophen 5-325 MG per tablet  Commonly known as:  PERCOCET/ROXICET  Take 1-2 tablets by mouth every 6 (six) hours as needed for severe pain (pain).     pantoprazole 40 MG tablet  Commonly known as:  PROTONIX  Take 40 mg by mouth 2 (two) times daily.     predniSONE 5 MG tablet  Commonly known as:  DELTASONE  Take 5 mg by mouth daily with breakfast.     PROAIR HFA 108 (90 BASE) MCG/ACT inhaler  Generic drug:  albuterol  Inhale 2 puffs into the lungs as needed for wheezing or shortness of breath.     PROVENTIL (2.5 MG/3ML) 0.083% nebulizer solution  Generic drug:  albuterol  Take 2.5 mg by nebulization every 6 (six) hours as needed for shortness of breath.     QVAR 80 MCG/ACT inhaler  Generic drug:  beclomethasone  Inhale 2 puffs  into the lungs 2 (two) times daily.     SYNTHROID 75 MCG tablet  Generic drug:  levothyroxine  Take 75 mcg by mouth daily.        FOLLOW UP VISIT:       Follow-up Information    Follow up with CAFFREY JR,W D, MD. Schedule an appointment as soon as possible for a visit in 1 week.   Specialty:  Orthopedic Surgery   Contact information:   [place] [address] [phone]       DISPOSITION:   Home  CONDITION:  Stable   Chriss Czar, PA-C  08/11/2014 8:59 AM

## 2014-08-12 ENCOUNTER — Encounter (HOSPITAL_COMMUNITY): Payer: Self-pay | Admitting: Orthopedic Surgery

## 2014-08-12 NOTE — Op Note (Signed)
NAME:  Beth Rodriguez, Beth Rodriguez NO.:  [id number]  MEDICAL RECORD NO.:  [id number]  LOCATION:                                FACILITY:  [facility]  PHYSICIAN:  Lockie Pares, M.D.    DATE OF BIRTH:  09-Jun-1960  DATE OF PROCEDURE:  08/09/2014 DATE OF DISCHARGE:  08/09/2014                              OPERATIVE REPORT   INDICATIONS:  A 54 year old with morbid obesity, sleep apnea, and pulmonary issues, who had a large 2 tendon rotator cuff tear.  Due to her health issues, thought that  this would require hospital setting for surgery.  PREOPERATIVE DIAGNOSIS:  Massive retracted 2 tendon rotator cuff tear with involvement of supraspinatus and infraspinatus, 1-2; severe impingement, 4-3; severe acromioclavicular joint arthritis, 5; degenerative tearing of the anterior, superior, and inferior labrum.  POSTOPERATIVE DIAGNOSIS:  Massive retracted 2 tendon rotator cuff tear with involvement of supraspinatus and infraspinatus, 1-2; severe impingement, 4-3; severe acromioclavicular joint arthritis, 5; degenerative tearing of the anterior, superior, and inferior labrum.  OPERATIONS: 1. Open rotator cuff repair with acromioplasty (chronic). 2. Open distal clavicle. 3. Arthroscopic debridement (extensive).  SURGEON:  Lockie Pares, M.D.  ASSISTANT:  Chriss Czar, PA-C.  ANESTHESIA:  General with nerve block.  DESCRIPTION OF PROCEDURE:  General anesthetic with nerve block preoperatively in beach chair positioning.  We performed arthroscopic in the beach chair with posterolateral and anterior portals.  Extensive tearing in the anterior and superior and posterior labrum was debrided aggressively.  There was no glenohumeral degenerative change.  There was a double tendon retracted tear of the rotator cuff, probably over 5-7 cm.  We elected to fix this open after aggressive debridement intra- articularly.  We did incision bisecting the acromial AC interval, resected a very  hypertrophic AC joint from a distance about 1 cm and 1.5 cm of the clavicle with severe impingement from the leading edge of the acromion, required a partial acromionectomy at about 8-10 mm of the anterior leading edge revealing the cuff tear, retracted __________ the tissue did mobilize well, there was a small stump left on the tuberosity.  We burred the tuberosity.  Initially, we were going to fix this with some medial lateral row techniques with SwiveLocks, but due to poor bone quality, we placed 1 SwiveLock on a medial row and it readily pulled through at a 4, 5, and 7 level and rather than chance of the SwiveLocks, even larger ones were pulling out.  I elected then to go to a Bio-Corkscrew __________.  We placed 2 Bio-Corkscrews over burred tuberosity for total of 4 sutures, which created a very good repair, and then, we over sewed the leading edge on the stump with a 2-0 FiberWire.  We meticulously repaired the deltoid with #1 Ethibond, 0 Vicryl, 2-0 Vicryl, Monocryl in the skin.  Lightly compressive sterile dressing __________ were applied and taken to the recovery room in stable condition.     Lockie Pares, M.D.     WDC/MEDQ  D:  08/09/2014  T:  08/10/2014  Job:  [id number]

## 2014-12-12 DIAGNOSIS — J309 Allergic rhinitis, unspecified: Secondary | ICD-10-CM

## 2014-12-12 HISTORY — DX: Allergic rhinitis, unspecified: J30.9

## 2014-12-20 ENCOUNTER — Other Ambulatory Visit: Payer: Self-pay

## 2014-12-20 MED ORDER — MEPOLIZUMAB 100 MG ~~LOC~~ SOLR
100.0000 mg | SUBCUTANEOUS | Status: DC
  Administered 2015-03-10 – 2016-03-17 (×13): 100 mg via SUBCUTANEOUS

## 2015-01-09 ENCOUNTER — Ambulatory Visit (INDEPENDENT_AMBULATORY_CARE_PROVIDER_SITE_OTHER): Payer: BLUE CROSS/BLUE SHIELD | Admitting: Allergy and Immunology

## 2015-01-09 VITALS — BP 122/70 | HR 80 | Resp 16 | Ht 61.0 in | Wt 266.0 lb

## 2015-01-09 DIAGNOSIS — J4551 Severe persistent asthma with (acute) exacerbation: Secondary | ICD-10-CM

## 2015-01-09 DIAGNOSIS — J4550 Severe persistent asthma, uncomplicated: Secondary | ICD-10-CM | POA: Insufficient documentation

## 2015-01-09 HISTORY — DX: Severe persistent asthma, uncomplicated: J45.50

## 2015-01-09 MED ORDER — LEVOFLOXACIN 500 MG PO TABS: 500.0000 mg | ORAL_TABLET | Freq: Every day | ORAL | Status: DC

## 2015-01-09 MED ORDER — METHYLPREDNISOLONE ACETATE 80 MG/ML IJ SUSP
80.0000 mg | Freq: Once | INTRAMUSCULAR | Status: AC
  Administered 2015-01-09: 80 mg via INTRAMUSCULAR

## 2015-01-09 MED ORDER — MEPOLIZUMAB 100 MG ~~LOC~~ SOLR
100.0000 mg | SUBCUTANEOUS | Status: AC
  Administered 2015-01-09: 100 mg via SUBCUTANEOUS

## 2015-01-09 NOTE — Patient Instructions (Signed)
  1. levaquin 500 one tablet one time per day for 10 days  2. Depomedrol 80 now  3. Continue Dulera 200 and Qvar 80 two puffs two times per day  4. Continue pantoprazole and ranitidine  5. Continue Nucala and Epi-pen  6. Get flu vaccine  7. Return in 12 weeks or earlier if problem.

## 2015-01-09 NOTE — Assessment & Plan Note (Signed)
1. levaquin 500 one tablet one time per day for 10 days  2. Depomedrol 80 now  3. Continue Dulera 200 and Qvar 80 two puffs two times per day  4. Continue pantoprazole and ranitidine  5. Continue Nucala and Epi-pen  6. Get flu vaccine  7. Return in 12 weeks or earlier if problem.

## 2015-01-10 ENCOUNTER — Encounter: Payer: Self-pay | Admitting: Allergy and Immunology

## 2015-01-10 NOTE — Progress Notes (Signed)
Subjective  Beth Rodriguez is a 54 y.o. female who returns to the [facility] in re-evaluation of the following:  Problem  Severe Persistent Asthma   Maysel presents today with a one week history of wheezing and DOE and slight cough requiring her to use a SABA a few times per day. In addition, has nasal congestion and green rhinorrhea. She has been off her prednisone for over two months and was doing very well until this flare up. She continue on her Nucala and her large collection of medication for inflammation and reflux. Her reflux is under control     Past Medical History  Diagnosis Date  . Asthma   . Hypertension   . Embolism - blood clot 2007    right leg hx of  . Thyroid disease     hypothyroidism  . Enlarged heart   . Carpal tunnel syndrome on right   . Sleep apnea     last sleep study over five years  . Shortness of breath dyspnea     with exertion  . Hypothyroidism   . GERD (gastroesophageal reflux disease)   . Arthritis     Past Surgical History  Procedure Laterality Date  . Knee surgery  2007    right knee  . Sinus surgery with instatrak      ???  . Carpal tunnel release Right   . Eye surgery Bilateral     cataract removal   . Shoulder arthroscopy with open rotator cuff repair and distal clavicle acrominectomy Right 08/09/2014    Procedure: SHOULDER ARTHROSCOPY WITH OPEN ROTATOR CUFF REPAIR AND DISTAL CLAVICLE ACROMINECTOMY;  Surgeon: Earlie Server, MD;  Location: [facility];  Service: Orthopedics;  Laterality: Right;    Current Outpatient Prescriptions on File Prior to Visit  Medication Sig Dispense Refill  . albuterol (PROAIR HFA) 108 (90 BASE) MCG/ACT inhaler Inhale 2 puffs into the lungs as needed for wheezing or shortness of breath.     Marland Kitchen albuterol (PROVENTIL) (2.5 MG/3ML) 0.083% nebulizer solution Take 2.5 mg by nebulization every 6 (six) hours as needed for shortness of breath.     . levothyroxine (SYNTHROID) 75 MCG tablet Take 75 mcg by mouth  daily.      Marland Kitchen loratadine (CLARITIN) 10 MG tablet Take 10 mg by mouth daily as needed for allergies.    Marland Kitchen losartan (COZAAR) 100 MG tablet Take 100 mg by mouth daily.  1  . Mometasone Furo-Formoterol Fum (DULERA) 200-5 MCG/ACT AERO Inhale 2 puffs into the lungs 2 (two) times daily.      . Multiple Vitamins-Minerals (MULTIVITAMIN PO) Take 1 tablet by mouth daily.    Marland Kitchen oxyCODONE-acetaminophen (PERCOCET/ROXICET) 5-325 MG per tablet Take 1-2 tablets by mouth every 6 (six) hours as needed for severe pain (pain).    . pantoprazole (PROTONIX) 40 MG tablet Take 40 mg by mouth 2 (two) times daily.      Marland Kitchen QVAR 80 MCG/ACT inhaler Inhale 2 puffs into the lungs 2 (two) times daily.     Marland Kitchen apixaban (ELIQUIS) 2.5 MG TABS tablet Take 2.5 mg by mouth 2 (two) times daily.    . budesonide-formoterol (SYMBICORT) 160-4.5 MCG/ACT inhaler Inhale 2 puffs into the lungs 2 (two) times daily.    . methocarbamol (ROBAXIN) 500 MG tablet Take 500 mg by mouth every 6 (six) hours as needed for muscle spasms.    . predniSONE (DELTASONE) 5 MG tablet Take 5 mg by mouth daily with breakfast.    . ranitidine (  ZANTAC) 300 MG tablet Take 300 mg by mouth at bedtime.     Current Facility-Administered Medications on File Prior to Visit  Medication Dose Route Frequency Provider Last Rate Last Dose  . Mepolizumab SOLR 100 mg  100 mg Subcutaneous Q28 days Jiles Prows, MD        Meds ordered this encounter  Medications  . Mepolizumab SOLR 100 mg    Sig:   . NUCALA 100 MG SOLR    Sig:   . levothyroxine (SYNTHROID, LEVOTHROID) 88 MCG tablet    Sig: Take 88 mcg by mouth daily.    Refill:  5  . levofloxacin (LEVAQUIN) 500 MG tablet    Sig: Take 1 tablet (500 mg total) by mouth daily.    Dispense:  10 tablet    Refill:  0  . methylPREDNISolone acetate (DEPO-MEDROL) injection 80 mg    Sig:       Allergies  Allergen Reactions  . Penicillins Hives  . Theophylline     REACTION: headaches    Review of Systems  Constitutional:  Negative for fever, chills, weight loss and malaise/fatigue.  HENT: Negative for congestion, ear pain, hearing loss, nosebleeds, sore throat and tinnitus.   Eyes: Negative for redness.  Respiratory: Positive for cough, shortness of breath and wheezing. Negative for sputum production.   Cardiovascular: Negative for chest pain and leg swelling.  Gastrointestinal: Negative for heartburn, nausea, vomiting, abdominal pain and diarrhea.  Skin: Negative for itching and rash.  Neurological: Negative for dizziness and headaches.     Objective:   Filed Vitals:   01/09/15 1045  BP: 122/70  Pulse: 80  Resp: 16    Physical Exam  Constitutional: She is oriented to person, place, and time and well-developed, well-nourished, and in no distress.  HENT:  Right Ear: External ear normal.  Left Ear: External ear normal.  Nose: Nose normal.  Mouth/Throat: Oropharynx is clear and moist.  Eyes: Conjunctivae are normal.  Neck: No JVD present. No tracheal deviation present. No thyromegaly present.  Cardiovascular: Normal rate, regular rhythm and normal heart sounds.  Exam reveals no gallop.   No murmur heard. Pulmonary/Chest: No stridor. No respiratory distress. She has wheezes. She has no rales. She exhibits no tenderness.  Abdominal: She exhibits no distension and no mass. There is no tenderness. There is no rebound and no guarding.  Musculoskeletal: She exhibits no edema.  Lymphadenopathy:    She has no cervical adenopathy.  Neurological: She is alert and oriented to person, place, and time.  Skin: No rash noted. No erythema. No pallor.    Diagnostics:    Spirometry was performed and demonstrated an FEV1 of 1.62 @ 70% predicted  The patient had an Asthma Control Test with the following results: ACT Total Score: 12.    Assessment and Plan:     Problem List Items Addressed This Visit      Respiratory   Severe persistent asthma - Primary    1. levaquin 500 one tablet one time per day for  10 days  2. Depomedrol 80 now  3. Continue Dulera 200 and Qvar 80 two puffs two times per day  4. Continue pantoprazole and ranitidine  5. Continue Nucala and Epi-pen  6. Get flu vaccine  7. Return in 12 weeks or earlier if problem.       Relevant Medications   Mepolizumab SOLR 100 mg (Completed)   NUCALA 100 MG SOLR   methylPREDNISolone acetate (DEPO-MEDROL) injection 80 mg (Completed)

## 2015-01-13 ENCOUNTER — Encounter: Payer: Self-pay | Admitting: Allergy and Immunology

## 2015-01-22 ENCOUNTER — Telehealth: Payer: Self-pay

## 2015-01-22 NOTE — Telephone Encounter (Signed)
Pt states that she recieved a Depo-medrol injection on 01/09/15. Is some better but still with wheezing during the day and during the night. Slight cought, No mucus production, no fever, no chest tightness. What can she do about the wheezing?

## 2015-01-23 ENCOUNTER — Telehealth: Payer: Self-pay

## 2015-01-23 NOTE — Telephone Encounter (Signed)
Pt informed

## 2015-01-23 NOTE — Telephone Encounter (Signed)
Left message for pt to call.

## 2015-01-23 NOTE — Telephone Encounter (Signed)
Please have patient continue all therapy for an additional week and continue with her NUCALA injection. If not better at end of next week may need additional therapy.

## 2015-02-04 ENCOUNTER — Other Ambulatory Visit: Payer: Self-pay | Admitting: Orthopedic Surgery

## 2015-02-04 DIAGNOSIS — M25561 Pain in right knee: Secondary | ICD-10-CM

## 2015-02-06 ENCOUNTER — Ambulatory Visit (INDEPENDENT_AMBULATORY_CARE_PROVIDER_SITE_OTHER): Payer: BLUE CROSS/BLUE SHIELD

## 2015-02-06 DIAGNOSIS — J4550 Severe persistent asthma, uncomplicated: Secondary | ICD-10-CM | POA: Diagnosis not present

## 2015-02-06 DIAGNOSIS — J4540 Moderate persistent asthma, uncomplicated: Secondary | ICD-10-CM

## 2015-02-12 ENCOUNTER — Other Ambulatory Visit: Payer: BLUE CROSS/BLUE SHIELD

## 2015-02-14 ENCOUNTER — Ambulatory Visit
Admission: RE | Admit: 2015-02-14 | Discharge: 2015-02-14 | Disposition: A | Discharge status: Home / Self Care | Payer: BLUE CROSS/BLUE SHIELD | Source: Ambulatory Visit | Attending: Orthopedic Surgery | Admitting: Orthopedic Surgery

## 2015-02-14 DIAGNOSIS — M25561 Pain in right knee: Secondary | ICD-10-CM

## 2015-03-03 ENCOUNTER — Ambulatory Visit (INDEPENDENT_AMBULATORY_CARE_PROVIDER_SITE_OTHER): Payer: BLUE CROSS/BLUE SHIELD | Admitting: Podiatry

## 2015-03-03 ENCOUNTER — Encounter: Payer: Self-pay | Admitting: Podiatry

## 2015-03-03 VITALS — BP 129/73 | HR 66 | Resp 14

## 2015-03-03 DIAGNOSIS — Q828 Other specified congenital malformations of skin: Secondary | ICD-10-CM | POA: Diagnosis not present

## 2015-03-03 NOTE — Progress Notes (Addendum)
   Subjective:    Patient ID: Beth Rodriguez, female    DOB: [birth year], 54 y.o.   MRN: [id number]  HPI this patient presents to the office with chief complaint of painful calluses on both feet. She states that she has developed painful callus on the inside of her left foot. She states her left foot is very flat and she believes the 2 are related. She also presents with a painful callus on the outside of the midfoot of the right foot. She states all these calluses are painful as she walks and wears her shoes. She relates the previous  Dr. treated these this lesion a year ago and she has not had any problems since that visit.. She presents the office for evaluation and treatment of this condition The patient presents here with B/L side of feet pain.    Review of Systems  All other systems reviewed and are negative.      Objective:   Physical Exam GENERAL APPEARANCE: Alert, conversant. Appropriately groomed. No acute distress.  VASCULAR: Pedal pulses palpable at  [facility] and PT bilateral.  Capillary refill time is immediate to all digits,  Normal temperature gradient.  Digital hair growth is present bilateral  NEUROLOGIC: sensation is normal to 5.07 monofilament at 5/5 sites bilateral.  Light touch is intact bilateral, Muscle strength normal.  MUSCULOSKELETAL: acceptable muscle strength, tone and stability bilateral.  Intrinsic muscluature intact bilateral.  Rectus appearance of foot and digits noted bilateral.   DERMATOLOGIC: skin color, texture, and turgor are within normal limits.  No preulcerative lesions or ulcers  are seen, no interdigital maceration noted.  No open lesions present.  Digital nails are asymptomatic. No drainage noted. Porokeratosis under navicular left foot.  Porokeratosis fifth metabase right foot.         Assessment & Plan:  Porokeratosis B/L  Debridement of porokeratosis B/l  Gardiner Barefoot DPM

## 2015-03-10 ENCOUNTER — Ambulatory Visit (INDEPENDENT_AMBULATORY_CARE_PROVIDER_SITE_OTHER): Payer: BLUE CROSS/BLUE SHIELD

## 2015-03-10 DIAGNOSIS — J4550 Severe persistent asthma, uncomplicated: Secondary | ICD-10-CM | POA: Diagnosis not present

## 2015-03-25 ENCOUNTER — Other Ambulatory Visit: Payer: Self-pay | Admitting: *Deleted

## 2015-03-25 MED ORDER — BECLOMETHASONE DIPROPIONATE 80 MCG/ACT IN AERS: 2.0000 | INHALATION_SPRAY | Freq: Two times a day (BID) | RESPIRATORY_TRACT | Status: DC

## 2015-04-03 ENCOUNTER — Encounter: Payer: Self-pay | Admitting: Allergy and Immunology

## 2015-04-03 ENCOUNTER — Ambulatory Visit (INDEPENDENT_AMBULATORY_CARE_PROVIDER_SITE_OTHER): Payer: BLUE CROSS/BLUE SHIELD | Admitting: Allergy and Immunology

## 2015-04-03 VITALS — BP 112/82 | HR 64 | Resp 16

## 2015-04-03 DIAGNOSIS — J4550 Severe persistent asthma, uncomplicated: Secondary | ICD-10-CM

## 2015-04-03 DIAGNOSIS — K219 Gastro-esophageal reflux disease without esophagitis: Secondary | ICD-10-CM

## 2015-04-03 DIAGNOSIS — J387 Other diseases of larynx: Secondary | ICD-10-CM

## 2015-04-03 DIAGNOSIS — Z92241 Personal history of systemic steroid therapy: Secondary | ICD-10-CM

## 2015-04-03 NOTE — Patient Instructions (Signed)
  1. Continue Dulera 200 two puffs two times per day  2. Decrease Qvar 80 two puffs one times per day  3. Continue pantoprazole and ranitidine  4. Continue Nucala and Epi-pen  5. Get flu vaccine  6. Return in 8 weeks or earlier if problem. Taper?

## 2015-04-03 NOTE — Progress Notes (Signed)
[facility] [place]  Follow-up Note  Refering Provider: [name], MD Primary Provider: Gilford Rile, MD  Subjective:   Beth Rodriguez is a 54 y.o. female who returns to the [facility] [place] in re-evaluation of the following:  HPI Comments:  Beth Rodriguez returns to this clinic on 03 April 2015 in reevaluation of her severe asthma treated with nucala. She is done quite well since her last visit and does not use a short acting bronchodilator and has not required a systemic steroid. She continues on Dulera 200 and Qvar 80 2 inhalations twice a day. Her reflux is under excellent control while using pantoprazole and ranitidine. She's had no problems with her upper airways.   Current Outpatient Prescriptions on File Prior to Visit  Medication Sig Dispense Refill  . albuterol (PROAIR HFA) 108 (90 BASE) MCG/ACT inhaler Inhale 2 puffs into the lungs as needed for wheezing or shortness of breath.     Marland Kitchen albuterol (PROVENTIL) (2.5 MG/3ML) 0.083% nebulizer solution Take 2.5 mg by nebulization every 6 (six) hours as needed for shortness of breath.     . beclomethasone (QVAR) 80 MCG/ACT inhaler Inhale 2 puffs into the lungs 2 (two) times daily. 1 Inhaler 0  . levothyroxine (SYNTHROID, LEVOTHROID) 88 MCG tablet Take 88 mcg by mouth daily.  5  . losartan (COZAAR) 100 MG tablet Take 100 mg by mouth daily.  1  . Mometasone Furo-Formoterol Fum (DULERA) 200-5 MCG/ACT AERO Inhale 2 puffs into the lungs 2 (two) times daily.      . Multiple Vitamins-Minerals (MULTIVITAMIN PO) Take 1 tablet by mouth daily.    Marland Kitchen NUCALA 100 MG SOLR     . pantoprazole (PROTONIX) 40 MG tablet Take 40 mg by mouth 2 (two) times daily.      . ranitidine (ZANTAC) 300 MG tablet Take 300 mg by mouth at bedtime. Reported on 04/03/2015    . apixaban (ELIQUIS) 2.5 MG TABS tablet Take 2.5 mg by mouth 2 (two) times daily. Reported on 04/03/2015    . budesonide-formoterol (SYMBICORT)  160-4.5 MCG/ACT inhaler Inhale 2 puffs into the lungs 2 (two) times daily. Reported on 04/03/2015    . levofloxacin (LEVAQUIN) 500 MG tablet Take 1 tablet (500 mg total) by mouth daily. (Patient not taking: Reported on 04/03/2015) 10 tablet 0  . levothyroxine (SYNTHROID) 75 MCG tablet Take 75 mcg by mouth daily. Reported on 04/03/2015    . loratadine (CLARITIN) 10 MG tablet Take 10 mg by mouth daily as needed for allergies. Reported on 04/03/2015    . methocarbamol (ROBAXIN) 500 MG tablet Take 500 mg by mouth every 6 (six) hours as needed for muscle spasms. Reported on 04/03/2015    . oxyCODONE-acetaminophen (PERCOCET/ROXICET) 5-325 MG per tablet Take 1-2 tablets by mouth every 6 (six) hours as needed for severe pain (pain). Reported on 04/03/2015    . predniSONE (DELTASONE) 5 MG tablet Take 5 mg by mouth daily with breakfast. Reported on 04/03/2015     Current Facility-Administered Medications on File Prior to Visit  Medication Dose Route Frequency Provider Last Rate Last Dose  . Mepolizumab SOLR 100 mg  100 mg Subcutaneous Q28 days Jiles Prows, MD   100 mg at 03/10/15 1037    No orders of the defined types were placed in this encounter.    Past Medical History  Diagnosis Date  . Asthma   . Hypertension   . Embolism - blood clot 2007  right leg hx of  . Thyroid disease     hypothyroidism  . Enlarged heart   . Carpal tunnel syndrome on right   . Sleep apnea     last sleep study over five years  . Shortness of breath dyspnea     with exertion  . Hypothyroidism   . GERD (gastroesophageal reflux disease)   . Arthritis     Past Surgical History  Procedure Laterality Date  . Knee surgery  2007    right knee  . Sinus surgery with instatrak      ???  . Carpal tunnel release Right   . Eye surgery Bilateral     cataract removal   . Shoulder arthroscopy with open rotator cuff repair and distal clavicle acrominectomy Right 08/09/2014    Procedure: SHOULDER ARTHROSCOPY WITH OPEN  ROTATOR CUFF REPAIR AND DISTAL CLAVICLE ACROMINECTOMY;  Surgeon: Earlie Server, MD;  Location: [place];  Service: Orthopedics;  Laterality: Right;    Allergies  Allergen Reactions  . Penicillins Hives  . Theophylline     REACTION: headaches    Review of Systems  Constitutional: Negative.   HENT: Negative.   Eyes: Negative.   Respiratory: Negative.   Cardiovascular: Negative.   Gastrointestinal: Negative.   Musculoskeletal: Negative.   Skin: Negative.   Neurological: Negative.   Hematological: Negative.      Objective:   Filed Vitals:   04/03/15 1138  BP: 112/82  Pulse: 64  Resp: 16          Physical Exam  Constitutional: She appears well-developed and well-nourished. No distress.  HENT:  Head: Normocephalic and atraumatic. Head is without right periorbital erythema and without left periorbital erythema.  Right Ear: Tympanic membrane, external ear and ear canal normal. No drainage or tenderness. No foreign bodies. Tympanic membrane is not injected, not scarred, not perforated, not erythematous, not retracted and not bulging. No middle ear effusion.  Left Ear: Tympanic membrane, external ear and ear canal normal. No drainage or tenderness. No foreign bodies. Tympanic membrane is not injected, not scarred, not perforated, not erythematous, not retracted and not bulging.  No middle ear effusion.  Nose: Nose normal. No mucosal edema, rhinorrhea, nose lacerations or sinus tenderness.  No foreign bodies.  Mouth/Throat: Oropharynx is clear and moist. No oropharyngeal exudate, posterior oropharyngeal edema, posterior oropharyngeal erythema or tonsillar abscesses.  Eyes: Lids are normal. Right eye exhibits no chemosis, no discharge and no exudate. No foreign body present in the right eye. Left eye exhibits no chemosis, no discharge and no exudate. No foreign body present in the left eye. Right conjunctiva is not injected. Left conjunctiva is not injected.  Neck: Neck supple. No  tracheal tenderness present. No tracheal deviation and no edema present. No thyroid mass and no thyromegaly present.  Cardiovascular: Normal rate, regular rhythm, S1 normal and S2 normal.  Exam reveals no gallop.   No murmur heard. Pulmonary/Chest: No accessory muscle usage or stridor. No respiratory distress. She has no wheezes. She has no rhonchi. She has no rales.  Abdominal: Soft.  Musculoskeletal: She exhibits no edema.  Lymphadenopathy:       Head (right side): No tonsillar adenopathy present.       Head (left side): No tonsillar adenopathy present.    She has no cervical adenopathy.  Neurological: She is alert.  Skin: No rash noted. She is not diaphoretic.  Psychiatric: She has a normal mood and affect. Her behavior is normal.    Diagnostics:  Spirometry was performed and demonstrated an FEV1 of 1.85 at 76 % of predicted.  The patient had an Asthma Control Test with the following results:  .    Assessment and Plan:   1. Severe persistent asthma, uncomplicated   2. LPRD (laryngopharyngeal reflux disease)   3. History of systemic steroid therapy      1. Continue Dulera 200 two puffs two times per day  2. Decrease Qvar 80 two puffs one times per day  3. Continue pantoprazole and ranitidine  4. Continue Nucala and Epi-pen  5. Get flu vaccine  6. Return in 8 weeks or earlier if problem. Taper?  [place] has continued to improve ever since we started her nucala. I made an attempt to decrease her inhaled steroid dose as specified above and when she returns to this clinic in 8 weeks I will make an attempt to further consolidate her treatment. It should be noted that this is the first prolonged interval of time in many years and which she has not had to use daily oral steroids to control her asthma. Other than 1 exacerbation in September she's had an excellent 6 months interval while consistently using nucala.   Allena Katz, MD [place]

## 2015-04-09 ENCOUNTER — Ambulatory Visit (INDEPENDENT_AMBULATORY_CARE_PROVIDER_SITE_OTHER): Payer: BLUE CROSS/BLUE SHIELD | Admitting: *Deleted

## 2015-04-09 DIAGNOSIS — J4550 Severe persistent asthma, uncomplicated: Secondary | ICD-10-CM | POA: Diagnosis not present

## 2015-04-22 ENCOUNTER — Other Ambulatory Visit: Payer: Self-pay

## 2015-04-22 MED ORDER — BECLOMETHASONE DIPROPIONATE 80 MCG/ACT IN AERS: 2.0000 | INHALATION_SPRAY | Freq: Two times a day (BID) | RESPIRATORY_TRACT | Status: DC

## 2015-05-01 ENCOUNTER — Telehealth: Payer: Self-pay | Admitting: *Deleted

## 2015-05-01 MED ORDER — PREDNISONE 10 MG PO TABS: 30.0000 mg | ORAL_TABLET | Freq: Every day | ORAL | Status: DC

## 2015-05-01 MED ORDER — AZITHROMYCIN 500 MG PO TABS: 500.0000 mg | ORAL_TABLET | Freq: Every day | ORAL | Status: DC

## 2015-05-01 NOTE — Telephone Encounter (Signed)
Please have patient increase Qvar to 3 puffs three times a day while sick, take prednisone 30mg  once a day for three days and use azithromycin 500 once a day for three days.

## 2015-05-01 NOTE — Addendum Note (Signed)
Addended by: Carin Hock on: 05/01/2015 05:05 PM   Modules accepted: Orders

## 2015-05-01 NOTE — Telephone Encounter (Signed)
PATIENT ADVISES DULERA 2 PUFF AND QVAR 80 1 PUFF BOTH TWICE DAILY AND PANTOPRAZOLE BID. STARTED WITH SINUS SX AND COUGH LAST WEEK NOW COUGHING UP GREEN SPUTUM AND RATTLING COUGH

## 2015-05-01 NOTE — Telephone Encounter (Signed)
CALLED AND ADVISED PATIENT OF INSTRUCTIONS, RX SENT

## 2015-05-08 ENCOUNTER — Ambulatory Visit (INDEPENDENT_AMBULATORY_CARE_PROVIDER_SITE_OTHER): Payer: BLUE CROSS/BLUE SHIELD | Admitting: *Deleted

## 2015-05-08 DIAGNOSIS — J4550 Severe persistent asthma, uncomplicated: Secondary | ICD-10-CM

## 2015-06-02 ENCOUNTER — Ambulatory Visit (INDEPENDENT_AMBULATORY_CARE_PROVIDER_SITE_OTHER): Payer: BLUE CROSS/BLUE SHIELD | Admitting: Allergy and Immunology

## 2015-06-02 ENCOUNTER — Encounter: Payer: Self-pay | Admitting: Allergy and Immunology

## 2015-06-02 ENCOUNTER — Ambulatory Visit: Payer: BLUE CROSS/BLUE SHIELD | Admitting: Allergy and Immunology

## 2015-06-02 VITALS — BP 130/90 | HR 72 | Resp 20

## 2015-06-02 DIAGNOSIS — H1010 Acute atopic conjunctivitis, unspecified eye: Secondary | ICD-10-CM | POA: Diagnosis not present

## 2015-06-02 DIAGNOSIS — J387 Other diseases of larynx: Secondary | ICD-10-CM

## 2015-06-02 DIAGNOSIS — J4550 Severe persistent asthma, uncomplicated: Secondary | ICD-10-CM | POA: Diagnosis not present

## 2015-06-02 DIAGNOSIS — J4551 Severe persistent asthma with (acute) exacerbation: Secondary | ICD-10-CM

## 2015-06-02 DIAGNOSIS — J309 Allergic rhinitis, unspecified: Secondary | ICD-10-CM

## 2015-06-02 DIAGNOSIS — Z92241 Personal history of systemic steroid therapy: Secondary | ICD-10-CM

## 2015-06-02 DIAGNOSIS — K219 Gastro-esophageal reflux disease without esophagitis: Secondary | ICD-10-CM

## 2015-06-02 MED ORDER — MOMETASONE FUROATE 50 MCG/ACT NA SUSP: NASAL | Status: DC

## 2015-06-02 NOTE — Patient Instructions (Signed)
  1. Continue Dulera 200 two puffs two times per day  2. Increase Qvar 80 two puffs two times per day  3. Continue pantoprazole and ranitidine  4. Continue Nucala and Epi-pen  5. Start nasonex one spray each nostril 1-2 times per day.   6. Continue Proair HFA or Albuterol nebulization if needed.  7. Can add OTC antihistamine - Zyrtec / Allegra / Claritin  8. Return to clinic in 8 weeks or earlier if problem

## 2015-06-02 NOTE — Progress Notes (Signed)
Follow-up Note  Referring Provider: [name], MD Primary Provider: Gilford Rile, MD Date of Office Visit: 06/02/2015  Subjective:   Beth Rodriguez (DOB: Aug 11, 1960) is a 55 y.o. female who returns to the [facility] [place] on 06/02/2015 in re-evaluation of the following:  HPI Comments: Beth Rodriguez returns to this clinic in reevaluation of her severe asthma treated with nucala, and reflux-induced respiratory disease. She had been doing quite well since her last visit but unfortunately over the course of the past week has developed problems with stuffy nose and sneezing. She started some Claritin yesterday. She's had some dyspnea on exertion but she does well at rest. She has been using her bronchodilator twice a day for wheezing and coughing. She continues on a combination of Dulera and Qvar and has been consistently using her nucala. Her reflux is been under excellent control while using her proton pump inhibitor and Ranitidine. She is not had any associated fever or ugly sputum production or ugly nasal discharge or anosmia with this episode.   Current Outpatient Prescriptions on File Prior to Visit  Medication Sig Dispense Refill  . albuterol (PROAIR HFA) 108 (90 BASE) MCG/ACT inhaler Inhale 2 puffs into the lungs as needed for wheezing or shortness of breath.     Marland Kitchen albuterol (PROVENTIL) (2.5 MG/3ML) 0.083% nebulizer solution Take 2.5 mg by nebulization every 6 (six) hours as needed for shortness of breath.     . beclomethasone (QVAR) 80 MCG/ACT inhaler Inhale 2 puffs into the lungs 2 (two) times daily. 1 Inhaler 5  . levothyroxine (SYNTHROID, LEVOTHROID) 88 MCG tablet Take 88 mcg by mouth daily.  5  . loratadine (CLARITIN) 10 MG tablet Take 10 mg by mouth daily as needed for allergies. Reported on 04/03/2015    . losartan (COZAAR) 100 MG tablet Take 100 mg by mouth daily.  1  . Mometasone Furo-Formoterol Fum (DULERA) 200-5 MCG/ACT AERO Inhale 2 puffs into the lungs 2 (two) times  daily.      . Multiple Vitamins-Minerals (MULTIVITAMIN PO) Take 1 tablet by mouth daily.    Marland Kitchen NUCALA 100 MG SOLR     . pantoprazole (PROTONIX) 40 MG tablet Take 40 mg by mouth 2 (two) times daily.      Marland Kitchen oxyCODONE-acetaminophen (PERCOCET/ROXICET) 5-325 MG per tablet Take 1-2 tablets by mouth every 6 (six) hours as needed for severe pain (pain). Reported on 06/02/2015    . ranitidine (ZANTAC) 300 MG tablet Take 300 mg by mouth at bedtime. Reported on 06/02/2015     Current Facility-Administered Medications on File Prior to Visit  Medication Dose Route Frequency Provider Last Rate Last Dose  . Mepolizumab SOLR 100 mg  100 mg Subcutaneous Q28 days Jiles Prows, MD   100 mg at 06/02/15 1112    Meds ordered this encounter  Medications  . mometasone (NASONEX) 50 MCG/ACT nasal spray    Sig: Use one spray in each nostril once or twice daily    Dispense:  17 g    Refill:  5    Dispense generic if covered    Past Medical History  Diagnosis Date  . Asthma   . Hypertension   . Embolism - blood clot 2007    right leg hx of  . Thyroid disease     hypothyroidism  . Enlarged heart   . Carpal tunnel syndrome on right   . Sleep apnea     last sleep study over five years  . Shortness of  breath dyspnea     with exertion  . Hypothyroidism   . GERD (gastroesophageal reflux disease)   . Arthritis     Past Surgical History  Procedure Laterality Date  . Knee surgery  2007    right knee  . Sinus surgery with instatrak      ???  . Carpal tunnel release Right   . Eye surgery Bilateral     cataract removal   . Shoulder arthroscopy with open rotator cuff repair and distal clavicle acrominectomy Right 08/09/2014    Procedure: SHOULDER ARTHROSCOPY WITH OPEN ROTATOR CUFF REPAIR AND DISTAL CLAVICLE ACROMINECTOMY;  Surgeon: Earlie Server, MD;  Location: [facility];  Service: Orthopedics;  Laterality: Right;    Allergies  Allergen Reactions  . Penicillins Hives  . Theophylline     REACTION: headaches      Review of systems negative except as noted in HPI / PMHx or noted below:  Review of Systems  Constitutional: Negative.   HENT: Negative.   Eyes: Negative.   Respiratory: Negative.   Cardiovascular: Negative.   Gastrointestinal: Negative.   Genitourinary: Negative.   Musculoskeletal: Negative.   Skin: Negative.   Neurological: Negative.   Endo/Heme/Allergies: Negative.   Psychiatric/Behavioral: Negative.      Objective:   Filed Vitals:   06/02/15 1059  BP: 130/90  Pulse: 72  Resp: 20          Physical Exam  Constitutional: She is well-developed, well-nourished, and in no distress.  HENT:  Head: Normocephalic.  Right Ear: Tympanic membrane, external ear and ear canal normal.  Left Ear: Tympanic membrane, external ear and ear canal normal.  Nose: Mucosal edema present. No rhinorrhea.  Mouth/Throat: Uvula is midline, oropharynx is clear and moist and mucous membranes are normal. No oropharyngeal exudate.  Eyes: Conjunctivae are normal.  Neck: Trachea normal. No tracheal tenderness present. No tracheal deviation present. No thyromegaly present.  Cardiovascular: Normal rate, regular rhythm, S1 normal, S2 normal and normal heart sounds.   No murmur heard. Pulmonary/Chest: Breath sounds normal. No stridor. No respiratory distress. She has no wheezes. She has no rales.  Musculoskeletal: She exhibits no edema.  Lymphadenopathy:       Head (right side): No tonsillar adenopathy present.       Head (left side): No tonsillar adenopathy present.    She has no cervical adenopathy.    She has no axillary adenopathy.  Neurological: She is alert. Gait normal.  Skin: No rash noted. She is not diaphoretic. No erythema. Nails show no clubbing.  Psychiatric: Mood and affect normal.    Diagnostics:    Spirometry was performed and demonstrated an FEV1 of 1.54 at 63 % of predicted.  The patient had an Asthma Control Test with the following results: ACT Total Score: 17.     Assessment and Plan:   1. Severe persistent asthma, with acute exacerbation   2. History of systemic steroid therapy   3. LPRD (laryngopharyngeal reflux disease)   4. Allergic rhinoconjunctivitis   5. Severe persistent asthma, uncomplicated     1. Continue Dulera 200 two puffs two times per day  2. Increase Qvar 80 two puffs two times per day  3. Continue pantoprazole and ranitidine  4. Continue Nucala and Epi-pen  5. Start nasonex one spray each nostril 1-2 times per day.   6. Continue Proair HFA or Albuterol nebulization if needed.  7. Can add OTC antihistamine - Zyrtec / Allegra / Claritin  8. Return to clinic in 8 weeks  or earlier if problem  Beth Rodriguez has developed some problems with rhinitis which I will assume is secondary to pollen exposure and treat her as noted above for his atopic condition. We'll have her increase her dose of Qvar and continue to use her Dulera and nucala and hold off on any systemic steroids at this point in time. She'll keep in contact with me noting her response to this approach and if she does well I will see her back in this clinic in 8 weeks or earlier if there is a problem.  Allena Katz, MD [place]

## 2015-06-23 ENCOUNTER — Ambulatory Visit (INDEPENDENT_AMBULATORY_CARE_PROVIDER_SITE_OTHER): Payer: BLUE CROSS/BLUE SHIELD | Admitting: Allergy and Immunology

## 2015-06-23 ENCOUNTER — Encounter: Payer: Self-pay | Admitting: Allergy and Immunology

## 2015-06-23 VITALS — BP 112/84 | HR 84 | Resp 20

## 2015-06-23 DIAGNOSIS — J309 Allergic rhinitis, unspecified: Secondary | ICD-10-CM

## 2015-06-23 DIAGNOSIS — H1010 Acute atopic conjunctivitis, unspecified eye: Secondary | ICD-10-CM

## 2015-06-23 DIAGNOSIS — J019 Acute sinusitis, unspecified: Secondary | ICD-10-CM

## 2015-06-23 DIAGNOSIS — J4550 Severe persistent asthma, uncomplicated: Secondary | ICD-10-CM | POA: Diagnosis not present

## 2015-06-23 DIAGNOSIS — J387 Other diseases of larynx: Secondary | ICD-10-CM | POA: Diagnosis not present

## 2015-06-23 DIAGNOSIS — K219 Gastro-esophageal reflux disease without esophagitis: Secondary | ICD-10-CM

## 2015-06-23 MED ORDER — CEFDINIR 300 MG PO CAPS: ORAL_CAPSULE | ORAL | Status: DC

## 2015-06-23 NOTE — Patient Instructions (Signed)
  1. Continue Dulera 200 two puffs two times per day  2. Continue Qvar 80 two puffs two times per day  3. Continue pantoprazole and ranitidine  4. Continue Nucala and Epi-pen  5. Continue nasonex one spray each nostril 1-2 times per day.   6. Continue Proair HFA or Albuterol nebulization if needed.  7. Can add OTC antihistamine - Zyrtec / Allegra / Claritin, and nasal saline  8. Omnicef 300 mg 2 capsules once a day for 10 days  8. Return to clinic in 8 weeks or earlier if problem

## 2015-06-23 NOTE — Progress Notes (Signed)
Follow-up Note  Referring Provider: [name], MD Primary Provider: Gilford Rile, MD Date of Office Visit: 06/23/2015  Subjective:   Beth Rodriguez (DOB: May 25, 1960) is a 55 y.o. female who returns to the [facility] and [place] on 06/23/2015 in re-evaluation of the following:  HPI Comments: Niyanna presents to this clinic in reevaluation of her severe persistent asthma treated with nucala, allergic rhinoconjunctivitis, and reflux-induced respiratory disease. She was doing relatively well on a large collection of medical therapy but unfortunately over the course of the past 5 days or so she's had problems with nasal congestion and now is getting a bad smell from her nose and has developed problems with wheezing and coughing over the course of the past several days. She is not had to use her short-acting nebulizer solution but she is using a short acting MDI at least daily while continuing to use a combination of Dulera and Qvar. She continues on Nasonex and pantoprazole and ranitidine.   Outpatient Prescriptions Prior to Visit  Medication Sig Dispense Refill  . albuterol (PROAIR HFA) 108 (90 BASE) MCG/ACT inhaler Inhale 2 puffs into the lungs as needed for wheezing or shortness of breath.     Marland Kitchen albuterol (PROVENTIL) (2.5 MG/3ML) 0.083% nebulizer solution Take 2.5 mg by nebulization every 6 (six) hours as needed for shortness of breath.     . beclomethasone (QVAR) 80 MCG/ACT inhaler Inhale 2 puffs into the lungs 2 (two) times daily. 1 Inhaler 5  . levothyroxine (SYNTHROID, LEVOTHROID) 88 MCG tablet Take 88 mcg by mouth daily.  5  . loratadine (CLARITIN) 10 MG tablet Take 10 mg by mouth daily as needed for allergies. Reported on 04/03/2015    . losartan (COZAAR) 100 MG tablet Take 100 mg by mouth daily.  1  . mometasone (NASONEX) 50 MCG/ACT nasal spray Use one spray in each nostril once or twice daily 17 g 5  . Mometasone Furo-Formoterol Fum (DULERA) 200-5 MCG/ACT AERO Inhale 2 puffs  into the lungs 2 (two) times daily.      . Multiple Vitamins-Minerals (MULTIVITAMIN PO) Take 1 tablet by mouth daily.    Marland Kitchen NUCALA 100 MG SOLR     . oxyCODONE-acetaminophen (PERCOCET/ROXICET) 5-325 MG per tablet Take 1-2 tablets by mouth every 6 (six) hours as needed for severe pain (pain). Reported on 06/02/2015    . pantoprazole (PROTONIX) 40 MG tablet Take 40 mg by mouth 2 (two) times daily.      . ranitidine (ZANTAC) 300 MG tablet Take 300 mg by mouth at bedtime. Reported on 06/23/2015     Facility-Administered Medications Prior to Visit  Medication Dose Route Frequency Provider Last Rate Last Dose  . Mepolizumab SOLR 100 mg  100 mg Subcutaneous Q28 days Jiles Prows, MD   100 mg at 06/02/15 1112    Past Medical History  Diagnosis Date  . Asthma   . Hypertension   . Embolism - blood clot 2007    right leg hx of  . Thyroid disease     hypothyroidism  . Enlarged heart   . Carpal tunnel syndrome on right   . Sleep apnea     last sleep study over five years  . Shortness of breath dyspnea     with exertion  . Hypothyroidism   . GERD (gastroesophageal reflux disease)   . Arthritis     Past Surgical History  Procedure Laterality Date  . Knee surgery  2007    right knee  .  Sinus surgery with instatrak      ???  . Carpal tunnel release Right   . Eye surgery Bilateral     cataract removal   . Shoulder arthroscopy with open rotator cuff repair and distal clavicle acrominectomy Right 08/09/2014    Procedure: SHOULDER ARTHROSCOPY WITH OPEN ROTATOR CUFF REPAIR AND DISTAL CLAVICLE ACROMINECTOMY;  Surgeon: Earlie Server, MD;  Location: [place];  Service: Orthopedics;  Laterality: Right;    Allergies  Allergen Reactions  . Penicillins Hives  . Theophylline     REACTION: headaches    Review of systems negative except as noted in HPI / PMHx or noted below:  Review of Systems  Constitutional: Negative.   HENT: Negative.   Eyes: Negative.   Respiratory: Negative.     Cardiovascular: Negative.   Gastrointestinal: Negative.   Genitourinary: Negative.   Musculoskeletal: Negative.   Skin: Negative.   Neurological: Negative.   Endo/Heme/Allergies: Negative.   Psychiatric/Behavioral: Negative.      Objective:   Filed Vitals:   06/23/15 1105  BP: 112/84  Pulse: 84  Resp: 20          Physical Exam  Constitutional: She is well-developed, well-nourished, and in no distress.  HENT:  Head: Normocephalic.  Right Ear: Tympanic membrane, external ear and ear canal normal.  Left Ear: Tympanic membrane, external ear and ear canal normal.  Nose: Mucosal edema (Erythematous) present. No rhinorrhea.  Mouth/Throat: Uvula is midline, oropharynx is clear and moist and mucous membranes are normal. No oropharyngeal exudate.  Eyes: Conjunctivae are normal.  Neck: Trachea normal. No tracheal tenderness present. No tracheal deviation present. No thyromegaly present.  Cardiovascular: Normal rate, regular rhythm, S1 normal, S2 normal and normal heart sounds.   No murmur heard. Pulmonary/Chest: No stridor. No respiratory distress. She has wheezes (Slight end expiratory wheezing heard in all lung fields). She has no rales.  Musculoskeletal: She exhibits no edema.  Lymphadenopathy:       Head (right side): No tonsillar adenopathy present.       Head (left side): No tonsillar adenopathy present.    She has no cervical adenopathy.    She has no axillary adenopathy.  Neurological: She is alert. Gait normal.  Skin: No rash noted. She is not diaphoretic. No erythema. Nails show no clubbing.  Psychiatric: Mood and affect normal.    Diagnostics:    Spirometry was performed and demonstrated an FEV1 of 1.50 at 62 % of predicted.  The patient had an Asthma Control Test with the following results:  .    Assessment and Plan:   1. Severe persistent asthma, uncomplicated   2. Allergic rhinoconjunctivitis   3. LPRD (laryngopharyngeal reflux disease)   4. Acute  sinusitis, recurrence not specified, unspecified location     1. Continue Dulera 200 two puffs two times per day  2. Continue Qvar 80 two puffs two times per day  3. Continue pantoprazole and ranitidine  4. Continue Nucala and Epi-pen  5. Continue nasonex one spray each nostril 1-2 times per day.   6. Continue Proair HFA or Albuterol nebulization if needed.  7. Can add OTC antihistamine - Zyrtec / Allegra / Claritin, and nasal saline  8. Omnicef 300 mg 2 capsules once a day for 10 days  8. Return to clinic in 8 weeks or earlier if problem  I will assume that Nilza does have a upper respiratory tract infection giving rise to her asthma flare and we'll treat her with an antibiotic as mentioned above  while she continues to use a biological agent and her high-dose inhaled steroids and also continues to address the issue with reflux. If she does well I will see her back in this clinic in 8 weeks or earlier if there is a problem. Certainly she will contact me should there be significant problem.  Allena Katz, MD [facility]

## 2015-06-30 ENCOUNTER — Ambulatory Visit (INDEPENDENT_AMBULATORY_CARE_PROVIDER_SITE_OTHER): Payer: BLUE CROSS/BLUE SHIELD | Admitting: *Deleted

## 2015-06-30 DIAGNOSIS — J4550 Severe persistent asthma, uncomplicated: Secondary | ICD-10-CM

## 2015-07-09 ENCOUNTER — Other Ambulatory Visit: Payer: Self-pay | Admitting: Allergy and Immunology

## 2015-07-10 ENCOUNTER — Other Ambulatory Visit: Payer: Self-pay | Admitting: *Deleted

## 2015-07-10 MED ORDER — BECLOMETHASONE DIPROPIONATE 80 MCG/ACT IN AERS: INHALATION_SPRAY | RESPIRATORY_TRACT | Status: DC

## 2015-07-18 DIAGNOSIS — Z79899 Other long term (current) drug therapy: Secondary | ICD-10-CM | POA: Insufficient documentation

## 2015-07-18 DIAGNOSIS — E782 Mixed hyperlipidemia: Secondary | ICD-10-CM | POA: Insufficient documentation

## 2015-07-18 DIAGNOSIS — E039 Hypothyroidism, unspecified: Secondary | ICD-10-CM | POA: Insufficient documentation

## 2015-07-18 DIAGNOSIS — M51369 Other intervertebral disc degeneration, lumbar region without mention of lumbar back pain or lower extremity pain: Secondary | ICD-10-CM | POA: Insufficient documentation

## 2015-07-18 DIAGNOSIS — M5136 Other intervertebral disc degeneration, lumbar region: Secondary | ICD-10-CM

## 2015-07-18 DIAGNOSIS — K219 Gastro-esophageal reflux disease without esophagitis: Secondary | ICD-10-CM

## 2015-07-18 HISTORY — DX: Other intervertebral disc degeneration, lumbar region: M51.36

## 2015-07-18 HISTORY — DX: Other intervertebral disc degeneration, lumbar region without mention of lumbar back pain or lower extremity pain: M51.369

## 2015-07-18 HISTORY — DX: Hypothyroidism, unspecified: E03.9

## 2015-07-18 HISTORY — DX: Mixed hyperlipidemia: E78.2

## 2015-07-18 HISTORY — DX: Gastro-esophageal reflux disease without esophagitis: K21.9

## 2015-07-21 ENCOUNTER — Ambulatory Visit (INDEPENDENT_AMBULATORY_CARE_PROVIDER_SITE_OTHER): Payer: BLUE CROSS/BLUE SHIELD | Admitting: Allergy and Immunology

## 2015-07-21 ENCOUNTER — Encounter: Payer: Self-pay | Admitting: Allergy and Immunology

## 2015-07-21 VITALS — BP 130/70 | HR 80 | Resp 18

## 2015-07-21 DIAGNOSIS — J309 Allergic rhinitis, unspecified: Secondary | ICD-10-CM

## 2015-07-21 DIAGNOSIS — J387 Other diseases of larynx: Secondary | ICD-10-CM

## 2015-07-21 DIAGNOSIS — K219 Gastro-esophageal reflux disease without esophagitis: Secondary | ICD-10-CM

## 2015-07-21 DIAGNOSIS — J4551 Severe persistent asthma with (acute) exacerbation: Secondary | ICD-10-CM

## 2015-07-21 DIAGNOSIS — H1010 Acute atopic conjunctivitis, unspecified eye: Secondary | ICD-10-CM

## 2015-07-21 MED ORDER — METHYLPREDNISOLONE ACETATE 80 MG/ML IJ SUSP
80.0000 mg | Freq: Once | INTRAMUSCULAR | Status: AC
  Administered 2015-07-21: 80 mg via INTRAMUSCULAR

## 2015-07-21 NOTE — Progress Notes (Signed)
Follow-up Note  Referring Provider: Raina Rodriguez., MD Primary Provider: Gilford Rile, MD Date of Office Visit: 07/21/2015  Subjective:   Beth Rodriguez (DOB: 1960-08-08) is a 55 y.o. female who returns to the [facility] [place] on 07/21/2015 in re-evaluation of the following:  HPI Comments: Beth Rodriguez presents to this clinic noting that she's continued to have problems with shortness of breath and coughing even in the face of utilizing a large collection of medical therapy and utilizing a course of antibiotics approximately 3 weeks ago. She has not had any high fever or ugly nasal discharge or ugly sputum production. She does use her short-acting bronchodilator about 3 times a day. She is able to sleep through the night.     Medication List           AZO CRANBERRY PO  Take 2 tablets by mouth daily.     beclomethasone 80 MCG/ACT inhaler  Commonly known as:  QVAR  INHALE TWO PUFFS TWICE DAILY TO PREVENT COUGH OR WHEEZE. RINSE MOUTH AFTER USE.     DULERA 200-5 MCG/ACT Aero  Generic drug:  mometasone-formoterol  Inhale 2 puffs into the lungs 2 (two) times daily.     EPIPEN 2-PAK 0.3 mg/0.3 mL Soaj injection  Generic drug:  EPINEPHrine  Use as directed for life-threatening allergic reaction.     levothyroxine 88 MCG tablet  Commonly known as:  SYNTHROID, LEVOTHROID  Take 88 mcg by mouth daily.     loratadine 10 MG tablet  Commonly known as:  CLARITIN  Take 10 mg by mouth daily as needed for allergies. Reported on 07/21/2015     losartan 100 MG tablet  Commonly known as:  COZAAR  Take 100 mg by mouth daily.     mometasone 50 MCG/ACT nasal spray  Commonly known as:  NASONEX  Use one spray in each nostril once or twice daily     MULTIVITAMIN PO  Take 1 tablet by mouth daily.     NUCALA 100 MG Solr  Generic drug:  Mepolizumab     oxyCODONE-acetaminophen 5-325 MG tablet  Commonly known as:  PERCOCET/ROXICET  Take 1-2 tablets by mouth every 6 (six) hours as  needed for severe pain (pain). Reported on 06/02/2015     pantoprazole 40 MG tablet  Commonly known as:  PROTONIX  Take 40 mg by mouth 2 (two) times daily.     PROAIR HFA 108 (90 Base) MCG/ACT inhaler  Generic drug:  albuterol  INHALE 2 PUFFS EVERY 4 TO 6 HOURS AS NEEDED FOR SEVERE COUGH OR WHEEZE.     PROVENTIL (2.5 MG/3ML) 0.083% nebulizer solution  Generic drug:  albuterol  Take 2.5 mg by nebulization every 6 (six) hours as needed for shortness of breath.        Past Medical History  Diagnosis Date  . Asthma   . Hypertension   . Embolism - blood clot 2007    right leg hx of  . Thyroid disease     hypothyroidism  . Enlarged heart   . Carpal tunnel syndrome on right   . Sleep apnea     last sleep study over five years  . Shortness of breath dyspnea     with exertion  . Hypothyroidism   . GERD (gastroesophageal reflux disease)   . Arthritis     Past Surgical History  Procedure Laterality Date  . Knee surgery  2007    right knee  . Sinus surgery with instatrak      ???  .  Carpal tunnel release Right   . Eye surgery Bilateral     cataract removal   . Shoulder arthroscopy with open rotator cuff repair and distal clavicle acrominectomy Right 08/09/2014    Procedure: SHOULDER ARTHROSCOPY WITH OPEN ROTATOR CUFF REPAIR AND DISTAL CLAVICLE ACROMINECTOMY;  Surgeon: Beth Server, MD;  Location: [facility];  Service: Orthopedics;  Laterality: Right;    Allergies  Allergen Reactions  . Penicillin G Other (See Comments)    Unknown  . Penicillins Hives  . Theophylline     REACTION: headaches    Review of systems negative except as noted in HPI / PMHx or noted below:  Review of Systems  Constitutional: Negative.   HENT: Negative.   Eyes: Negative.   Respiratory: Negative.   Cardiovascular: Negative.   Gastrointestinal: Negative.   Genitourinary: Negative.   Musculoskeletal: Negative.   Skin: Negative.   Neurological: Negative.   Endo/Heme/Allergies: Negative.     Psychiatric/Behavioral: Negative.      Objective:   Filed Vitals:   07/21/15 1009  BP: 130/70  Pulse: 80  Resp: 18          Physical Exam  Constitutional: She is well-developed, well-nourished, and in no distress.  HENT:  Head: Normocephalic.  Right Ear: Tympanic membrane, external ear and ear canal normal.  Left Ear: Tympanic membrane, external ear and ear canal normal.  Nose: Nose normal. No mucosal edema or rhinorrhea.  Mouth/Throat: Uvula is midline, oropharynx is clear and moist and mucous membranes are normal. No oropharyngeal exudate.  Eyes: Conjunctivae are normal.  Neck: Trachea normal. No tracheal tenderness present. No tracheal deviation present. No thyromegaly present.  Cardiovascular: Normal rate, regular rhythm, S1 normal, S2 normal and normal heart sounds.   No murmur heard. Pulmonary/Chest: No stridor. No respiratory distress. She has wheezes (Bilateral expiratory wheezes). She has no rales.  Musculoskeletal: She exhibits no edema.  Lymphadenopathy:       Head (right side): No tonsillar adenopathy present.       Head (left side): No tonsillar adenopathy present.    She has no cervical adenopathy.  Neurological: She is alert. Gait normal.  Skin: No rash noted. She is not diaphoretic. No erythema. Nails show no clubbing.  Psychiatric: Mood and affect normal.    Diagnostics:    Spirometry was performed and demonstrated an FEV1 of 1.36 at 56 % of predicted.  The patient had an Asthma Control Test with the following results: ACT Total Score: 11.    Assessment and Plan:   1. Severe persistent asthma, with acute exacerbation   2. Allergic rhinoconjunctivitis   3. LPRD (laryngopharyngeal reflux disease)     1. Continue Dulera 200 two puffs two times per day  2. Continue Qvar 80 two puffs two times per day  3. Continue pantoprazole and ranitidine  4. Continue Nucala and Epi-pen  5. Continue nasonex one spray each nostril 1-2 times per day.   6.  Continue Proair HFA or Albuterol nebulization if needed.  7. Can add OTC antihistamine - Zyrtec / Allegra / Claritin, and nasal saline  8. Depo-Medrol 80 IM delivered in clinic  9. Return to clinic in 8 weeks or earlier if problem  I will give Beth Rodriguez a systemic steroid today to help with her respiratory tract inflammation and she will continue to utilize the therapy mentioned above and I'll regroup with her in 8 weeks or earlier if there is a problem. There does not appear to be an indication for administering an antibiotic at  this point in time.  Allena Katz, MD [place]

## 2015-07-21 NOTE — Patient Instructions (Signed)
  1. Continue Dulera 200 two puffs two times per day  2. Continue Qvar 80 two puffs two times per day  3. Continue pantoprazole and ranitidine  4. Continue Nucala and Epi-pen  5. Continue nasonex one spray each nostril 1-2 times per day.   6. Continue Proair HFA or Albuterol nebulization if needed.  7. Can add OTC antihistamine - Zyrtec / Allegra / Claritin, and nasal saline  8. Depo-Medrol 80 IM delivered in clinic  9. Return to clinic in 8 weeks or earlier if problem

## 2015-07-22 DIAGNOSIS — R7303 Prediabetes: Secondary | ICD-10-CM

## 2015-07-22 HISTORY — DX: Prediabetes: R73.03

## 2015-08-04 ENCOUNTER — Ambulatory Visit (INDEPENDENT_AMBULATORY_CARE_PROVIDER_SITE_OTHER): Payer: BLUE CROSS/BLUE SHIELD | Admitting: *Deleted

## 2015-08-04 DIAGNOSIS — J4551 Severe persistent asthma with (acute) exacerbation: Secondary | ICD-10-CM | POA: Diagnosis not present

## 2015-09-01 ENCOUNTER — Ambulatory Visit (INDEPENDENT_AMBULATORY_CARE_PROVIDER_SITE_OTHER): Payer: BLUE CROSS/BLUE SHIELD

## 2015-09-01 DIAGNOSIS — J4551 Severe persistent asthma with (acute) exacerbation: Secondary | ICD-10-CM | POA: Diagnosis not present

## 2015-09-10 ENCOUNTER — Other Ambulatory Visit: Payer: Self-pay

## 2015-09-10 MED ORDER — MOMETASONE FURO-FORMOTEROL FUM 200-5 MCG/ACT IN AERO: 2.0000 | INHALATION_SPRAY | Freq: Two times a day (BID) | RESPIRATORY_TRACT | Status: DC

## 2015-09-22 ENCOUNTER — Other Ambulatory Visit: Payer: Self-pay | Admitting: *Deleted

## 2015-09-22 MED ORDER — BECLOMETHASONE DIPROPIONATE 80 MCG/ACT IN AERS: INHALATION_SPRAY | RESPIRATORY_TRACT | Status: DC

## 2015-09-25 ENCOUNTER — Encounter: Payer: Self-pay | Admitting: Allergy and Immunology

## 2015-09-25 ENCOUNTER — Ambulatory Visit (INDEPENDENT_AMBULATORY_CARE_PROVIDER_SITE_OTHER): Payer: BLUE CROSS/BLUE SHIELD | Admitting: Allergy and Immunology

## 2015-09-25 ENCOUNTER — Ambulatory Visit (INDEPENDENT_AMBULATORY_CARE_PROVIDER_SITE_OTHER): Payer: BLUE CROSS/BLUE SHIELD

## 2015-09-25 VITALS — BP 124/84 | HR 80 | Resp 16

## 2015-09-25 DIAGNOSIS — J387 Other diseases of larynx: Secondary | ICD-10-CM

## 2015-09-25 DIAGNOSIS — J4550 Severe persistent asthma, uncomplicated: Secondary | ICD-10-CM

## 2015-09-25 DIAGNOSIS — J309 Allergic rhinitis, unspecified: Secondary | ICD-10-CM | POA: Diagnosis not present

## 2015-09-25 DIAGNOSIS — J4551 Severe persistent asthma with (acute) exacerbation: Secondary | ICD-10-CM

## 2015-09-25 DIAGNOSIS — H1010 Acute atopic conjunctivitis, unspecified eye: Secondary | ICD-10-CM

## 2015-09-25 DIAGNOSIS — Z92241 Personal history of systemic steroid therapy: Secondary | ICD-10-CM

## 2015-09-25 DIAGNOSIS — K219 Gastro-esophageal reflux disease without esophagitis: Secondary | ICD-10-CM

## 2015-09-25 MED ORDER — EPINEPHRINE 0.3 MG/0.3ML IJ SOAJ
INTRAMUSCULAR | Status: DC
Start: 2015-09-25 — End: 2016-12-03

## 2015-09-25 NOTE — Patient Instructions (Signed)
  1. Continue Dulera 200 two puffs two times per day  2. Continue Qvar 80 two puffs two times per day  3. Continue pantoprazole 40mg  twice a day  4. Continue Nucala and AUVI-Q 3.0 (replaces Epi-Pen)  5. Continue nasonex one spray each nostril 1-2 times per day.   6. Continue Proair HFA or Albuterol nebulization if needed.  7. Can add OTC antihistamine - Zyrtec / Allegra / Claritin, and nasal saline  8. Return to clinic in 12 weeks or earlier if problem

## 2015-09-25 NOTE — Progress Notes (Signed)
Follow-up Note  Referring Provider: [name], MD Primary Provider: Gilford Rile, MD Date of Office Visit: 09/25/2015  Subjective:   Beth Rodriguez (DOB: 1961-01-31) is a 55 y.o. female who returns to the [facility] [place] on 09/25/2015 in re-evaluation of the following:  HPI: Spoorthi returns to this clinic in evaluation of her severe asthma treated with mepolizumab, allergic rhinitis, history of chronic sinusitis, and reflux-induced respiratory disease. I've not seen her in his clinic since April 2017 at which time we gave her a systemic steroid for flare of her atopic condition.   During the interval she has done very well. She still continues to use a short acting bronchodilator prior to work and sometimes before bedtime. She can sleep throughout the night without any difficulty. She does get a little bit short of breath when she is at work performing physical activity but otherwise feels as though she's doing relatively well. She's had very little issues with her nose. She has had had no issues with reflux.    Medication List           AZO CRANBERRY PO  Take 2 tablets by mouth daily.     beclomethasone 80 MCG/ACT inhaler  Commonly known as:  QVAR  INHALE TWO PUFFS TWICE DAILY TO PREVENT COUGH OR WHEEZE. RINSE MOUTH AFTER USE.     EPIPEN 2-PAK 0.3 mg/0.3 mL Soaj injection  Generic drug:  EPINEPHrine  Use as directed for life-threatening allergic reaction.     levothyroxine 88 MCG tablet  Commonly known as:  SYNTHROID, LEVOTHROID  Take 88 mcg by mouth daily.     loratadine 10 MG tablet  Commonly known as:  CLARITIN  Take 10 mg by mouth daily as needed for allergies. Reported on 07/21/2015     losartan 100 MG tablet  Commonly known as:  COZAAR  Take 100 mg by mouth daily.     mometasone 50 MCG/ACT nasal spray  Commonly known as:  NASONEX  Use one spray in each nostril once or twice daily     mometasone-formoterol 200-5 MCG/ACT Aero  Commonly known as:   DULERA  Inhale 2 puffs into the lungs 2 (two) times daily.     MULTIVITAMIN PO  Take 1 tablet by mouth daily.     NUCALA 100 MG Solr  Generic drug:  Mepolizumab     oxyCODONE-acetaminophen 5-325 MG tablet  Commonly known as:  PERCOCET/ROXICET  Take 1-2 tablets by mouth every 6 (six) hours as needed for severe pain (pain). Reported on 06/02/2015     pantoprazole 40 MG tablet  Commonly known as:  PROTONIX  Take 40 mg by mouth 2 (two) times daily.     PROAIR HFA 108 (90 Base) MCG/ACT inhaler  Generic drug:  albuterol  INHALE 2 PUFFS EVERY 4 TO 6 HOURS AS NEEDED FOR SEVERE COUGH OR WHEEZE.     PROVENTIL (2.5 MG/3ML) 0.083% nebulizer solution  Generic drug:  albuterol  Take 2.5 mg by nebulization every 6 (six) hours as needed for shortness of breath.        Past Medical History  Diagnosis Date  . Asthma   . Hypertension   . Embolism - blood clot 2007    right leg hx of  . Thyroid disease     hypothyroidism  . Enlarged heart   . Carpal tunnel syndrome on right   . Sleep apnea     last sleep study over five years  . Shortness of  breath dyspnea     with exertion  . Hypothyroidism   . GERD (gastroesophageal reflux disease)   . Arthritis     Past Surgical History  Procedure Laterality Date  . Knee surgery  2007    right knee  . Sinus surgery with instatrak      ???  . Carpal tunnel release Right   . Eye surgery Bilateral     cataract removal   . Shoulder arthroscopy with open rotator cuff repair and distal clavicle acrominectomy Right 08/09/2014    Procedure: SHOULDER ARTHROSCOPY WITH OPEN ROTATOR CUFF REPAIR AND DISTAL CLAVICLE ACROMINECTOMY;  Surgeon: Earlie Server, MD;  Location: [facility];  Service: Orthopedics;  Laterality: Right;    Allergies  Allergen Reactions  . Penicillin G Other (See Comments)    Unknown  . Penicillins Hives  . Theophylline     REACTION: headaches    Review of systems negative except as noted in HPI / PMHx or noted below:  Review of  Systems  Constitutional: Negative.   HENT: Negative.   Eyes: Negative.   Respiratory: Negative.   Cardiovascular: Negative.   Gastrointestinal: Negative.   Genitourinary: Negative.   Musculoskeletal: Negative.   Skin: Negative.   Neurological: Negative.   Endo/Heme/Allergies: Negative.   Psychiatric/Behavioral: Negative.      Objective:   Filed Vitals:   09/25/15 1038  BP: 124/84  Pulse: 80  Resp: 16          Physical Exam  Constitutional: She is well-developed, well-nourished, and in no distress.  Slightly raspy voice  HENT:  Head: Normocephalic.  Right Ear: Tympanic membrane, external ear and ear canal normal.  Left Ear: Tympanic membrane, external ear and ear canal normal.  Nose: Nose normal. No mucosal edema or rhinorrhea.  Mouth/Throat: Uvula is midline, oropharynx is clear and moist and mucous membranes are normal. No oropharyngeal exudate.  Eyes: Conjunctivae are normal.  Neck: Trachea normal. No tracheal tenderness present. No tracheal deviation present. No thyromegaly present.  Cardiovascular: Normal rate, regular rhythm, S1 normal, S2 normal and normal heart sounds.   No murmur heard. Pulmonary/Chest: Breath sounds normal. No stridor. No respiratory distress. She has no wheezes. She has no rales.  Musculoskeletal: She exhibits no edema.  Lymphadenopathy:       Head (right side): No tonsillar adenopathy present.       Head (left side): No tonsillar adenopathy present.    She has no cervical adenopathy.  Neurological: She is alert. Gait normal.  Skin: No rash noted. She is not diaphoretic. No erythema. Nails show no clubbing.  Psychiatric: Mood and affect normal.    Diagnostics:    Spirometry was performed and demonstrated an FEV1 of 1.60 at 66 % of predicted.  The patient had an Asthma Control Test with the following results: ACT Total Score: 19.    Assessment and Plan:   1. Severe persistent asthma with acute exacerbation   2. Allergic  rhinoconjunctivitis   3. LPRD (laryngopharyngeal reflux disease)   4. History of systemic steroid therapy     1. Continue Dulera 200 two puffs two times per day  2. Continue Qvar 80 two puffs two times per day  3. Continue pantoprazole 40mg  twice a day  4. Continue Nucala and AUVI-Q 3.0 (replaces Epi-Pen)  5. Continue nasonex one spray each nostril 1-2 times per day.   6. Continue Proair HFA or Albuterol nebulization if needed.  7. Can add OTC antihistamine - Zyrtec / Allegra / Claritin, and nasal  saline  8. Return to clinic in 12 weeks or earlier if problem  Shrinika is stable while continuing to use mepolizumab and high-dose inhaled steroids along with a long-acting bronchodilator and nasal steroids. As well, her reflux and her throat issue appears to be under good control with pantoprazole. We'll continue to have her use this therapy and I'll see her back in this clinic in approximately 12 weeks or earlier if there is a problem.  Allena Katz, MD [place]

## 2015-10-13 ENCOUNTER — Other Ambulatory Visit: Payer: Self-pay | Admitting: Allergy and Immunology

## 2015-10-23 ENCOUNTER — Ambulatory Visit (INDEPENDENT_AMBULATORY_CARE_PROVIDER_SITE_OTHER): Payer: BLUE CROSS/BLUE SHIELD | Admitting: *Deleted

## 2015-10-23 DIAGNOSIS — J4550 Severe persistent asthma, uncomplicated: Secondary | ICD-10-CM | POA: Diagnosis not present

## 2015-10-30 ENCOUNTER — Telehealth: Payer: Self-pay | Admitting: *Deleted

## 2015-10-30 NOTE — Telephone Encounter (Signed)
Beth Rodriguez had a Depo 80 injection 3 months ago, okay for her to have another one today?

## 2015-10-30 NOTE — Telephone Encounter (Signed)
Left message for patient to call office.  

## 2015-10-30 NOTE — Telephone Encounter (Signed)
PATIENT ADVISED WHEEZING REALLY BAD, SOME COUGH FOR COUPLE WEEKS USING ALL MEDS AS DIRECTED.  NOT ON ANY PREDNISONE AT THIS TIME

## 2015-10-30 NOTE — Telephone Encounter (Signed)
Yes

## 2015-10-30 NOTE — Telephone Encounter (Signed)
Please have patient stop by for a Depo-Medrol 80 IM injection in clinic

## 2015-10-31 NOTE — Telephone Encounter (Signed)
PATIENT CAME BUY RECD 80MG  DEPOMEDROL LOT [id number] EXP 01/18 R DELTOID

## 2015-10-31 NOTE — Telephone Encounter (Signed)
PT ADVISED WILL COME BY TODAY FOR INJ

## 2015-11-05 ENCOUNTER — Telehealth: Payer: Self-pay | Admitting: *Deleted

## 2015-11-05 NOTE — Telephone Encounter (Signed)
Please provide patient with prednisone 20 mg a day for 10 days only

## 2015-11-05 NOTE — Telephone Encounter (Signed)
Patient advises that Depomedrol she received Friday helped for a day or two then symptoms returned getting worse. Having shortness of breath and wheezing.  Do you want her to come in or call something else out

## 2015-11-05 NOTE — Telephone Encounter (Signed)
Left message to call the office.

## 2015-11-06 ENCOUNTER — Other Ambulatory Visit: Payer: Self-pay

## 2015-11-06 MED ORDER — PREDNISONE 10 MG PO TABS: ORAL_TABLET | ORAL | 0 refills | Status: DC

## 2015-11-06 NOTE — Telephone Encounter (Signed)
Rx sent to [facility]

## 2015-11-06 NOTE — Telephone Encounter (Signed)
Pt informed. [facility] in [place].

## 2015-11-24 ENCOUNTER — Ambulatory Visit (INDEPENDENT_AMBULATORY_CARE_PROVIDER_SITE_OTHER): Payer: BLUE CROSS/BLUE SHIELD | Admitting: *Deleted

## 2015-11-24 DIAGNOSIS — J4550 Severe persistent asthma, uncomplicated: Secondary | ICD-10-CM | POA: Diagnosis not present

## 2015-12-05 ENCOUNTER — Other Ambulatory Visit: Payer: Self-pay | Admitting: Allergy and Immunology

## 2015-12-22 ENCOUNTER — Ambulatory Visit (INDEPENDENT_AMBULATORY_CARE_PROVIDER_SITE_OTHER): Payer: BLUE CROSS/BLUE SHIELD | Admitting: *Deleted

## 2015-12-22 ENCOUNTER — Ambulatory Visit: Payer: BLUE CROSS/BLUE SHIELD | Admitting: Allergy and Immunology

## 2015-12-22 DIAGNOSIS — J4550 Severe persistent asthma, uncomplicated: Secondary | ICD-10-CM | POA: Diagnosis not present

## 2016-01-19 ENCOUNTER — Ambulatory Visit (INDEPENDENT_AMBULATORY_CARE_PROVIDER_SITE_OTHER): Payer: BLUE CROSS/BLUE SHIELD | Admitting: *Deleted

## 2016-01-19 DIAGNOSIS — J4550 Severe persistent asthma, uncomplicated: Secondary | ICD-10-CM | POA: Diagnosis not present

## 2016-02-16 ENCOUNTER — Ambulatory Visit (INDEPENDENT_AMBULATORY_CARE_PROVIDER_SITE_OTHER): Payer: BLUE CROSS/BLUE SHIELD | Admitting: *Deleted

## 2016-02-16 DIAGNOSIS — J4550 Severe persistent asthma, uncomplicated: Secondary | ICD-10-CM | POA: Diagnosis not present

## 2016-02-20 ENCOUNTER — Other Ambulatory Visit: Payer: Self-pay | Admitting: Allergy and Immunology

## 2016-03-17 ENCOUNTER — Ambulatory Visit (INDEPENDENT_AMBULATORY_CARE_PROVIDER_SITE_OTHER): Payer: BLUE CROSS/BLUE SHIELD | Admitting: *Deleted

## 2016-03-17 DIAGNOSIS — J4550 Severe persistent asthma, uncomplicated: Secondary | ICD-10-CM | POA: Diagnosis not present

## 2016-04-19 ENCOUNTER — Ambulatory Visit (INDEPENDENT_AMBULATORY_CARE_PROVIDER_SITE_OTHER): Payer: BLUE CROSS/BLUE SHIELD | Admitting: Allergy and Immunology

## 2016-04-19 ENCOUNTER — Encounter: Payer: Self-pay | Admitting: Allergy and Immunology

## 2016-04-19 ENCOUNTER — Other Ambulatory Visit: Payer: Self-pay | Admitting: *Deleted

## 2016-04-19 VITALS — BP 142/90 | HR 67 | Resp 14

## 2016-04-19 DIAGNOSIS — J3089 Other allergic rhinitis: Secondary | ICD-10-CM

## 2016-04-19 DIAGNOSIS — J4550 Severe persistent asthma, uncomplicated: Secondary | ICD-10-CM

## 2016-04-19 DIAGNOSIS — K219 Gastro-esophageal reflux disease without esophagitis: Secondary | ICD-10-CM

## 2016-04-19 MED ORDER — BUDESONIDE-FORMOTEROL FUMARATE 160-4.5 MCG/ACT IN AERO: 2.0000 | INHALATION_SPRAY | Freq: Two times a day (BID) | RESPIRATORY_TRACT | 3 refills | Status: DC

## 2016-04-19 MED ORDER — BENRALIZUMAB 30 MG/ML ~~LOC~~ SOSY
30.0000 mg | PREFILLED_SYRINGE | SUBCUTANEOUS | Status: DC
  Administered 2016-04-19 – 2016-06-14 (×2): 30 mg via SUBCUTANEOUS

## 2016-04-19 MED ORDER — METHYLPREDNISOLONE ACETATE 80 MG/ML IJ SUSP
80.0000 mg | Freq: Once | INTRAMUSCULAR | Status: AC
  Administered 2016-04-19: 80 mg via INTRAMUSCULAR

## 2016-04-19 MED ORDER — CEFDINIR 300 MG PO CAPS: ORAL_CAPSULE | ORAL | 0 refills | Status: DC

## 2016-04-19 MED ORDER — MOMETASONE FURO-FORMOTEROL FUM 200-5 MCG/ACT IN AERO: INHALATION_SPRAY | RESPIRATORY_TRACT | 0 refills | Status: DC

## 2016-04-19 NOTE — Patient Instructions (Addendum)
  1. Change Dulera to Symbicort 160 two puffs two times per day  2. Continue Qvar 80 two puffs two times per day  3. Continue pantoprazole 40mg  twice a day  4. Change NUCALA to Benralizumab administered today (AUVI-Q 3.0 )   5. Continue nasonex one spray each nostril 1-2 times per day.   6. Continue Proair HFA or Albuterol nebulization if needed.  7. Can add OTC antihistamine - Zyrtec / Allegra / Claritin, and nasal saline  8. Depo-Medrol 80 IM delivered in clinic today  9. Cefdinir 300 mg capsule 2 capsules once a day for 10 days  10. Return to clinic in summer 2018 or earlier if problem

## 2016-04-19 NOTE — Progress Notes (Signed)
Follow-up Note  Referring Provider: [name], MD Primary Provider: Gilford Rile, MD Date of Office Visit: 04/19/2016  Subjective:   Beth Rodriguez (DOB: 17-Feb-1961) is a 56 y.o. female who returns to the [facility] [place] [facility] on 04/19/2016 in re-evaluation of the following:  HPI: Beth Rodriguez returns to this clinic in reevaluation of her severe asthma treated with mepolizumab, allergic rhinitis, history of chronic sinusitis, and reflux-induced respiratory disease. I've not seen her in his clinic since June 2017.  Beth Rodriguez believes that she was doing relatively well although she still had issues with wheezing and coughing and using her bronchodilator few times week on her current plan which included very high-dose inhaled steroids with a combination of Dulera and Qvar. Her upper airways were doing relatively well. Her reflux is under good control.  Unfortunately, around December 23 she developed very significant issues with nasal congestion and anosmia and green nasal discharge and subsequently developed wheezing and coughing and has been using her bronchodilator daily since that point in time. She is not improving. She's not had any high fever and she's not had any chest pain. She has been having phantosmia.  Allergies as of 04/19/2016      Reactions   Penicillin G Other (See Comments)   Unknown   Penicillins Hives   Theophylline    REACTION: headaches      Medication List      AZO CRANBERRY PO Take 2 tablets by mouth daily.   beclomethasone 80 MCG/ACT inhaler Commonly known as:  QVAR INHALE TWO PUFFS TWICE DAILY TO PREVENT COUGH OR WHEEZE. RINSE MOUTH AFTER USE.   EPINEPHrine 0.3 mg/0.3 mL Soaj injection Commonly known as:  AUVI-Q Use as directed for life threatening allergic reactions   levothyroxine 88 MCG tablet Commonly known as:  SYNTHROID, LEVOTHROID Take 88 mcg by mouth daily.   loratadine 10 MG tablet Commonly known as:  CLARITIN Take 10 mg by mouth daily as  needed for allergies. Reported on 07/21/2015   losartan 100 MG tablet Commonly known as:  COZAAR Take 100 mg by mouth daily.   mometasone 50 MCG/ACT nasal spray Commonly known as:  NASONEX Use one spray in each nostril once or twice daily   mometasone-formoterol 200-5 MCG/ACT Aero Commonly known as:  DULERA Inhale two puffs twice daily as directed. Rinse, gargle, and spit after use.   pantoprazole 40 MG tablet Commonly known as:  PROTONIX Take 40 mg by mouth 2 (two) times daily.   PROAIR HFA 108 (90 Base) MCG/ACT inhaler Generic drug:  albuterol INHALE 2 PUFFS EVERY 4 TO 6 HOURS AS NEEDED FOR SEVERE COUGH OR WHEEZE.   PROVENTIL (2.5 MG/3ML) 0.083% nebulizer solution Generic drug:  albuterol Take 2.5 mg by nebulization every 6 (six) hours as needed for shortness of breath.       Past Medical History:  Diagnosis Date  . Arthritis   . Asthma   . Carpal tunnel syndrome on right   . Embolism - blood clot 2007   right leg hx of  . Enlarged heart   . GERD (gastroesophageal reflux disease)   . Hypertension   . Hypothyroidism   . Shortness of breath dyspnea    with exertion  . Sleep apnea    last sleep study over five years  . Thyroid disease    hypothyroidism    Past Surgical History:  Procedure Laterality Date  . CARPAL TUNNEL RELEASE Right   . EYE SURGERY Bilateral    cataract  removal   . KNEE SURGERY  2007   right knee  . SHOULDER ARTHROSCOPY WITH OPEN ROTATOR CUFF REPAIR AND DISTAL CLAVICLE ACROMINECTOMY Right 08/09/2014   Procedure: SHOULDER ARTHROSCOPY WITH OPEN ROTATOR CUFF REPAIR AND DISTAL CLAVICLE ACROMINECTOMY;  Surgeon: Earlie Server, MD;  Location: [facility];  Service: Orthopedics;  Laterality: Right;  . SINUS SURGERY WITH INSTATRAK     ???    Review of systems negative except as noted in HPI / PMHx or noted below:  Review of Systems  Constitutional: Negative.   HENT: Negative.   Eyes: Negative.   Respiratory: Negative.   Cardiovascular: Negative.     Gastrointestinal: Negative.   Genitourinary: Negative.   Musculoskeletal: Negative.   Skin: Negative.   Neurological: Negative.   Endo/Heme/Allergies: Negative.   Psychiatric/Behavioral: Negative.      Objective:   Vitals:   04/19/16 1528  BP: (!) 142/90  Pulse: 67  Resp: 14          Physical Exam  Constitutional: She is well-developed, well-nourished, and in no distress.  Nasal voice, slight cough, buffalo hump  HENT:  Head: Normocephalic.  Right Ear: Tympanic membrane, external ear and ear canal normal.  Left Ear: Tympanic membrane, external ear and ear canal normal.  Nose: Mucosal edema present. No rhinorrhea.  Mouth/Throat: Uvula is midline, oropharynx is clear and moist and mucous membranes are normal. No oropharyngeal exudate.  Eyes: Conjunctivae are normal.  Neck: Trachea normal. No tracheal tenderness present. No tracheal deviation present. No thyromegaly present.  Cardiovascular: Normal rate, regular rhythm, S1 normal, S2 normal and normal heart sounds.   No murmur heard. Pulmonary/Chest: No stridor. No respiratory distress. She has wheezes (expiratory wheeze). She has no rales.  Musculoskeletal: She exhibits no edema.  Lymphadenopathy:       Head (right side): No tonsillar adenopathy present.       Head (left side): No tonsillar adenopathy present.    She has no cervical adenopathy.  Neurological: She is alert. Gait normal.  Skin: No rash noted. She is not diaphoretic. No erythema. Nails show no clubbing.  Psychiatric: Mood and affect normal.    Diagnostics:    Spirometry was performed and demonstrated an FEV1 of 1.55 at 64 % of predicted.  The patient had an Asthma Control Test with the following results: ACT Total Score: 18.    Assessment and Plan:   1. Severe persistent asthma without complication   2. Other allergic rhinitis   3. LPRD (laryngopharyngeal reflux disease)     1. Change Dulera to Symbicort 160 two puffs two times per day  2.  Continue Qvar 80 two puffs two times per day  3. Continue pantoprazole 40mg  twice a day  4. Change NUCALA to Benralizumab administered today (AUVI-Q 3.0 )   5. Continue nasonex one spray each nostril 1-2 times per day.   6. Continue Proair HFA or Albuterol nebulization if needed.  7. Can add OTC antihistamine - Zyrtec / Allegra / Claritin, and nasal saline  8. Depo-Medrol 80 IM delivered in clinic today  9. Cefdinir 300 mg capsule 2 capsules once a day for 10 days  10. Return to clinic in summer 2018 or earlier if problem  It appears as though Lucella has significant inflammation of her airway most likely secondary to a infectious trigger for which she will use a combination of a systemic steroid and a antibiotic as noted above. Because she still continues to remain somewhat active with her asthma while using very high  doses of inhaled steroids along with secondary controller agents we are going to try her on a different form of a anti-IL-5 antagonist with the use of benralizumab. We'll see how things go over the course the next several months. She'll keep in contact with me noting her response.  Allena Katz, MD [place]

## 2016-04-23 ENCOUNTER — Other Ambulatory Visit: Payer: Self-pay | Admitting: Allergy and Immunology

## 2016-05-17 ENCOUNTER — Ambulatory Visit (INDEPENDENT_AMBULATORY_CARE_PROVIDER_SITE_OTHER): Payer: BLUE CROSS/BLUE SHIELD | Admitting: *Deleted

## 2016-05-17 DIAGNOSIS — J4550 Severe persistent asthma, uncomplicated: Secondary | ICD-10-CM

## 2016-06-10 ENCOUNTER — Telehealth: Payer: Self-pay | Admitting: *Deleted

## 2016-06-10 ENCOUNTER — Other Ambulatory Visit: Payer: Self-pay | Admitting: *Deleted

## 2016-06-10 MED ORDER — BUDESONIDE-FORMOTEROL FUMARATE 160-4.5 MCG/ACT IN AERO: INHALATION_SPRAY | RESPIRATORY_TRACT | 0 refills | Status: DC

## 2016-06-10 NOTE — Telephone Encounter (Signed)
A RX was sent in for Symbicort - it was a 30-day supply and Beth Rodriguez called today to request that a 90-day be sent instead - RX sent to pharmacy.

## 2016-06-14 ENCOUNTER — Ambulatory Visit (INDEPENDENT_AMBULATORY_CARE_PROVIDER_SITE_OTHER): Payer: BLUE CROSS/BLUE SHIELD | Admitting: *Deleted

## 2016-06-14 DIAGNOSIS — J4550 Severe persistent asthma, uncomplicated: Secondary | ICD-10-CM | POA: Diagnosis not present

## 2016-06-14 MED ORDER — BENRALIZUMAB 30 MG/ML ~~LOC~~ SOSY
30.0000 mg | PREFILLED_SYRINGE | SUBCUTANEOUS | Status: DC
  Administered 2016-08-09 – 2022-05-10 (×37): 30 mg via SUBCUTANEOUS

## 2016-06-16 ENCOUNTER — Other Ambulatory Visit: Payer: Self-pay | Admitting: *Deleted

## 2016-06-16 MED ORDER — FLUTICASONE PROPIONATE HFA 110 MCG/ACT IN AERO: INHALATION_SPRAY | RESPIRATORY_TRACT | 3 refills | Status: DC

## 2016-06-18 ENCOUNTER — Other Ambulatory Visit: Payer: Self-pay | Admitting: Allergy and Immunology

## 2016-07-01 ENCOUNTER — Encounter: Payer: Self-pay | Admitting: Allergy and Immunology

## 2016-07-01 ENCOUNTER — Ambulatory Visit (INDEPENDENT_AMBULATORY_CARE_PROVIDER_SITE_OTHER): Payer: BLUE CROSS/BLUE SHIELD | Admitting: Allergy and Immunology

## 2016-07-01 VITALS — BP 130/90 | HR 80 | Resp 18

## 2016-07-01 DIAGNOSIS — J3089 Other allergic rhinitis: Secondary | ICD-10-CM | POA: Diagnosis not present

## 2016-07-01 DIAGNOSIS — J309 Allergic rhinitis, unspecified: Secondary | ICD-10-CM

## 2016-07-01 DIAGNOSIS — K219 Gastro-esophageal reflux disease without esophagitis: Secondary | ICD-10-CM | POA: Diagnosis not present

## 2016-07-01 DIAGNOSIS — J4551 Severe persistent asthma with (acute) exacerbation: Secondary | ICD-10-CM

## 2016-07-01 DIAGNOSIS — H1010 Acute atopic conjunctivitis, unspecified eye: Secondary | ICD-10-CM

## 2016-07-01 MED ORDER — METHYLPREDNISOLONE ACETATE 80 MG/ML IJ SUSP
80.0000 mg | Freq: Once | INTRAMUSCULAR | Status: AC
  Administered 2016-07-01: 80 mg via INTRAMUSCULAR

## 2016-07-01 NOTE — Progress Notes (Signed)
Follow-up Note  Referring Provider: [name], MD Primary Provider: Gilford Rile, MD Date of Office Visit: 07/01/2016  Subjective:   Beth Rodriguez (DOB: [birth year]) is a 56 y.o. female who returns to the [facility] [place] on 07/01/2016 in re-evaluation of the following:  HPI: Chante returns to this clinic in reevaluation of her severe asthma, allergic rhinitis, chronic sinusitis, and reflux-induced respiratory disease. I last saw her in this clinic 04/19/2016 at which point in time we changed her from mepolizumab to benralizumab.  She was really doing quite well during the interval but unfortunately yesterday she developed sneezing and face pain and clear nasal discharge and coughing and wheezing and she just feels a little achy and has a headache. Prior to this point time she did not require systemic steroid or antibiotic to treat a respiratory tract issue and she rarely used a short-acting bronchodilator.  Her reflux is under excellent control at this point in time.  Allergies as of 07/01/2016      Reactions   Penicillin G Other (See Comments)   Unknown   Penicillins Hives   Theophylline    REACTION: headaches      Medication List      AZO CRANBERRY PO Take 2 tablets by mouth daily.   Benralizumab 30 MG/ML Sosy Commonly known as:  FASENRA Inject 30 mg into the skin every 8 (eight) weeks.   budesonide-formoterol 160-4.5 MCG/ACT inhaler Commonly known as:  SYMBICORT Inhale two puffs twice daily as directed   EPINEPHrine 0.3 mg/0.3 mL Soaj injection Commonly known as:  AUVI-Q Use as directed for life threatening allergic reactions   fluticasone 110 MCG/ACT inhaler Commonly known as:  FLOVENT HFA Inhale two puffs twice daily. Rinse mouth after use.   levothyroxine 88 MCG tablet Commonly known as:  SYNTHROID, LEVOTHROID Take 88 mcg by mouth daily.   loratadine 10 MG tablet Commonly known as:  CLARITIN Take 10 mg by mouth daily as needed for  allergies. Reported on 07/21/2015   losartan 100 MG tablet Commonly known as:  COZAAR Take 100 mg by mouth daily.   mometasone 50 MCG/ACT nasal spray Commonly known as:  NASONEX Use one spray in each nostril once or twice daily   pantoprazole 40 MG tablet Commonly known as:  PROTONIX Take 40 mg by mouth 2 (two) times daily.   PROAIR HFA 108 (90 Base) MCG/ACT inhaler Generic drug:  albuterol INHALE 2 PUFFS EVERY 4 TO 6 HOURS AS NEEDED FOR SEVERE COUGH OR WHEEZE.   PROVENTIL (2.5 MG/3ML) 0.083% nebulizer solution Generic drug:  albuterol Take 2.5 mg by nebulization every 6 (six) hours as needed for shortness of breath.   Vitamin D (Ergocalciferol) 50000 units Caps capsule Commonly known as:  DRISDOL Take 50,000 Units by mouth every 7 (seven) days.       Past Medical History:  Diagnosis Date  . Arthritis   . Asthma   . Carpal tunnel syndrome on right   . Embolism - blood clot 2007   right leg hx of  . Enlarged heart   . GERD (gastroesophageal reflux disease)   . Hypertension   . Hypothyroidism   . Shortness of breath dyspnea    with exertion  . Sleep apnea    last sleep study over five years  . Thyroid disease    hypothyroidism    Past Surgical History:  Procedure Laterality Date  . CARPAL TUNNEL RELEASE Right   . EYE SURGERY Bilateral  cataract removal   . KNEE SURGERY  2007   right knee  . SHOULDER ARTHROSCOPY WITH OPEN ROTATOR CUFF REPAIR AND DISTAL CLAVICLE ACROMINECTOMY Right 08/09/2014   Procedure: SHOULDER ARTHROSCOPY WITH OPEN ROTATOR CUFF REPAIR AND DISTAL CLAVICLE ACROMINECTOMY;  Surgeon: Earlie Server, MD;  Location: [facility];  Service: Orthopedics;  Laterality: Right;  . SINUS SURGERY WITH INSTATRAK     ???    Review of systems negative except as noted in HPI / PMHx or noted below:  Review of Systems  Constitutional: Negative.   HENT: Negative.   Eyes: Negative.   Respiratory: Negative.   Cardiovascular: Negative.   Gastrointestinal:  Negative.   Genitourinary: Negative.   Musculoskeletal: Negative.   Skin: Negative.   Neurological: Negative.   Endo/Heme/Allergies: Negative.   Psychiatric/Behavioral: Negative.      Objective:   Vitals:   07/01/16 1344  BP: 130/90  Pulse: 80  Resp: 18          Physical Exam  Constitutional: She is well-developed, well-nourished, and in no distress.  Nasal voice, cough  HENT:  Head: Normocephalic.  Right Ear: Tympanic membrane, external ear and ear canal normal.  Left Ear: Tympanic membrane, external ear and ear canal normal.  Nose: Mucosal edema (erythematous) present. No rhinorrhea.  Mouth/Throat: Uvula is midline, oropharynx is clear and moist and mucous membranes are normal. No oropharyngeal exudate.  Eyes: Conjunctivae are normal.  Neck: Trachea normal. No tracheal tenderness present. No tracheal deviation present. No thyromegaly present.  Cardiovascular: Normal rate, regular rhythm, S1 normal, S2 normal and normal heart sounds.   No murmur heard. Pulmonary/Chest: No stridor. No respiratory distress. She has wheezes (expiratory wheezes posterior lung fields). She has no rales.  Musculoskeletal: She exhibits no edema.  Lymphadenopathy:       Head (right side): No tonsillar adenopathy present.       Head (left side): No tonsillar adenopathy present.    She has no cervical adenopathy.  Neurological: She is alert. Gait normal.  Skin: No rash noted. She is not diaphoretic. No erythema. Nails show no clubbing.  Psychiatric: Mood and affect normal.    Diagnostics:    Spirometry was performed and demonstrated an FEV1 of 1.69 at 70 % of predicted.  Assessment and Plan:   1. Severe persistent asthma with acute exacerbation   2. Allergic rhinoconjunctivitis   3. Other allergic rhinitis   4. LPRD (laryngopharyngeal reflux disease)     1. Continue Symbicort 160 two puffs two times per day  2. Continue Qvar 80 two puffs two times per day  3. Continue pantoprazole  40mg  twice a day  4. Continue Benralizumab (AUVI-Q 3.0 )   5. Continue nasonex one spray each nostril 1-2 times per day.   6. Continue Proair HFA or Albuterol nebulization if needed.  7. Can add OTC antihistamine - Zyrtec / Allegra / Claritin, and nasal saline  8. Depo-Medrol 80 IM delivered in clinic today  9. Return to clinic in summer 2018 or earlier if problem  Hopefully Valeta will do well with the administration of systemic steroid to address what appears to be a viral-induced respiratory tract inflammatory flare. She will keep in close contact with me noting her response to this approach. If she does not clear up over the course of the next week or develops other significant respiratory tract symptoms we may need to consider giving her a course of antibiotics. Otherwise, if she does well she will continue on anti-inflammatory medications for her respiratory  tract and therapy directed against reflux and continue on her biological agent and I will see her back in this clinic in the summer 2018.  Allena Katz, MD Allergy / Immunology [place]

## 2016-07-01 NOTE — Patient Instructions (Addendum)
  1. Continue Symbicort 160 two puffs two times per day  2. Continue Qvar 80 two puffs two times per day  3. Continue pantoprazole 40mg  twice a day  4. Continue Benralizumab (AUVI-Q 3.0 )   5. Continue nasonex one spray each nostril 1-2 times per day.   6. Continue Proair HFA or Albuterol nebulization if needed.  7. Can add OTC antihistamine - Zyrtec / Allegra / Claritin, and nasal saline  8. Depo-Medrol 80 IM delivered in clinic today  9. Return to clinic in summer 2018 or earlier if problem

## 2016-07-05 ENCOUNTER — Telehealth: Payer: Self-pay | Admitting: *Deleted

## 2016-07-05 ENCOUNTER — Other Ambulatory Visit: Payer: Self-pay | Admitting: *Deleted

## 2016-07-05 MED ORDER — ALBUTEROL SULFATE (2.5 MG/3ML) 0.083% IN NEBU: 2.5000 mg | INHALATION_SOLUTION | Freq: Four times a day (QID) | RESPIRATORY_TRACT | 1 refills | Status: DC | PRN

## 2016-07-05 MED ORDER — HYDROCOD POLST-CPM POLST ER 10-8 MG/5ML PO SUER: 2.5000 mL | Freq: Two times a day (BID) | ORAL | 0 refills | Status: DC | PRN

## 2016-07-05 NOTE — Telephone Encounter (Signed)
Can use Tussionex 2.5-5.0 mls every 12 hours if needed for cough. Narcotic warning. 68mls only no refill

## 2016-07-05 NOTE — Telephone Encounter (Signed)
Patient advised Rx printed and signed and left up front for patient to pick up

## 2016-07-05 NOTE — Telephone Encounter (Signed)
Patient advised symptoms have gotten worse called Dr Verlin Fester over weekend and he called out Z pak but advised coughing her head off. Requesting cough rx

## 2016-07-15 ENCOUNTER — Other Ambulatory Visit: Payer: Self-pay | Admitting: *Deleted

## 2016-07-15 ENCOUNTER — Telehealth: Payer: Self-pay | Admitting: *Deleted

## 2016-07-15 MED ORDER — PREDNISONE 10 MG PO TABS: ORAL_TABLET | ORAL | 0 refills | Status: DC

## 2016-07-15 NOTE — Telephone Encounter (Signed)
Informed Beth Rodriguez, will send Prednisone to pharmacy.

## 2016-07-15 NOTE — Telephone Encounter (Signed)
Patient advised she finished antibiotic Dr Verlin Fester gave her last week (Zpak) and has develop some upper airway wheezing. Still feels she not completely better. Advised you gave her Depo when she saw you on 3/22

## 2016-07-15 NOTE — Telephone Encounter (Signed)
Please have patient use prednisone 20 mg a day for 1 week followed by 15 mg a day for 1 week followed by 10 mg a day for 1 week followed by 5 mg a day for 1 week

## 2016-08-04 ENCOUNTER — Other Ambulatory Visit: Payer: Self-pay | Admitting: Allergy and Immunology

## 2016-08-05 ENCOUNTER — Other Ambulatory Visit: Payer: Self-pay

## 2016-08-05 MED ORDER — FLUTICASONE PROPIONATE HFA 110 MCG/ACT IN AERO: INHALATION_SPRAY | RESPIRATORY_TRACT | 1 refills | Status: DC

## 2016-08-06 DIAGNOSIS — Z780 Asymptomatic menopausal state: Secondary | ICD-10-CM | POA: Insufficient documentation

## 2016-08-09 ENCOUNTER — Ambulatory Visit (INDEPENDENT_AMBULATORY_CARE_PROVIDER_SITE_OTHER): Payer: BLUE CROSS/BLUE SHIELD | Admitting: *Deleted

## 2016-08-09 DIAGNOSIS — J4550 Severe persistent asthma, uncomplicated: Secondary | ICD-10-CM | POA: Diagnosis not present

## 2016-10-07 ENCOUNTER — Ambulatory Visit (INDEPENDENT_AMBULATORY_CARE_PROVIDER_SITE_OTHER): Payer: BLUE CROSS/BLUE SHIELD | Admitting: *Deleted

## 2016-10-07 DIAGNOSIS — J4550 Severe persistent asthma, uncomplicated: Secondary | ICD-10-CM | POA: Diagnosis not present

## 2016-10-14 DIAGNOSIS — B070 Plantar wart: Secondary | ICD-10-CM | POA: Insufficient documentation

## 2016-12-02 ENCOUNTER — Ambulatory Visit (INDEPENDENT_AMBULATORY_CARE_PROVIDER_SITE_OTHER): Payer: BLUE CROSS/BLUE SHIELD | Admitting: *Deleted

## 2016-12-02 DIAGNOSIS — J4550 Severe persistent asthma, uncomplicated: Secondary | ICD-10-CM

## 2016-12-03 ENCOUNTER — Other Ambulatory Visit: Payer: Self-pay | Admitting: *Deleted

## 2016-12-03 ENCOUNTER — Telehealth: Payer: Self-pay | Admitting: Allergy and Immunology

## 2016-12-03 MED ORDER — AUVI-Q 0.3 MG/0.3ML IJ SOAJ: INTRAMUSCULAR | 3 refills | Status: DC

## 2016-12-03 NOTE — Telephone Encounter (Signed)
Beth Rodriguez needs prescription sent in for AUVI_Q the prescription had expired.

## 2016-12-03 NOTE — Telephone Encounter (Signed)
Rx sent to [facility].

## 2017-01-27 ENCOUNTER — Ambulatory Visit (INDEPENDENT_AMBULATORY_CARE_PROVIDER_SITE_OTHER): Payer: BLUE CROSS/BLUE SHIELD | Admitting: *Deleted

## 2017-01-27 DIAGNOSIS — J4550 Severe persistent asthma, uncomplicated: Secondary | ICD-10-CM

## 2017-01-28 ENCOUNTER — Encounter: Payer: Self-pay | Admitting: Neurology

## 2017-02-18 DIAGNOSIS — N281 Cyst of kidney, acquired: Secondary | ICD-10-CM | POA: Insufficient documentation

## 2017-02-18 DIAGNOSIS — I700 Atherosclerosis of aorta: Secondary | ICD-10-CM

## 2017-02-18 DIAGNOSIS — M47816 Spondylosis without myelopathy or radiculopathy, lumbar region: Secondary | ICD-10-CM

## 2017-02-18 HISTORY — DX: Atherosclerosis of aorta: I70.0

## 2017-02-18 HISTORY — DX: Spondylosis without myelopathy or radiculopathy, lumbar region: M47.816

## 2017-02-22 ENCOUNTER — Ambulatory Visit (INDEPENDENT_AMBULATORY_CARE_PROVIDER_SITE_OTHER): Payer: BLUE CROSS/BLUE SHIELD | Admitting: Neurology

## 2017-02-22 ENCOUNTER — Other Ambulatory Visit: Payer: Self-pay | Admitting: *Deleted

## 2017-02-22 DIAGNOSIS — G5602 Carpal tunnel syndrome, left upper limb: Secondary | ICD-10-CM

## 2017-02-22 DIAGNOSIS — R200 Anesthesia of skin: Secondary | ICD-10-CM

## 2017-02-22 NOTE — Procedures (Signed)
[facility]  [address], [address]  [place], [place] [address] Tel: [phone] Fax:  [phone] Test Date:  02/22/2017  Patient: Beth Rodriguez DOB: 23-Feb-1961 Physician: Narda Amber, DO  Sex: Female Height: 5\' 1"  Ref Phys: Faythe Casa, MD  ID#: [id number] Temp; 32.0C Technician:    Patient Complaints: This is a 56 year-old female referred for evaluation of left wrist pain and numbness.  NCV & EMG Findings: Extensive electrodiagnostic testing of the left upper extremity shows:  1. Left median sensory response shows prolonged latency (4.2 ms) and normal amplitude. Left ulnar sensory response is within normal limits. 2. Left median motor response shows prolonged latency (5.8 ms) normal amplitude. Of note, there is a greater proximal response of the median nerve and a motor response when stimulating at the ulnar wrist, consistent with a Martin-Gruber anastomosis. Left ulnar motor responses within normal limits. 3. Active on chronic motor axon loss changes are seen affecting the left abductor pollicis brevis muscle.  Impression: Left median neuropathy at or distal to the wrist, consistent with the clinical diagnosis of carpal tunnel syndrome. Overall, these findings are moderate in degree electrically.    ___________________________ Narda Amber, DO    Nerve Conduction Studies Anti Sensory Summary Table   Stim Site NR Peak (ms) Norm Peak (ms) P-T Amp (V) Norm P-T Amp  Left Median Anti Sensory (2nd Digit)  Wrist    4.2 <3.6 23.3 >15  Left Ulnar Anti Sensory (5th Digit)  Wrist    2.8 <3.1 37.5 >10   Motor Summary Table   Stim Site NR Onset (ms) Norm Onset (ms) O-P Amp (mV) Norm O-P Amp Site1 Site2 Delta-0 (ms) Dist (cm) Vel (m/s) Norm Vel (m/s)  Left Median Motor (Abd Poll Brev)  Wrist    5.8 <4.0 6.6 >6 Elbow Wrist 4.1 24.0 59 >50  Elbow    9.9  8.4  Ulnar-wrist crossover Elbow 3.2 0.0    Ulnar-wrist crossover    6.7  2.2         Left Ulnar Motor (Abd Dig  Minimi)  Wrist    2.1 <3.1 10.2 >7 B Elbow Wrist 3.5 21.0 60 >50  B Elbow    5.6  9.3  A Elbow B Elbow 1.9 10.0 53 >50  A Elbow    7.5  8.6          EMG   Side Muscle Ins Act Fibs Psw Fasc Number Recrt Dur Dur. Amp Amp. Poly Poly. Comment  Left 1stDorInt Nml Nml Nml Nml Nml Nml Nml Nml Nml Nml Nml Nml N/A  Left Abd Poll Brev Nml 2+ Nml Nml 1- Rapid Few 1+ Few 1+ Nml Nml N/A  Left PronatorTeres Nml Nml Nml Nml Nml Nml Nml Nml Nml Nml Nml Nml N/A  Left Biceps Nml Nml Nml Nml Nml Nml Nml Nml Nml Nml Nml Nml N/A  Left Triceps Nml Nml Nml Nml Nml Nml Nml Nml Nml Nml Nml Nml N/A  Left Deltoid Nml Nml Nml Nml Nml Nml Nml Nml Nml Nml Nml Nml N/A      Waveforms:

## 2017-03-10 ENCOUNTER — Other Ambulatory Visit: Payer: Self-pay | Admitting: *Deleted

## 2017-03-10 MED ORDER — ALBUTEROL SULFATE HFA 108 (90 BASE) MCG/ACT IN AERS: INHALATION_SPRAY | RESPIRATORY_TRACT | 0 refills | Status: DC

## 2017-03-11 DIAGNOSIS — I2089 Other forms of angina pectoris: Secondary | ICD-10-CM | POA: Insufficient documentation

## 2017-03-17 ENCOUNTER — Ambulatory Visit (INDEPENDENT_AMBULATORY_CARE_PROVIDER_SITE_OTHER): Payer: BLUE CROSS/BLUE SHIELD | Admitting: Cardiology

## 2017-03-17 ENCOUNTER — Encounter: Payer: Self-pay | Admitting: Cardiology

## 2017-03-17 VITALS — BP 150/100 | HR 88 | Ht 61.0 in | Wt 272.0 lb

## 2017-03-17 DIAGNOSIS — R0600 Dyspnea, unspecified: Secondary | ICD-10-CM

## 2017-03-17 DIAGNOSIS — I519 Heart disease, unspecified: Secondary | ICD-10-CM | POA: Diagnosis not present

## 2017-03-17 DIAGNOSIS — R0609 Other forms of dyspnea: Secondary | ICD-10-CM | POA: Diagnosis not present

## 2017-03-17 DIAGNOSIS — G4733 Obstructive sleep apnea (adult) (pediatric): Secondary | ICD-10-CM

## 2017-03-17 HISTORY — DX: Other forms of dyspnea: R06.09

## 2017-03-17 HISTORY — DX: Dyspnea, unspecified: R06.00

## 2017-03-17 NOTE — Progress Notes (Signed)
Cardiology Consultation:    Date:  03/17/2017   ID:  Beth Rodriguez, DOB 17-Jul-1960, MRN [id number]  PCP:  Beth Rodriguez., MD  Cardiologist:  Beth Campus, MD   Referring MD: Beth Rodriguez., MD   Chief Complaint  Patient presents with  . Pre-op Exam  I have calcium in my aorta  History of Present Illness:    Beth Rodriguez is a 56 y.o. female who is being seen today for the evaluation of atherosclerosis at the request of Beth Rodriguez., MD.  Patient is scheduled to have wrist surgery for carpal tunnel syndrome.  She went to her primary care physician to be evaluated for this.  Recently also she has some abdominal pain and CT of her abdomen was done.  She was fine to have calcification of the aorta.  She was referred to [place] for evaluation.  She is morbidly obese she does have hypertension, she is not sure about her cholesterol.  She does have significant sleep apnea and does not use CPAP mask.  She does work physically and have some shortness of breath when doing it.  She denies having any chest pain tightness squeezing pressure burning in the chest.  Denies having a palpitations or dizziness or passing out.  She is surprised with her blood pressure today that is so elevated.  Obviously she is very worried about potential coronary artery disease.  Past Medical History:  Diagnosis Date  . Arthritis   . Asthma   . Carpal tunnel syndrome on right   . Embolism - blood clot 2007   right leg hx of  . Enlarged heart   . GERD (gastroesophageal reflux disease)   . Hypertension   . Hypothyroidism   . Shortness of breath dyspnea    with exertion  . Sleep apnea    last sleep study over five years  . Thyroid disease    hypothyroidism    Past Surgical History:  Procedure Laterality Date  . CARPAL TUNNEL RELEASE Right   . EYE SURGERY Bilateral    cataract removal   . KNEE SURGERY  2007   right knee  . SHOULDER ARTHROSCOPY WITH OPEN ROTATOR CUFF REPAIR AND DISTAL CLAVICLE  ACROMINECTOMY Right 08/09/2014   Procedure: SHOULDER ARTHROSCOPY WITH OPEN ROTATOR CUFF REPAIR AND DISTAL CLAVICLE ACROMINECTOMY;  Surgeon: Earlie Server, MD;  Location: [facility];  Service: Orthopedics;  Laterality: Right;  . SINUS SURGERY WITH INSTATRAK     ???    Current Medications: Current Meds  Medication Sig  . albuterol (PROAIR HFA) 108 (90 Base) MCG/ACT inhaler INHALE 2 PUFFS EVERY 4 TO 6 HOURS AS NEEDED FOR SEVERE COUGH OR WHEEZE.  Marland Kitchen albuterol (PROVENTIL) (2.5 MG/3ML) 0.083% nebulizer solution Take 3 mLs (2.5 mg total) by nebulization every 6 (six) hours as needed for shortness of breath.  Marland Kitchen AUVI-Q 0.3 MG/0.3ML SOAJ injection Use as directed for life threatening allergic reactions  . Benralizumab (FASENRA) 30 MG/ML SOSY Inject 30 mg into the skin every 8 (eight) weeks.  . budesonide-formoterol (SYMBICORT) 160-4.5 MCG/ACT inhaler Inhale two puffs twice daily as directed  . Cranberry-Vitamin C-Probiotic (AZO CRANBERRY PO) Take 2 tablets by mouth daily.  . fluticasone (FLOVENT HFA) 110 MCG/ACT inhaler Inhale two puffs twice daily. Rinse mouth after use.  . levothyroxine (SYNTHROID, LEVOTHROID) 88 MCG tablet Take 88 mcg by mouth daily.  Marland Kitchen loratadine (CLARITIN) 10 MG tablet Take 10 mg by mouth daily as needed for allergies. Reported on 07/21/2015  . losartan (  COZAAR) 100 MG tablet Take 100 mg by mouth daily.  . mometasone (NASONEX) 50 MCG/ACT nasal spray Use one spray in each nostril once or twice daily  . pantoprazole (PROTONIX) 40 MG tablet Take 40 mg by mouth 2 (two) times daily.     Current Facility-Administered Medications for the 03/17/17 encounter (Office Visit) with Park Liter, MD  Medication  . Benralizumab SOSY 30 mg     Allergies:   Penicillin g; Penicillins; and Theophylline   Social History   Socioeconomic History  . Marital status: Married    Spouse name: None  . Number of children: 1  . Years of education: None  . Highest education level: None  Social Needs   . Financial resource strain: None  . Food insecurity - worry: None  . Food insecurity - inability: None  . Transportation needs - medical: None  . Transportation needs - non-medical: None  Occupational History  . Occupation: PRODUCTION ASSEMBLY    Employer: [employer]  Tobacco Use  . Smoking status: Never Smoker  . Smokeless tobacco: Never Used  Substance and Sexual Activity  . Alcohol use: No  . Drug use: No  . Sexual activity: None  Other Topics Concern  . None  Social History Narrative  . None     Family History: The patient's family history includes Cancer in her sister; Carpal tunnel syndrome in her daughter and sister; Diabetes in her sister; Hyperlipidemia in her sister; Hypertension in her brother, daughter, mother, and sister. ROS:   Please see the history of present illness.    All 14 point review of systems negative except as described per history of present illness.  EKGs/Labs/Other Studies Reviewed:    The following studies were reviewed today: CT of her abdomen reviewed which show calcification of the aorta there is no description about severity of this finding. EKG showed normal sinus rhythm incomplete right bundle branch block.   Recent Labs: No results found for requested labs within last 8760 hours.  Recent Lipid Panel No results found for: CHOL, TRIG, HDL, CHOLHDL, VLDL, LDLCALC, LDLDIRECT  Physical Exam:    VS:  BP (!) 150/100 (BP Location: Right Arm, Patient Position: Sitting, Cuff Size: Large)   Pulse 88   Ht 5\' 1"  (1.549 m)   Wt 272 lb (123.4 kg)   SpO2 95%   BMI 51.39 kg/m     Wt Readings from Last 3 Encounters:  03/17/17 272 lb (123.4 kg)  01/09/15 266 lb (120.7 kg)  08/09/14 279 lb (126.6 kg)     GEN:  Well nourished, well developed in no acute distress HEENT: Normal NECK: No JVD; No carotid bruits LYMPHATICS: No lymphadenopathy CARDIAC: RRR, no murmurs, no rubs, no gallops RESPIRATORY:  Clear to auscultation without  rales, wheezing or rhonchi  ABDOMEN: Soft, non-tender, non-distended MUSCULOSKELETAL:  No edema; No deformity  SKIN: Warm and dry NEUROLOGIC:  Alert and oriented x 3 PSYCHIATRIC:  Normal affect   ASSESSMENT:    1. DIASTOLIC DYSFUNCTION   2. SLEEP APNEA, OBSTRUCTIVE   3. Dyspnea on exertion    PLAN:    In order of problems listed above:  1. Calcification of the aorta.  Threasa Beards does have multiple risk factors for coronary artery disease.  We spoke in length about what to do with the situation.  With her symptomatology which is somewhat atypical I still think it would be reasonable to proceed with stress testing.  I will scheduled to have Lexiscan 2 days protocol  because of her weight.  I will also schedule her to have echocardiogram to assess her left ventricular ejection fraction.  I will ask her to have fasting lipid profile done today to check and assess her risk factor. 2. Diastolic dysfunction, appears to be compensated but echocardiogram will be done to reassess that 3. Dyspnea on exertion of is the most likely multifactorial but I want to make sure we are not dealing with angina equivalent and stress test as well as echo will be done.   Medication Adjustments/Labs and Tests Ordered: Current medicines are reviewed at length with the patient today.  Concerns regarding medicines are outlined above.  No orders of the defined types were placed in this encounter.  No orders of the defined types were placed in this encounter.   Signed, Park Liter, MD, [facility]. 03/17/2017 4:41 PM    [facility]

## 2017-03-17 NOTE — Patient Instructions (Addendum)
Medication Instructions:  Your physician recommends that you continue on your current medications as directed. Please refer to the Current Medication list given to you today.  Labwork: None ordered  Testing/Procedures: EKG in office  Your physician has requested that you have a lexiscan myoview. For further information please visit [email or web address]. Please follow instruction sheet, as given.  Stress Test Directions for [facility]: 1.) Please check in at the outpatient center at [facility] the day of your testing. 2.) Nothing to eat or drink after midnight prior to testing. You may take your medications that morning with water except the metformin.   3.) Please be aware that the test can take up to 3-4 hours. This is a 2 day test process and you will follow the same instructions for both days.  4.) Should you have any problem with the appointment date or time, please call [phone].   Your physician has requested that you have an echocardiogram. Echocardiography is a painless test that uses sound waves to create images of your heart. It provides your doctor with information about the size and shape of your heart and how well your heart's chambers and valves are working. This procedure takes approximately one hour. There are no restrictions for this procedure.  We will contact you with your appointment at [facility]. Your Lexiscan Stress test will take 2 days to complete  Follow-Up: Your physician recommends that you schedule a follow-up appointment in: 1 month with Dr. Agustin Cree   Any Other Special Instructions Will Be Listed Below (If Applicable).     If you need a refill on your cardiac medications before your next appointment, please call your pharmacy.

## 2017-03-24 ENCOUNTER — Ambulatory Visit (INDEPENDENT_AMBULATORY_CARE_PROVIDER_SITE_OTHER): Payer: BLUE CROSS/BLUE SHIELD | Admitting: Allergy and Immunology

## 2017-03-24 VITALS — BP 122/84 | HR 76 | Resp 18

## 2017-03-24 DIAGNOSIS — J4550 Severe persistent asthma, uncomplicated: Secondary | ICD-10-CM

## 2017-03-24 DIAGNOSIS — M8080XD Other osteoporosis with current pathological fracture, unspecified site, subsequent encounter for fracture with routine healing: Secondary | ICD-10-CM | POA: Diagnosis not present

## 2017-03-24 DIAGNOSIS — J3089 Other allergic rhinitis: Secondary | ICD-10-CM | POA: Diagnosis not present

## 2017-03-24 DIAGNOSIS — K219 Gastro-esophageal reflux disease without esophagitis: Secondary | ICD-10-CM | POA: Diagnosis not present

## 2017-03-24 MED ORDER — BUDESONIDE-FORMOTEROL FUMARATE 160-4.5 MCG/ACT IN AERO: INHALATION_SPRAY | RESPIRATORY_TRACT | 1 refills | Status: DC

## 2017-03-24 NOTE — Patient Instructions (Addendum)
  1. Continue Symbicort 160 two puffs two times per day    2. Decrease Flovent 110 two puffs ONE time per day  3. Continue pantoprazole 40mg  twice a day  4. Continue Benralizumab (AUVI-Q 3.0 )   5. Continue nasonex one spray each nostril 1-2 times per day.   6. Continue Proair HFA or Albuterol nebulization if needed.  7. Can add OTC antihistamine - Zyrtec / Allegra / Claritin, and nasal saline  8. Obtain a bone density scan for osteoporosis  9. Return to clinic in 6 months or earlier if problem

## 2017-03-24 NOTE — Progress Notes (Signed)
Follow-up Note  Referring Provider: [name], MD Primary Provider: [name], MD Date of Office Visit: 03/24/2017  Subjective:   Beth Rodriguez (DOB: [birth year]) is a 56 y.o. female who returns to the [facility] [place] on 03/24/2017 in re-evaluation of the following:  HPI: Beth Rodriguez presents to this clinic in evaluation of her severe asthma treated with benralizumab, allergic rhinitis, history of chronic sinusitis, and reflux induced respiratory disease.  Her last visit to this clinic was 01 July 2016.  She has not required an antibiotic or systemic steroid to treat any type of respiratory tract issue since her last visit.  She still must use a short acting bronchodilator about 1 time per day while she continues on her large collection of anti-inflammatory medications administered to her respiratory tract.  She has had very little issues with her nose at this point.  She has had very little issues with her reflux.  She did fracture her left foot in September.  She has never had a bone scan completed.  She has been diagnosed with diverticulitis by Dr. Lyndel Safe and she she is scheduled to have a colonoscopy.  In investigation of this issue she had a CT scan of her abdomen which also identified atherosclerosis and she is now undergoing evaluation with a cardiologist in anticipation of having a stress test.  She did obtain a flu vaccine this year.  Allergies as of 03/24/2017      Reactions   Penicillin G Other (See Comments)   Unknown   Penicillins Hives   Theophylline    REACTION: headaches      Medication List      albuterol (2.5 MG/3ML) 0.083% nebulizer solution Commonly known as:  PROVENTIL Take 3 mLs (2.5 mg total) by nebulization every 6 (six) hours as needed for shortness of breath.   albuterol 108 (90 Base) MCG/ACT inhaler Commonly known as:  PROAIR HFA INHALE 2 PUFFS EVERY 4 TO 6 HOURS AS NEEDED FOR SEVERE COUGH OR WHEEZE.   AUVI-Q 0.3 mg/0.3 mL Soaj  injection Generic drug:  EPINEPHrine Use as directed for life threatening allergic reactions   AZO CRANBERRY PO Take 2 tablets by mouth daily.   Benralizumab 30 MG/ML Sosy Commonly known as:  FASENRA Inject 30 mg into the skin every 8 (eight) weeks.   budesonide-formoterol 160-4.5 MCG/ACT inhaler Commonly known as:  SYMBICORT Inhale two puffs twice daily as directed   fluticasone 110 MCG/ACT inhaler Commonly known as:  FLOVENT HFA Inhale two puffs twice daily. Rinse mouth after use.   levothyroxine 88 MCG tablet Commonly known as:  SYNTHROID, LEVOTHROID Take 88 mcg by mouth daily.   loratadine 10 MG tablet Commonly known as:  CLARITIN Take 10 mg by mouth daily as needed for allergies. Reported on 07/21/2015   losartan 100 MG tablet Commonly known as:  COZAAR Take 100 mg by mouth daily.   mometasone 50 MCG/ACT nasal spray Commonly known as:  NASONEX Use one spray in each nostril once or twice daily   pantoprazole 40 MG tablet Commonly known as:  PROTONIX Take 40 mg by mouth 2 (two) times daily.       Past Medical History:  Diagnosis Date  . Arthritis   . Asthma   . Carpal tunnel syndrome on right   . Embolism - blood clot 2007   right leg hx of  . Enlarged heart   . GERD (gastroesophageal reflux disease)   . Hypertension   . Hypothyroidism   .  Shortness of breath dyspnea    with exertion  . Sleep apnea    last sleep study over five years  . Thyroid disease    hypothyroidism    Past Surgical History:  Procedure Laterality Date  . CARPAL TUNNEL RELEASE Right   . EYE SURGERY Bilateral    cataract removal   . KNEE SURGERY  2007   right knee  . SHOULDER ARTHROSCOPY WITH OPEN ROTATOR CUFF REPAIR AND DISTAL CLAVICLE ACROMINECTOMY Right 08/09/2014   Procedure: SHOULDER ARTHROSCOPY WITH OPEN ROTATOR CUFF REPAIR AND DISTAL CLAVICLE ACROMINECTOMY;  Surgeon: Earlie Server, MD;  Location: [place];  Service: Orthopedics;  Laterality: Right;  . SINUS SURGERY WITH  INSTATRAK     ???    Review of systems negative except as noted in HPI / PMHx or noted below:  Review of Systems  Constitutional: Negative.   HENT: Negative.   Eyes: Negative.   Respiratory: Negative.   Cardiovascular: Negative.   Gastrointestinal: Negative.   Genitourinary: Negative.   Musculoskeletal: Negative.   Skin: Negative.   Neurological: Negative.   Endo/Heme/Allergies: Negative.   Psychiatric/Behavioral: Negative.      Objective:   Vitals:   03/24/17 1538  BP: 122/84  Pulse: 76  Resp: 18          Physical Exam  Constitutional: She is well-developed, well-nourished, and in no distress.  HENT:  Head: Normocephalic.  Right Ear: Tympanic membrane, external ear and ear canal normal.  Left Ear: Tympanic membrane, external ear and ear canal normal.  Nose: Nose normal. No mucosal edema or rhinorrhea.  Mouth/Throat: Uvula is midline, oropharynx is clear and moist and mucous membranes are normal. No oropharyngeal exudate.  Eyes: Conjunctivae are normal.  Neck: Trachea normal. No tracheal tenderness present. No tracheal deviation present. No thyromegaly present.  Cardiovascular: Normal rate, regular rhythm, S1 normal, S2 normal and normal heart sounds.  No murmur heard. Pulmonary/Chest: Breath sounds normal. No stridor. No respiratory distress. She has no wheezes. She has no rales.  Musculoskeletal: She exhibits no edema.  Lymphadenopathy:       Head (right side): No tonsillar adenopathy present.       Head (left side): No tonsillar adenopathy present.    She has no cervical adenopathy.  Neurological: She is alert. Gait normal.  Skin: No rash noted. She is not diaphoretic. No erythema. Nails show no clubbing.  Psychiatric: Mood and affect normal.    Diagnostics:    Spirometry was performed and demonstrated an FEV1 of 1.49 at 63 % of predicted.  The patient had an Asthma Control Test with the following results: ACT Total Score: 19.    Assessment and Plan:     1. Asthma, severe persistent, well-controlled   2. Other allergic rhinitis   3. LPRD (laryngopharyngeal reflux disease)   4. Other osteoporosis with current pathological fracture with routine healing, subsequent encounter     1. Continue Symbicort 160 two puffs two times per day    2. Decrease Flovent 110 two puffs ONE time per day  3. Continue pantoprazole 40mg  twice a day  4. Continue Benralizumab (AUVI-Q 3.0 )   5. Continue nasonex one spray each nostril 1-2 times per day.   6. Continue Proair HFA or Albuterol nebulization if needed.  7. Can add OTC antihistamine - Zyrtec / Allegra / Claritin, and nasal saline  8. Obtain a bone density scan for osteoporosis  9. Return to clinic in 6 months or earlier if problem  Beth Rodriguez is done  relatively well since her last visit and I will see if we can consolidate her inhaled steroid dose as noted above.  She will continue to use her anti-IL 5 biological agent on a regular basis as well as other medications for her respiratory tract.  Because she had a fracture of her foot we will obtain a bone density scan especially given her previous history of extensive systemic steroid use.  I will see her back in this clinic in 6 months or earlier if there is a problem.  Allena Katz, MD Allergy / Immunology [place]

## 2017-03-28 ENCOUNTER — Encounter: Payer: Self-pay | Admitting: Allergy and Immunology

## 2017-03-29 ENCOUNTER — Telehealth: Payer: Self-pay

## 2017-03-29 NOTE — Telephone Encounter (Signed)
Left message for patient to call the office.  Please inform her that she is scheduled for Bone Density Scan on Monday, December 31st to arrive at 8:30 am at [facility], outpatient entrance.  NOTE: No PA required per [facility]- ( call reference- Tamika, 11:12 am, 03-29-17) Form faxed to [facility].

## 2017-03-30 DIAGNOSIS — I517 Cardiomegaly: Secondary | ICD-10-CM | POA: Diagnosis not present

## 2017-03-30 DIAGNOSIS — G4733 Obstructive sleep apnea (adult) (pediatric): Secondary | ICD-10-CM | POA: Diagnosis not present

## 2017-03-30 NOTE — Telephone Encounter (Signed)
Patient advised of appt

## 2017-03-31 NOTE — Addendum Note (Signed)
Addended by: Lucrezia Starch I on: 03/31/2017 07:58 AM   Modules accepted: Orders

## 2017-04-07 ENCOUNTER — Other Ambulatory Visit: Payer: Self-pay | Admitting: Allergy and Immunology

## 2017-04-15 ENCOUNTER — Telehealth: Payer: Self-pay

## 2017-04-15 NOTE — Telephone Encounter (Signed)
Called patient to inform of BDS results per Dr. Neldon Mc. Scan show signs of osteopenia and recommend that patient follow up with PCP for treatment.

## 2017-04-18 ENCOUNTER — Ambulatory Visit (INDEPENDENT_AMBULATORY_CARE_PROVIDER_SITE_OTHER): Payer: BLUE CROSS/BLUE SHIELD | Admitting: Cardiology

## 2017-04-18 ENCOUNTER — Encounter: Payer: Self-pay | Admitting: Cardiology

## 2017-04-18 ENCOUNTER — Other Ambulatory Visit: Payer: Self-pay

## 2017-04-18 VITALS — BP 128/72 | HR 67 | Ht 61.0 in | Wt 272.0 lb

## 2017-04-18 DIAGNOSIS — I10 Essential (primary) hypertension: Secondary | ICD-10-CM

## 2017-04-18 DIAGNOSIS — I519 Heart disease, unspecified: Secondary | ICD-10-CM

## 2017-04-18 DIAGNOSIS — R9439 Abnormal result of other cardiovascular function study: Secondary | ICD-10-CM | POA: Diagnosis not present

## 2017-04-18 HISTORY — DX: Abnormal result of other cardiovascular function study: R94.39

## 2017-04-18 MED ORDER — ATORVASTATIN CALCIUM 10 MG PO TABS: 10.0000 mg | ORAL_TABLET | Freq: Every day | ORAL | 1 refills | Status: DC

## 2017-04-18 NOTE — Addendum Note (Signed)
Addended by: Mattie Marlin on: 04/18/2017 04:36 PM   Modules accepted: Orders

## 2017-04-18 NOTE — Progress Notes (Signed)
Cardiology Office Note:    Date:  04/18/2017   ID:  Beth Rodriguez, DOB 1960/08/25, MRN [id number]  PCP:  [name], MD  Cardiologist:  Jenne Campus, MD    Referring MD: [name], MD   Chief Complaint  Patient presents with  . Diastolic dysfunction  Doing fine she said that she can walk at [facility] with no difficulties.  She does have some mild exertional shortness of breath but overall seems to be doing well.  She did have a stress test which showed some questionable defect involving the apex suggesting possibility of distal LAD disease.  Overall she denies having any chest pain tightness squeezing pressure burning chest.  She also had echocardiogram done which showed preserved left ventricular ejection fraction.  History of Present Illness:    Beth Rodriguez is a 57 y.o. female with coronary artery disease calcification on the CT stress test showing possibility of distal LAD disease but she is asymptomatic.  We talked about options in this situation and we did reach conclusion that the best option for her will be medical therapy.  She is completely asymptomatic therefore I will ask her to start taking one baby aspirin every single day I will also put her on statin.  She does not want anything stronger I will give her Lipitor 10 mg daily.  Spent [place] of time explained to her was the rational for it and she agreed to continue.  Past Medical History:  Diagnosis Date  . Arthritis   . Asthma   . Carpal tunnel syndrome on right   . Embolism - blood clot 2007   right leg hx of  . Enlarged heart   . GERD (gastroesophageal reflux disease)   . Hypertension   . Hypothyroidism   . Shortness of breath dyspnea    with exertion  . Sleep apnea    last sleep study over five years  . Thyroid disease    hypothyroidism    Past Surgical History:  Procedure Laterality Date  . CARPAL TUNNEL RELEASE Right   . EYE SURGERY Bilateral    cataract removal   . KNEE SURGERY  2007   right knee  . SHOULDER ARTHROSCOPY WITH OPEN ROTATOR CUFF REPAIR AND DISTAL CLAVICLE ACROMINECTOMY Right 08/09/2014   Procedure: SHOULDER ARTHROSCOPY WITH OPEN ROTATOR CUFF REPAIR AND DISTAL CLAVICLE ACROMINECTOMY;  Surgeon: Earlie Server, MD;  Location: [place];  Service: Orthopedics;  Laterality: Right;  . SINUS SURGERY WITH INSTATRAK     ???    Current Medications: Current Meds  Medication Sig  . albuterol (PROVENTIL HFA;VENTOLIN HFA) 108 (90 Base) MCG/ACT inhaler INHALE 2 PUFFS EVERY 4 TO 6 HOURS AS NEEDED FOR SEVERE COUGH OR WHEEZE.  Marland Kitchen albuterol (PROVENTIL) (2.5 MG/3ML) 0.083% nebulizer solution Take 3 mLs (2.5 mg total) by nebulization every 6 (six) hours as needed for shortness of breath.  Marland Kitchen AUVI-Q 0.3 MG/0.3ML SOAJ injection Use as directed for life threatening allergic reactions  . Benralizumab (FASENRA) 30 MG/ML SOSY Inject 30 mg into the skin every 8 (eight) weeks.  . budesonide-formoterol (SYMBICORT) 160-4.5 MCG/ACT inhaler Inhale two puffs twice daily as directed to prevent cough or wheeze.  Rinse, gargle, and spit after use.  . Cranberry-Vitamin C-Probiotic (AZO CRANBERRY PO) Take 2 tablets by mouth daily.  . fluticasone (FLOVENT HFA) 110 MCG/ACT inhaler Inhale two puffs twice daily. Rinse mouth after use.  . levothyroxine (SYNTHROID, LEVOTHROID) 88 MCG tablet Take 88 mcg by mouth daily.  Marland Kitchen loratadine (CLARITIN)  10 MG tablet Take 10 mg by mouth daily as needed for allergies. Reported on 07/21/2015  . losartan (COZAAR) 100 MG tablet Take 100 mg by mouth daily.  . mometasone (NASONEX) 50 MCG/ACT nasal spray Use one spray in each nostril once or twice daily  . pantoprazole (PROTONIX) 40 MG tablet Take 40 mg by mouth 2 (two) times daily.     Current Facility-Administered Medications for the 04/18/17 encounter (Office Visit) with Park Liter, MD  Medication  . Benralizumab SOSY 30 mg     Allergies:   Penicillin g; Penicillins; and Theophylline   Social History    Socioeconomic History  . Marital status: Married    Spouse name: None  . Number of children: 1  . Years of education: None  . Highest education level: None  Social Needs  . Financial resource strain: None  . Food insecurity - worry: None  . Food insecurity - inability: None  . Transportation needs - medical: None  . Transportation needs - non-medical: None  Occupational History  . Occupation: PRODUCTION ASSEMBLY    Employer: [employer]  Tobacco Use  . Smoking status: Never Smoker  . Smokeless tobacco: Never Used  Substance and Sexual Activity  . Alcohol use: No  . Drug use: No  . Sexual activity: None  Other Topics Concern  . None  Social History Narrative  . None     Family History: The patient's family history includes Cancer in her sister; Carpal tunnel syndrome in her daughter and sister; Diabetes in her sister; Hyperlipidemia in her sister; Hypertension in her brother, daughter, mother, and sister. ROS:   Please see the history of present illness.    All 14 point review of systems negative except as described per history of present illness  EKGs/Labs/Other Studies Reviewed:      Recent Labs: No results found for requested labs within last 8760 hours.  Recent Lipid Panel No results found for: CHOL, TRIG, HDL, CHOLHDL, VLDL, LDLCALC, LDLDIRECT  Physical Exam:    VS:  BP 128/72 (BP Location: Left Arm, Patient Position: Sitting, Cuff Size: Large)   Pulse 67   Ht 5\' 1"  (1.549 m)   Wt 272 lb (123.4 kg)   SpO2 99%   BMI 51.39 kg/m     Wt Readings from Last 3 Encounters:  04/18/17 272 lb (123.4 kg)  03/17/17 272 lb (123.4 kg)  01/09/15 266 lb (120.7 kg)     GEN:  Well nourished, well developed in no acute distress HEENT: Normal NECK: No JVD; No carotid bruits LYMPHATICS: No lymphadenopathy CARDIAC: RRR, no murmurs, no rubs, no gallops RESPIRATORY:  Clear to auscultation without rales, wheezing or rhonchi  ABDOMEN: Soft, non-tender,  non-distended MUSCULOSKELETAL:  No edema; No deformity  SKIN: Warm and dry LOWER EXTREMITIES: no swelling NEUROLOGIC:  Alert and oriented x 3 PSYCHIATRIC:  Normal affect   ASSESSMENT:    1. DIASTOLIC DYSFUNCTION   2. Abnormal stress test   3. Essential hypertension    PLAN:    In order of problems listed above:  1. Diastolic dysfunction: Stable on appropriate medication which I will continue. 2. Mildly abnormal stress test.  Showing possibility of ischemia involving apex which would indicate distal LAD.  I think we are at the point that we can continue just medical therapy as she wished.  I asked her to start taking baby aspirin every single day and continue with statin.  Obviously in the future if she develops symptoms cardiac catheterization  could be an option to her morbid obesity cardiac catheterization carry some high risk at this point and if we can manage this medically that would be more appropriate with course of action. 3. Essential hypertension: Blood pressure appears to be well controlled. 4. Dyslipidemia likely her HDL is quite high but LDL is 101 therefore I will initiate atorvastatin   Medication Adjustments/Labs and Tests Ordered: Current medicines are reviewed at length with the patient today.  Concerns regarding medicines are outlined above.  No orders of the defined types were placed in this encounter.  Medication changes: No orders of the defined types were placed in this encounter.   Signed, Park Liter, MD, [facility] 04/18/2017 4:19 PM    [place]

## 2017-04-18 NOTE — Patient Instructions (Addendum)
Medication Instructions:  Your physician has recommended you make the following change in your medication:  START lipitor 10 mg every evening  Labwork: None  Testing/Procedures: None  Follow-Up: Your physician recommends that you schedule a follow-up appointment in: 3 months  Any Other Special Instructions Will Be Listed Below (If Applicable).     If you need a refill on your cardiac medications before your next appointment, please call your pharmacy.   [place], RN, BSN  Atorvastatin tablets What is this medicine? ATORVASTATIN (a TORE va sta tin) is known as a HMG-CoA reductase inhibitor or 'statin'. It lowers the level of cholesterol and triglycerides in the blood. This drug may also reduce the risk of heart attack, stroke, or other health problems in patients with risk factors for heart disease. Diet and lifestyle changes are often used with this drug. This medicine may be used for other purposes; ask your health care provider or pharmacist if you have questions. COMMON BRAND NAME(S): Lipitor What should I tell my health care provider before I take this medicine? They need to know if you have any of these conditions: -frequently drink alcoholic beverages -history of stroke, TIA -kidney disease -liver disease -muscle aches or weakness -other medical condition -an unusual or allergic reaction to atorvastatin, other medicines, foods, dyes, or preservatives -pregnant or trying to get pregnant -breast-feeding How should I use this medicine? Take this medicine by mouth with a glass of water. Follow the directions on the prescription label. You can take this medicine with or without food. Take your doses at regular intervals. Do not take your medicine more often than directed. Talk to your pediatrician regarding the use of this medicine in children. While this drug may be prescribed for children as young as 49 years old for selected conditions, precautions do  apply. Overdosage: If you think you have taken too much of this medicine contact a poison control center or emergency room at once. NOTE: This medicine is only for you. Do not share this medicine with others. What if I miss a dose? If you miss a dose, take it as soon as you can. If it is almost time for your next dose, take only that dose. Do not take double or extra doses. What may interact with this medicine? Do not take this medicine with any of the following medications: -red yeast rice -telaprevir -telithromycin -voriconazole This medicine may also interact with the following medications: -alcohol -antiviral medicines for HIV or AIDS -boceprevir -certain antibiotics like clarithromycin, erythromycin, troleandomycin -certain medicines for cholesterol like fenofibrate or gemfibrozil -cimetidine -clarithromycin -colchicine -cyclosporine -digoxin -female hormones, like estrogens or progestins and birth control pills -grapefruit juice -medicines for fungal infections like fluconazole, itraconazole, ketoconazole -niacin -rifampin -spironolactone This list may not describe all possible interactions. Give your health care provider a list of all the medicines, herbs, non-prescription drugs, or dietary supplements you use. Also tell them if you smoke, drink alcohol, or use illegal drugs. Some items may interact with your medicine. What should I watch for while using this medicine? Visit your doctor or health care professional for regular check-ups. You may need regular tests to make sure your liver is working properly. Tell your doctor or health care professional right away if you get any unexplained muscle pain, tenderness, or weakness, especially if you also have a fever and tiredness. Your doctor or health care professional may tell you to stop taking this medicine if you develop muscle problems. If your muscle  problems do not go away after stopping this medicine, contact your health  care professional. This drug is only part of a total heart-health program. Your doctor or a dietician can suggest a low-cholesterol and low-fat diet to help. Avoid alcohol and smoking, and keep a proper exercise schedule. Do not use this drug if you are pregnant or breast-feeding. Serious side effects to an unborn child or to an infant are possible. Talk to your doctor or pharmacist for more information. This medicine may affect blood sugar levels. If you have diabetes, check with your doctor or health care professional before you change your diet or the dose of your diabetic medicine. If you are going to have surgery tell your health care professional that you are taking this drug. What side effects may I notice from receiving this medicine? Side effects that you should report to your doctor or health care professional as soon as possible: -allergic reactions like skin rash, itching or hives, swelling of the face, lips, or tongue -dark urine -fever -joint pain -muscle cramps, pain -redness, blistering, peeling or loosening of the skin, including inside the mouth -trouble passing urine or change in the amount of urine -unusually weak or tired -yellowing of eyes or skin Side effects that usually do not require medical attention (report to your doctor or health care professional if they continue or are bothersome): -constipation -heartburn -stomach gas, pain, upset This list may not describe all possible side effects. Call your doctor for medical advice about side effects. You may report side effects to FDA at 1-800-FDA-1088. Where should I keep my medicine? Keep out of the reach of children. Store at room temperature between 20 to 25 degrees C (68 to 77 degrees F). Throw away any unused medicine after the expiration date. NOTE: This sheet is a summary. It may not cover all possible information. If you have questions about this medicine, talk to your doctor, pharmacist, or health care  provider.  2018 Elsevier/Gold Standard (2011-02-16 74:94:49)

## 2017-04-23 ENCOUNTER — Encounter: Payer: Self-pay | Admitting: Allergy and Immunology

## 2017-05-23 ENCOUNTER — Ambulatory Visit (INDEPENDENT_AMBULATORY_CARE_PROVIDER_SITE_OTHER): Payer: BLUE CROSS/BLUE SHIELD | Admitting: *Deleted

## 2017-05-23 DIAGNOSIS — J4550 Severe persistent asthma, uncomplicated: Secondary | ICD-10-CM | POA: Diagnosis not present

## 2017-06-02 DIAGNOSIS — M8589 Other specified disorders of bone density and structure, multiple sites: Secondary | ICD-10-CM

## 2017-06-02 HISTORY — DX: Other specified disorders of bone density and structure, multiple sites: M85.89

## 2017-07-14 ENCOUNTER — Ambulatory Visit (INDEPENDENT_AMBULATORY_CARE_PROVIDER_SITE_OTHER): Payer: BLUE CROSS/BLUE SHIELD | Admitting: *Deleted

## 2017-07-14 DIAGNOSIS — J4550 Severe persistent asthma, uncomplicated: Secondary | ICD-10-CM

## 2017-07-18 ENCOUNTER — Ambulatory Visit: Payer: Self-pay

## 2017-07-21 ENCOUNTER — Ambulatory Visit (INDEPENDENT_AMBULATORY_CARE_PROVIDER_SITE_OTHER): Payer: BLUE CROSS/BLUE SHIELD | Admitting: Cardiology

## 2017-07-21 ENCOUNTER — Encounter: Payer: Self-pay | Admitting: Cardiology

## 2017-07-21 VITALS — BP 120/80 | HR 70 | Ht 61.0 in | Wt 271.8 lb

## 2017-07-21 DIAGNOSIS — I519 Heart disease, unspecified: Secondary | ICD-10-CM | POA: Diagnosis not present

## 2017-07-21 DIAGNOSIS — I2510 Atherosclerotic heart disease of native coronary artery without angina pectoris: Secondary | ICD-10-CM

## 2017-07-21 DIAGNOSIS — I10 Essential (primary) hypertension: Secondary | ICD-10-CM

## 2017-07-21 HISTORY — DX: Atherosclerotic heart disease of native coronary artery without angina pectoris: I25.10

## 2017-07-21 NOTE — Progress Notes (Signed)
Cardiology Office Note:    Date:  07/21/2017   ID:  Beth Rodriguez, DOB January 09, 1961, MRN [id number]  PCP:  Beth Rodriguez., MD  Cardiologist:  Beth Campus, MD    Referring MD: Beth Rodriguez., MD   Chief Complaint  Patient presents with  . Follow-up  Doing well  History of Present Illness:    Beth Rodriguez is a 57 y.o. female with abnormal stress test indicating ischemia involving apex.  However overall clinically she is doing very well she is asymptomatic.  She works full-time on her work required physical work she has no difficulty doing it there is no chest pain tightness squeezing pressure burning chest.  We elected to continuation of medical therapy only.  Last time I put her on Lipitor we will check a fasting lipid profile today.  Past Medical History:  Diagnosis Date  . Arthritis   . Asthma   . Carpal tunnel syndrome on right   . Embolism - blood clot 2007   right leg hx of  . Enlarged heart   . GERD (gastroesophageal reflux disease)   . Hypertension   . Hypothyroidism   . Shortness of breath dyspnea    with exertion  . Sleep apnea    last sleep study over five years  . Thyroid disease    hypothyroidism    Past Surgical History:  Procedure Laterality Date  . CARPAL TUNNEL RELEASE Right   . EYE SURGERY Bilateral    cataract removal   . KNEE SURGERY  2007   right knee  . SHOULDER ARTHROSCOPY WITH OPEN ROTATOR CUFF REPAIR AND DISTAL CLAVICLE ACROMINECTOMY Right 08/09/2014   Procedure: SHOULDER ARTHROSCOPY WITH OPEN ROTATOR CUFF REPAIR AND DISTAL CLAVICLE ACROMINECTOMY;  Surgeon: Earlie Server, MD;  Location: [facility];  Service: Orthopedics;  Laterality: Right;  . SINUS SURGERY WITH INSTATRAK     ???    Current Medications: Current Meds  Medication Sig  . albuterol (PROVENTIL HFA;VENTOLIN HFA) 108 (90 Base) MCG/ACT inhaler INHALE 2 PUFFS EVERY 4 TO 6 HOURS AS NEEDED FOR SEVERE COUGH OR WHEEZE.  Marland Kitchen albuterol (PROVENTIL) (2.5 MG/3ML) 0.083% nebulizer solution  Take 3 mLs (2.5 mg total) by nebulization every 6 (six) hours as needed for shortness of breath.  Marland Kitchen aspirin EC 81 MG tablet Take 1 tablet by mouth daily.  Marland Kitchen atorvastatin (LIPITOR) 10 MG tablet Take 1 tablet (10 mg total) by mouth daily at 6 PM.  . AUVI-Q 0.3 MG/0.3ML SOAJ injection Use as directed for life threatening allergic reactions  . Benralizumab (FASENRA) 30 MG/ML SOSY Inject 30 mg into the skin every 8 (eight) weeks.  . budesonide-formoterol (SYMBICORT) 160-4.5 MCG/ACT inhaler Inhale two puffs twice daily as directed to prevent cough or wheeze.  Rinse, gargle, and spit after use.  . Cranberry-Vitamin C-Probiotic (AZO CRANBERRY PO) Take 2 tablets by mouth daily.  . fluticasone (FLOVENT HFA) 110 MCG/ACT inhaler Inhale two puffs twice daily. Rinse mouth after use.  . ibandronate (BONIVA) 150 MG tablet Take 1 tablet by mouth every 28 (twenty-eight) days.  Marland Kitchen levothyroxine (SYNTHROID, LEVOTHROID) 88 MCG tablet Take 88 mcg by mouth daily.  Marland Kitchen loratadine (CLARITIN) 10 MG tablet Take 10 mg by mouth daily as needed for allergies. Reported on 07/21/2015  . losartan (COZAAR) 100 MG tablet Take 100 mg by mouth daily.  . mometasone (NASONEX) 50 MCG/ACT nasal spray Use one spray in each nostril once or twice daily  . pantoprazole (PROTONIX) 40 MG tablet Take 40 mg by  mouth 2 (two) times daily.     Current Facility-Administered Medications for the 07/21/17 encounter (Office Visit) with Park Liter, MD  Medication  . Benralizumab SOSY 30 mg     Allergies:   Penicillin g; Penicillins; and Theophylline   Social History   Socioeconomic History  . Marital status: Married    Spouse name: Not on file  . Number of children: 1  . Years of education: Not on file  . Highest education level: Not on file  Occupational History  . Occupation: PRODUCTION ASSEMBLY    Employer: [employer]  Social Needs  . Financial resource strain: Not on file  . Food insecurity:    Worry: Not on file     Inability: Not on file  . Transportation needs:    Medical: Not on file    Non-medical: Not on file  Tobacco Use  . Smoking status: Never Smoker  . Smokeless tobacco: Never Used  Substance and Sexual Activity  . Alcohol use: No  . Drug use: No  . Sexual activity: Not on file  Lifestyle  . Physical activity:    Days per week: Not on file    Minutes per session: Not on file  . Stress: Not on file  Relationships  . Social connections:    Talks on phone: Not on file    Gets together: Not on file    Attends religious service: Not on file    Active member of club or organization: Not on file    Attends meetings of clubs or organizations: Not on file    Relationship status: Not on file  Other Topics Concern  . Not on file  Social History Narrative  . Not on file     Family History: The patient's family history includes Cancer in her sister; Carpal tunnel syndrome in her daughter and sister; Diabetes in her sister; Hyperlipidemia in her sister; Hypertension in her brother, daughter, mother, and sister. ROS:   Please see the history of present illness.    All 14 point review of systems negative except as described per history of present illness  EKGs/Labs/Other Studies Reviewed:      Recent Labs: No results found for requested labs within last 8760 hours.  Recent Lipid Panel No results found for: CHOL, TRIG, HDL, CHOLHDL, VLDL, LDLCALC, LDLDIRECT  Physical Exam:    VS:  BP 120/80   Pulse 70   Ht 5\' 1"  (1.549 m)   Wt 271 lb 12.8 oz (123.3 kg)   SpO2 97%   BMI 51.36 kg/m     Wt Readings from Last 3 Encounters:  07/21/17 271 lb 12.8 oz (123.3 kg)  04/18/17 272 lb (123.4 kg)  03/17/17 272 lb (123.4 kg)     GEN:  Well nourished, well developed in no acute distress HEENT: Normal NECK: No JVD; No carotid bruits LYMPHATICS: No lymphadenopathy CARDIAC: RRR, no murmurs, no rubs, no gallops RESPIRATORY:  Clear to auscultation without rales, wheezing or rhonchi  ABDOMEN:  Soft, non-tender, non-distended MUSCULOSKELETAL:  No edema; No deformity  SKIN: Warm and dry LOWER EXTREMITIES: no swelling NEUROLOGIC:  Alert and oriented x 3 PSYCHIATRIC:  Normal affect   ASSESSMENT:    1. DIASTOLIC DYSFUNCTION   2. Essential hypertension   3. OBESITY, MORBID   4. Coronary artery disease involving native coronary artery of native heart without angina pectoris    PLAN:    In order of problems listed above:  1. Coronary artery disease with mildly  abnormal stress test but patient asymptomatic and favors medical therapy which will continue. 2. Dyslipidemia: We will check a fasting lipid profile today. 3. Obesity: Obviously problem she is trying to work on losing weight. 4. Diastolic dysfunction: Appears to be hemodynamically compensated.  I see her back in my office in about 3 months or sooner if she has any problem   Medication Adjustments/Labs and Tests Ordered: Current medicines are reviewed at length with the patient today.  Concerns regarding medicines are outlined above.  No orders of the defined types were placed in this encounter.  Medication changes: No orders of the defined types were placed in this encounter.   Signed, Park Liter, MD, [facility] 07/21/2017 10:00 AM    [place]

## 2017-07-21 NOTE — Patient Instructions (Signed)
Medication Instructions:  Your physician recommends that you continue on your current medications as directed. Please refer to the Current Medication list given to you today.   Labwork: Your physician recommends that you return for lab work today: AST, ALT, lipid panel,   Testing/Procedures: None  Follow-Up: Your physician wants you to follow-up in: 3 months. You will receive a reminder letter in the mail two months in advance. If you don't receive a letter, please call our office to schedule the follow-up appointment.   Any Other Special Instructions Will Be Listed Below (If Applicable).     If you need a refill on your cardiac medications before your next appointment, please call your pharmacy.

## 2017-07-22 ENCOUNTER — Ambulatory Visit: Payer: BLUE CROSS/BLUE SHIELD | Admitting: Cardiology

## 2017-07-22 LAB — LIPID PANEL
Chol/HDL Ratio: 2.2 ratio (ref 0.0–4.4)
Cholesterol, Total: 137 mg/dL (ref 100–199)
HDL: 62 mg/dL (ref >39)
LDL CALC: 66 mg/dL (ref 0–99)
Triglycerides: 44 mg/dL (ref 0–149)
VLDL Cholesterol Cal: 9 mg/dL (ref 5–40)

## 2017-07-22 LAB — ALT: ALT: 12 IU/L (ref 0–32)

## 2017-07-22 LAB — AST: AST: 22 IU/L (ref 0–40)

## 2017-07-25 ENCOUNTER — Encounter (INDEPENDENT_AMBULATORY_CARE_PROVIDER_SITE_OTHER): Payer: Self-pay

## 2017-08-11 ENCOUNTER — Other Ambulatory Visit: Payer: Self-pay | Admitting: Allergy and Immunology

## 2017-08-24 ENCOUNTER — Other Ambulatory Visit: Payer: Self-pay | Admitting: Allergy and Immunology

## 2017-09-08 ENCOUNTER — Ambulatory Visit (INDEPENDENT_AMBULATORY_CARE_PROVIDER_SITE_OTHER): Payer: BLUE CROSS/BLUE SHIELD | Admitting: *Deleted

## 2017-09-08 DIAGNOSIS — J4550 Severe persistent asthma, uncomplicated: Secondary | ICD-10-CM

## 2017-09-20 ENCOUNTER — Other Ambulatory Visit: Payer: Self-pay | Admitting: Allergy and Immunology

## 2017-09-29 ENCOUNTER — Other Ambulatory Visit: Payer: Self-pay | Admitting: Allergy and Immunology

## 2017-09-30 ENCOUNTER — Telehealth: Payer: Self-pay | Admitting: Allergy and Immunology

## 2017-09-30 MED ORDER — BUDESONIDE-FORMOTEROL FUMARATE 160-4.5 MCG/ACT IN AERO: INHALATION_SPRAY | RESPIRATORY_TRACT | 0 refills | Status: DC

## 2017-09-30 NOTE — Telephone Encounter (Signed)
Rx refill sent to [facility]

## 2017-09-30 NOTE — Telephone Encounter (Signed)
Beth Rodriguez called in and would like a refill on Symbicort.  I informed patient she needed a follow up appointment.  Beth Rodriguez states she will make the appointment when she comes in for her next injection.

## 2017-09-30 NOTE — Addendum Note (Signed)
Addended by: Carin Hock on: 09/30/2017 02:24 PM   Modules accepted: Orders

## 2017-10-03 DIAGNOSIS — K5790 Diverticulosis of intestine, part unspecified, without perforation or abscess without bleeding: Secondary | ICD-10-CM | POA: Insufficient documentation

## 2017-10-03 HISTORY — DX: Diverticulosis of intestine, part unspecified, without perforation or abscess without bleeding: K57.90

## 2017-10-21 ENCOUNTER — Other Ambulatory Visit: Payer: Self-pay | Admitting: Allergy and Immunology

## 2017-11-03 ENCOUNTER — Ambulatory Visit: Payer: Self-pay

## 2017-11-03 ENCOUNTER — Ambulatory Visit (INDEPENDENT_AMBULATORY_CARE_PROVIDER_SITE_OTHER): Payer: BLUE CROSS/BLUE SHIELD | Admitting: Allergy and Immunology

## 2017-11-03 ENCOUNTER — Encounter: Payer: Self-pay | Admitting: Allergy and Immunology

## 2017-11-03 VITALS — BP 120/82 | HR 83 | Resp 17 | Ht 60.0 in | Wt 278.4 lb

## 2017-11-03 DIAGNOSIS — J4550 Severe persistent asthma, uncomplicated: Secondary | ICD-10-CM | POA: Diagnosis not present

## 2017-11-03 DIAGNOSIS — K219 Gastro-esophageal reflux disease without esophagitis: Secondary | ICD-10-CM

## 2017-11-03 DIAGNOSIS — J3089 Other allergic rhinitis: Secondary | ICD-10-CM | POA: Diagnosis not present

## 2017-11-03 MED ORDER — ALBUTEROL SULFATE HFA 108 (90 BASE) MCG/ACT IN AERS: 1.0000 | INHALATION_SPRAY | RESPIRATORY_TRACT | 2 refills | Status: DC | PRN

## 2017-11-03 MED ORDER — BUDESONIDE-FORMOTEROL FUMARATE 160-4.5 MCG/ACT IN AERO: INHALATION_SPRAY | RESPIRATORY_TRACT | 5 refills | Status: DC

## 2017-11-03 NOTE — Patient Instructions (Addendum)
  1. Continue Symbicort 160 two puffs two times per day    2. Add Flovent 110 2 inhalations two times per day during increased asthma activity  3. Continue pantoprazole 40mg twice a day  4. Continue Benralizumab (AUVI-Q 3.0 )   5. Continue nasonex one spray each nostril 1-2 times per day.   6. Continue Proair HFA or Albuterol nebulization if needed.  7. Can add OTC antihistamine - Zyrtec / Allegra / Claritin, and nasal saline  8. Return to clinic in 6 months or earlier if problem 

## 2017-11-03 NOTE — Progress Notes (Signed)
Follow-up Note  Referring Provider: [name], MD Primary Provider: [name], MD Date of Office Visit: 11/03/2017  Subjective:   Beth Rodriguez (DOB: [birth year]) is a 57 y.o. female who returns to the [facility] on 11/03/2017 in re-evaluation of the following:  HPI: Beth Rodriguez returns to this clinic in reevaluation of her severe asthma treated with benralizumab, allergic rhinitis and a history of chronic sinusitis and a history of reflux induced respiratory disease.  Her last visit to this clinic was 24 March 2017.  Once again she has not required the administration of an antibiotic or systemic steroid to treat any type of respiratory tract issue.  Thus, it has been almost 1 year since she has required these medications to treat respiratory symptoms.  She still uses a bronchodilator usually 1 time per day and occasionally twice a day.  She can sleep at nighttime at this point without significant respiratory tract symptoms.  She has been consistently using a combination of Symbicort and Flovent 2 inhalations each twice a day and continues on benralizumab injections.  Her nose is doing quite well and she is using Nasonex pretty much on an as-needed basis.  Her reflux is under very good control using a proton pump inhibitor twice a day.  She is now on Boniva for osteoporosis.  Allergies as of 11/03/2017      Reactions   Penicillin G Other (See Comments)   Unknown   Penicillins Hives   Theophylline    REACTION: headaches      Medication List      albuterol (2.5 MG/3ML) 0.083% nebulizer solution Commonly known as:  PROVENTIL Take 3 mLs (2.5 mg total) by nebulization every 6 (six) hours as needed for shortness of breath.   albuterol 108 (90 Base) MCG/ACT inhaler Commonly known as:  PROVENTIL HFA;VENTOLIN HFA Inhale 1-2 puffs into the lungs every 4 (four) hours as needed for wheezing or shortness of breath.   aspirin EC 81 MG tablet Take 1 tablet by  mouth daily.   atorvastatin 10 MG tablet Commonly known as:  LIPITOR Take 1 tablet (10 mg total) by mouth daily at 6 PM.   AUVI-Q 0.3 mg/0.3 mL Soaj injection Generic drug:  EPINEPHrine Use as directed for life threatening allergic reactions   AZO CRANBERRY PO Take 2 tablets by mouth daily.   budesonide-formoterol 160-4.5 MCG/ACT inhaler Commonly known as:  SYMBICORT PLEASE SEE ATTACHED FOR DETAILED DIRECTIONS   FASENRA 30 MG/ML Sosy Generic drug:  Benralizumab INJECT 30 MG INTO THE SKIN EVERY 8 (EIGHT) WEEKS.   fluticasone 110 MCG/ACT inhaler Commonly known as:  FLOVENT HFA INHALE TWO PUFFS TWICE DAILY. RINSE MOUTH AFTER USE.   HYDROcodone-acetaminophen 5-325 MG tablet Commonly known as:  NORCO/VICODIN TAKE 1-2 TABLETS BY MOUTH EVERY 6 HOURS AS NEEDED FOR UP TO 5 DAYS FOR PAIN   ibandronate 150 MG tablet Commonly known as:  BONIVA Take 1 tablet by mouth every 28 (twenty-eight) days.   levothyroxine 88 MCG tablet Commonly known as:  SYNTHROID, LEVOTHROID Take 88 mcg by mouth daily.   loratadine 10 MG tablet Commonly known as:  CLARITIN Take 10 mg by mouth daily as needed for allergies. Reported on 07/21/2015   losartan 100 MG tablet Commonly known as:  COZAAR Take 100 mg by mouth daily.   mometasone 50 MCG/ACT nasal spray Commonly known as:  NASONEX Use one spray in each nostril once or twice daily   pantoprazole 40 MG tablet Commonly  known as:  PROTONIX Take 40 mg by mouth 2 (two) times daily.       Past Medical History:  Diagnosis Date  . Arthritis   . Asthma   . Carpal tunnel syndrome on right   . Embolism - blood clot 2007   right leg hx of  . Enlarged heart   . GERD (gastroesophageal reflux disease)   . Hypertension   . Hypothyroidism   . Shortness of breath dyspnea    with exertion  . Sleep apnea    last sleep study over five years  . Thyroid disease    hypothyroidism    Past Surgical History:  Procedure Laterality Date  . CARPAL  TUNNEL RELEASE Right   . EYE SURGERY Bilateral    cataract removal   . KNEE SURGERY  2007   right knee  . SHOULDER ARTHROSCOPY WITH OPEN ROTATOR CUFF REPAIR AND DISTAL CLAVICLE ACROMINECTOMY Right 08/09/2014   Procedure: SHOULDER ARTHROSCOPY WITH OPEN ROTATOR CUFF REPAIR AND DISTAL CLAVICLE ACROMINECTOMY;  Surgeon: Earlie Server, MD;  Location: [place];  Service: Orthopedics;  Laterality: Right;  . SINUS SURGERY WITH INSTATRAK     ???    Review of systems negative except as noted in HPI / PMHx or noted below:  Review of Systems  Constitutional: Negative.   HENT: Negative.   Eyes: Negative.   Respiratory: Negative.   Cardiovascular: Negative.   Gastrointestinal: Negative.   Genitourinary: Negative.   Musculoskeletal: Negative.   Skin: Negative.   Neurological: Negative.   Endo/Heme/Allergies: Negative.   Psychiatric/Behavioral: Negative.      Objective:   Vitals:   11/03/17 1543  BP: 120/82  Pulse: 83  Resp: 17  SpO2: 98%   Height: 5' (152.4 cm)  Weight: 278 lb 6.4 oz (126.3 kg)   Physical Exam  HENT:  Head: Normocephalic.  Right Ear: Tympanic membrane, external ear and ear canal normal.  Left Ear: Tympanic membrane, external ear and ear canal normal.  Nose: Nose normal. No mucosal edema or rhinorrhea.  Mouth/Throat: Uvula is midline, oropharynx is clear and moist and mucous membranes are normal. No oropharyngeal exudate.  Eyes: Conjunctivae are normal.  Neck: Trachea normal. No tracheal tenderness present. No tracheal deviation present. No thyromegaly present.  Cardiovascular: Normal rate, regular rhythm, S1 normal, S2 normal and normal heart sounds.  No murmur heard. Pulmonary/Chest: Breath sounds normal. No stridor. No respiratory distress. She has no wheezes. She has no rales.  Musculoskeletal: She exhibits no edema.  Lymphadenopathy:       Head (right side): No tonsillar adenopathy present.       Head (left side): No tonsillar adenopathy present.    She has  no cervical adenopathy.  Neurological: She is alert.  Skin: No rash noted. She is not diaphoretic. No erythema. Nails show no clubbing.    Diagnostics:    Spirometry was performed and demonstrated an FEV1 of 1.34 at 60 % of predicted.  The patient had an Asthma Control Test with the following results: ACT Total Score: 14.    Assessment and Plan:   1. Severe persistent asthma without complication   2. Other allergic rhinitis   3. LPRD (laryngopharyngeal reflux disease)     1. Continue Symbicort 160 two puffs two times per day    2. Add Flovent 110 2 inhalations two times per day during increased asthma activity  3. Continue pantoprazole 40mg  twice a day  4. Continue Benralizumab (AUVI-Q 3.0 )   5. Continue nasonex one spray  each nostril 1-2 times per day.   6. Continue Proair HFA or Albuterol nebulization if needed.  7. Can add OTC antihistamine - Zyrtec / Allegra / Claritin, and nasal saline  8. Return to clinic in 6 months or earlier if problem  Beth Rodriguez is stable on a very large collection of anti-inflammatory agents including the use of benralizumab directed at her respiratory tract.  At this point she will continue to utilize the plan noted above including all of her anti-inflammatory agents for her respiratory track and therapy directed against reflux and I will see her back in this clinic in 6 months or earlier if there is a problem.  Allena Katz, MD Allergy / Immunology [place]

## 2017-11-04 ENCOUNTER — Encounter: Payer: Self-pay | Admitting: Allergy and Immunology

## 2017-11-04 ENCOUNTER — Other Ambulatory Visit: Payer: Self-pay | Admitting: *Deleted

## 2017-11-04 MED ORDER — BUDESONIDE-FORMOTEROL FUMARATE 160-4.5 MCG/ACT IN AERO: 2.0000 | INHALATION_SPRAY | Freq: Two times a day (BID) | RESPIRATORY_TRACT | 5 refills | Status: DC

## 2017-12-06 ENCOUNTER — Other Ambulatory Visit: Payer: Self-pay | Admitting: Cardiology

## 2017-12-29 ENCOUNTER — Ambulatory Visit (INDEPENDENT_AMBULATORY_CARE_PROVIDER_SITE_OTHER): Payer: BLUE CROSS/BLUE SHIELD

## 2017-12-29 DIAGNOSIS — J4550 Severe persistent asthma, uncomplicated: Secondary | ICD-10-CM

## 2017-12-30 ENCOUNTER — Other Ambulatory Visit: Payer: Self-pay | Admitting: Orthopaedic Surgery

## 2017-12-30 DIAGNOSIS — M545 Low back pain: Secondary | ICD-10-CM

## 2018-01-07 ENCOUNTER — Ambulatory Visit
Admission: RE | Admit: 2018-01-07 | Discharge: 2018-01-07 | Disposition: A | Discharge status: Home / Self Care | Payer: BLUE CROSS/BLUE SHIELD | Source: Ambulatory Visit | Attending: Orthopaedic Surgery | Admitting: Orthopaedic Surgery

## 2018-01-07 DIAGNOSIS — M545 Low back pain: Secondary | ICD-10-CM

## 2018-01-07 MED ORDER — GADOBENATE DIMEGLUMINE 529 MG/ML IV SOLN
20.0000 mL | Freq: Once | INTRAVENOUS | Status: AC | PRN
  Administered 2018-01-07: 20 mL via INTRAVENOUS

## 2018-01-27 ENCOUNTER — Ambulatory Visit: Payer: BLUE CROSS/BLUE SHIELD | Admitting: Cardiology

## 2018-02-11 ENCOUNTER — Other Ambulatory Visit: Payer: Self-pay | Admitting: Allergy and Immunology

## 2018-02-23 ENCOUNTER — Ambulatory Visit (INDEPENDENT_AMBULATORY_CARE_PROVIDER_SITE_OTHER): Payer: BLUE CROSS/BLUE SHIELD | Admitting: *Deleted

## 2018-02-23 DIAGNOSIS — J4550 Severe persistent asthma, uncomplicated: Secondary | ICD-10-CM

## 2018-03-03 ENCOUNTER — Ambulatory Visit (INDEPENDENT_AMBULATORY_CARE_PROVIDER_SITE_OTHER): Payer: BLUE CROSS/BLUE SHIELD | Admitting: Cardiology

## 2018-03-03 ENCOUNTER — Encounter: Payer: Self-pay | Admitting: Cardiology

## 2018-03-03 VITALS — BP 112/78 | HR 79 | Ht 61.0 in | Wt 281.6 lb

## 2018-03-03 DIAGNOSIS — I519 Heart disease, unspecified: Secondary | ICD-10-CM

## 2018-03-03 DIAGNOSIS — R9439 Abnormal result of other cardiovascular function study: Secondary | ICD-10-CM

## 2018-03-03 DIAGNOSIS — I2510 Atherosclerotic heart disease of native coronary artery without angina pectoris: Secondary | ICD-10-CM | POA: Diagnosis not present

## 2018-03-03 NOTE — Progress Notes (Signed)
Cardiology Office Note:    Date:  03/03/2018   ID:  [name], DOB [birth year], MRN [id number]  PCP:  [name], MD  Cardiologist:  Jenne Campus, MD    Referring MD: [name], MD   Chief Complaint  Patient presents with  . Follow-up  Doing well  History of Present Illness:    Beth Rodriguez is a 57 y.o. female with abnormal stress test showing small defect but she is asymptomatic therefore we continue conservative management.  She works hard she has no difficulty doing it she had to carry heavy object and no problem denies having chest pain tightness squeezing pressure been chest except for short stabbing very short lasting chest pain not related to exercise.  Past Medical History:  Diagnosis Date  . Arthritis   . Asthma   . Carpal tunnel syndrome on right   . Embolism - blood clot 2007   right leg hx of  . Enlarged heart   . GERD (gastroesophageal reflux disease)   . Hypertension   . Hypothyroidism   . Shortness of breath dyspnea    with exertion  . Sleep apnea    last sleep study over five years  . Thyroid disease    hypothyroidism    Past Surgical History:  Procedure Laterality Date  . CARPAL TUNNEL RELEASE Right   . EYE SURGERY Bilateral    cataract removal   . KNEE SURGERY  2007   right knee  . SHOULDER ARTHROSCOPY WITH OPEN ROTATOR CUFF REPAIR AND DISTAL CLAVICLE ACROMINECTOMY Right 08/09/2014   Procedure: SHOULDER ARTHROSCOPY WITH OPEN ROTATOR CUFF REPAIR AND DISTAL CLAVICLE ACROMINECTOMY;  Surgeon: Earlie Server, MD;  Location: [place];  Service: Orthopedics;  Laterality: Right;  . SINUS SURGERY WITH INSTATRAK     ???    Current Medications: Current Meds  Medication Sig  . albuterol (PROVENTIL HFA;VENTOLIN HFA) 108 (90 Base) MCG/ACT inhaler Inhale 1-2 puffs into the lungs every 4 (four) hours as needed for wheezing or shortness of breath.  Marland Kitchen albuterol (PROVENTIL) (2.5 MG/3ML) 0.083% nebulizer solution Take 3 mLs (2.5 mg total) by  nebulization every 6 (six) hours as needed for shortness of breath.  Marland Kitchen aspirin EC 81 MG tablet Take 1 tablet by mouth daily.  Marland Kitchen atorvastatin (LIPITOR) 10 MG tablet TAKE 1 TABLET (10 MG TOTAL) BY MOUTH DAILY AT 6 PM.  . budesonide-formoterol (SYMBICORT) 160-4.5 MCG/ACT inhaler Inhale 2 puffs into the lungs 2 (two) times daily. PLEASE SEE ATTACHED FOR DETAILED DIRECTIONS  . Cranberry-Vitamin C-Probiotic (AZO CRANBERRY PO) Take 2 tablets by mouth daily.  Marland Kitchen FASENRA 30 MG/ML SOSY INJECT 30 MG INTO THE SKIN EVERY 8 (EIGHT) WEEKS.  . fluticasone (FLOVENT HFA) 110 MCG/ACT inhaler INHALE TWO PUFFS TWICE DAILY. RINSE MOUTH AFTER USE.  . ibandronate (BONIVA) 150 MG tablet Take 1 tablet by mouth every 28 (twenty-eight) days.  Marland Kitchen levothyroxine (SYNTHROID, LEVOTHROID) 88 MCG tablet Take 88 mcg by mouth daily.  Marland Kitchen loratadine (CLARITIN) 10 MG tablet Take 10 mg by mouth daily as needed for allergies. Reported on 07/21/2015  . losartan (COZAAR) 100 MG tablet Take 100 mg by mouth daily.  . methocarbamol (ROBAXIN) 500 MG tablet Take 1 tablet by mouth.  . mometasone (NASONEX) 50 MCG/ACT nasal spray Use one spray in each nostril once or twice daily  . pantoprazole (PROTONIX) 40 MG tablet Take 40 mg by mouth 2 (two) times daily.     Current Facility-Administered Medications for the 03/03/18 encounter (Office Visit)  with Park Liter, MD  Medication  . Benralizumab SOSY 30 mg     Allergies:   Penicillin g; Penicillins; and Theophylline   Social History   Socioeconomic History  . Marital status: Married    Spouse name: Not on file  . Number of children: 1  . Years of education: Not on file  . Highest education level: Not on file  Occupational History  . Occupation: PRODUCTION ASSEMBLY    Employer: [employer]  Social Needs  . Financial resource strain: Not on file  . Food insecurity:    Worry: Not on file    Inability: Not on file  . Transportation needs:    Medical: Not on file     Non-medical: Not on file  Tobacco Use  . Smoking status: Never Smoker  . Smokeless tobacco: Never Used  Substance and Sexual Activity  . Alcohol use: No  . Drug use: No  . Sexual activity: Not on file  Lifestyle  . Physical activity:    Days per week: Not on file    Minutes per session: Not on file  . Stress: Not on file  Relationships  . Social connections:    Talks on phone: Not on file    Gets together: Not on file    Attends religious service: Not on file    Active member of club or organization: Not on file    Attends meetings of clubs or organizations: Not on file    Relationship status: Not on file  Other Topics Concern  . Not on file  Social History Narrative  . Not on file     Family History: The patient's family history includes Cancer in her sister; Carpal tunnel syndrome in her daughter and sister; Diabetes in her sister; Hyperlipidemia in her sister; Hypertension in her brother, daughter, mother, and sister. ROS:   Please see the history of present illness.    All 14 point review of systems negative except as described per history of present illness  EKGs/Labs/Other Studies Reviewed:      Recent Labs: 07/21/2017: ALT 12  Recent Lipid Panel    Component Value Date/Time   CHOL 137 07/21/2017 1015   TRIG 44 07/21/2017 1015   HDL 62 07/21/2017 1015   CHOLHDL 2.2 07/21/2017 1015   LDLCALC 66 07/21/2017 1015    Physical Exam:    VS:  BP 112/78   Pulse 79   Ht 5\' 1"  (1.549 m)   Wt 281 lb 9.6 oz (127.7 kg)   SpO2 97%   BMI 53.21 kg/m     Wt Readings from Last 3 Encounters:  03/03/18 281 lb 9.6 oz (127.7 kg)  11/03/17 278 lb 6.4 oz (126.3 kg)  07/21/17 271 lb 12.8 oz (123.3 kg)     GEN:  Well nourished, well developed in no acute distress HEENT: Normal NECK: No JVD; No carotid bruits LYMPHATICS: No lymphadenopathy CARDIAC: RRR, no murmurs, no rubs, no gallops RESPIRATORY:  Clear to auscultation without rales, wheezing or rhonchi  ABDOMEN:  Soft, non-tender, non-distended MUSCULOSKELETAL:  No edema; No deformity  SKIN: Warm and dry LOWER EXTREMITIES: no swelling NEUROLOGIC:  Alert and oriented x 3 PSYCHIATRIC:  Normal affect   ASSESSMENT:    1. Abnormal stress test   2. DIASTOLIC DYSFUNCTION   3. Coronary artery disease involving native coronary artery of native heart without angina pectoris    PLAN:    In order of problems listed above:  1. Abnormal stress test  asymptomatic on aspirin and statin which I will continue. 2. Dyslipidemia last cholesterol profile acceptable.  We will continue present management. 3. Diastolic dysfunction denies having a problem from that point review again in spite of her obesity she is very active and have no difficulty doing things.   Medication Adjustments/Labs and Tests Ordered: Current medicines are reviewed at length with the patient today.  Concerns regarding medicines are outlined above.  No orders of the defined types were placed in this encounter.  Medication changes: No orders of the defined types were placed in this encounter.   Signed, Park Liter, MD, [facility] 03/03/2018 11:21 AM    [place]

## 2018-03-03 NOTE — Patient Instructions (Signed)
Medication Instructions:  Your physician recommends that you continue on your current medications as directed. Please refer to the Current Medication list given to you today.  If you need a refill on your cardiac medications before your next appointment, please call your pharmacy.   Lab work: None.   If you have labs (blood work) drawn today and your tests are completely normal, you will receive your results only by: . MyChart Message (if you have MyChart) OR . A paper copy in the mail If you have any lab test that is abnormal or we need to change your treatment, we will call you to review the results.  Testing/Procedures: None.   Follow-Up: At [facility], you and your health needs are our priority.  As part of our continuing mission to provide you with exceptional heart care, we have created designated Provider Care Teams.  These Care Teams include your primary Cardiologist (physician) and Advanced Practice Providers (APPs -  Physician Assistants and Nurse Practitioners) who all work together to provide you with the care you need, when you need it. You will need a follow up appointment in 6 months.  Please call our office 2 months in advance to schedule this appointment.  You may see No primary care provider on file. or another member of our [facility] Provider Team in [place]: Brian Munley, MD . Rajan Revankar, MD  Any Other Special Instructions Will Be Listed Below (If Applicable).    

## 2018-04-03 ENCOUNTER — Other Ambulatory Visit: Payer: Self-pay | Admitting: Cardiology

## 2018-04-06 ENCOUNTER — Other Ambulatory Visit: Payer: Self-pay | Admitting: Allergy and Immunology

## 2018-04-20 ENCOUNTER — Ambulatory Visit (INDEPENDENT_AMBULATORY_CARE_PROVIDER_SITE_OTHER): Payer: BLUE CROSS/BLUE SHIELD | Admitting: *Deleted

## 2018-04-20 DIAGNOSIS — J4550 Severe persistent asthma, uncomplicated: Secondary | ICD-10-CM

## 2018-05-11 ENCOUNTER — Other Ambulatory Visit: Payer: Self-pay | Admitting: Allergy and Immunology

## 2018-05-29 ENCOUNTER — Other Ambulatory Visit: Payer: Self-pay | Admitting: *Deleted

## 2018-05-29 MED ORDER — BUDESONIDE-FORMOTEROL FUMARATE 160-4.5 MCG/ACT IN AERO: 2.0000 | INHALATION_SPRAY | Freq: Two times a day (BID) | RESPIRATORY_TRACT | 5 refills | Status: DC

## 2018-06-14 ENCOUNTER — Ambulatory Visit (INDEPENDENT_AMBULATORY_CARE_PROVIDER_SITE_OTHER): Payer: BLUE CROSS/BLUE SHIELD | Admitting: *Deleted

## 2018-06-14 DIAGNOSIS — J4550 Severe persistent asthma, uncomplicated: Secondary | ICD-10-CM | POA: Diagnosis not present

## 2018-06-15 ENCOUNTER — Ambulatory Visit: Payer: Self-pay

## 2018-06-26 ENCOUNTER — Ambulatory Visit (INDEPENDENT_AMBULATORY_CARE_PROVIDER_SITE_OTHER): Payer: BLUE CROSS/BLUE SHIELD | Admitting: Allergy and Immunology

## 2018-06-26 ENCOUNTER — Encounter: Payer: Self-pay | Admitting: Allergy and Immunology

## 2018-06-26 ENCOUNTER — Other Ambulatory Visit: Payer: Self-pay

## 2018-06-26 VITALS — BP 118/70 | HR 72 | Resp 18

## 2018-06-26 DIAGNOSIS — K219 Gastro-esophageal reflux disease without esophagitis: Secondary | ICD-10-CM | POA: Diagnosis not present

## 2018-06-26 DIAGNOSIS — J4550 Severe persistent asthma, uncomplicated: Secondary | ICD-10-CM | POA: Diagnosis not present

## 2018-06-26 DIAGNOSIS — J3089 Other allergic rhinitis: Secondary | ICD-10-CM

## 2018-06-26 NOTE — Progress Notes (Signed)
[place] [facility] [place] [facility] [place]   Follow-up Note  Referring Provider: [name], MD Primary Provider: [name], MD Date of Office Visit: 06/26/2018  Subjective:   Beth Rodriguez (DOB: [birth year]) is a 58 y.o. female who returns to the [facility] [place] on 06/26/2018 in re-evaluation of the following:  HPI: Beth Rodriguez returns to this clinic in reevaluation of her severe asthma treated with benralizumab, allergic rhinitis, history of chronic sinusitis, and history of LPR.  Her last visit to this clinic was 03 November 2017.  She still has a requirement for bronchodilator twice a day while using a large collection of anti-inflammatory agents including a combination of Symbicort and Flovent and benralizumab but she has not required a systemic steroid or an antibiotic since her last visit in this clinic.  Overall her nose is really doing quite well.  She believes that her reflux is under very good control.  She did obtain a flu vaccine this year.  Allergies as of 06/26/2018      Reactions   Penicillin G Other (See Comments)   Unknown   Penicillins Hives   Theophylline    REACTION: headaches      Medication List      albuterol (2.5 MG/3ML) 0.083% nebulizer solution Commonly known as:  Proventil Take 3 mLs (2.5 mg total) by nebulization every 6 (six) hours as needed for shortness of breath.   albuterol 108 (90 Base) MCG/ACT inhaler Commonly known as:  PROVENTIL HFA;VENTOLIN HFA INHALE 1-2 PUFFS INTO THE LUNGS EVERY 4 (FOUR) HOURS AS NEEDED FOR WHEEZING OR SHORTNESS OF BREATH.   aspirin EC 81 MG tablet Take 1 tablet by mouth daily.   atorvastatin 10 MG tablet Commonly known as:  LIPITOR TAKE 1 TABLET (10 MG TOTAL) BY MOUTH DAILY AT 6 PM.   AZO CRANBERRY PO Take 2 tablets by mouth daily.   budesonide-formoterol 160-4.5 MCG/ACT inhaler Commonly known as:  Symbicort Inhale 2 puffs into the lungs 2 (two) times daily. PLEASE SEE  ATTACHED FOR DETAILED DIRECTIONS   Fasenra 30 MG/ML Sosy Generic drug:  Benralizumab INJECT 30 MG INTO THE SKIN EVERY 8 (EIGHT) WEEKS.   fluticasone 110 MCG/ACT inhaler Commonly known as:  Flovent HFA INHALE TWO PUFFS TWICE DAILY. RINSE MOUTH AFTER USE.   ibandronate 150 MG tablet Commonly known as:  BONIVA Take 1 tablet by mouth every 28 (twenty-eight) days.   levothyroxine 88 MCG tablet Commonly known as:  SYNTHROID, LEVOTHROID Take 88 mcg by mouth daily.   loratadine 10 MG tablet Commonly known as:  CLARITIN Take 10 mg by mouth daily as needed for allergies. Reported on 07/21/2015   losartan 100 MG tablet Commonly known as:  COZAAR Take 100 mg by mouth daily.   mometasone 50 MCG/ACT nasal spray Commonly known as:  Nasonex Use one spray in each nostril once or twice daily   pantoprazole 40 MG tablet Commonly known as:  PROTONIX Take 40 mg by mouth 2 (two) times daily.       Past Medical History:  Diagnosis Date  . Arthritis   . Asthma   . Carpal tunnel syndrome on right   . Embolism - blood clot 2007   right leg hx of  . Enlarged heart   . GERD (gastroesophageal reflux disease)   . Hypertension   . Hypothyroidism   . Shortness of breath dyspnea    with exertion  . Sleep apnea    last sleep study  over five years  . Thyroid disease    hypothyroidism    Past Surgical History:  Procedure Laterality Date  . CARPAL TUNNEL RELEASE Right   . EYE SURGERY Bilateral    cataract removal   . KNEE SURGERY  2007   right knee  . SHOULDER ARTHROSCOPY WITH OPEN ROTATOR CUFF REPAIR AND DISTAL CLAVICLE ACROMINECTOMY Right 08/09/2014   Procedure: SHOULDER ARTHROSCOPY WITH OPEN ROTATOR CUFF REPAIR AND DISTAL CLAVICLE ACROMINECTOMY;  Surgeon: Earlie Server, MD;  Location: [facility];  Service: Orthopedics;  Laterality: Right;  . SINUS SURGERY WITH INSTATRAK     ???    Review of systems negative except as noted in HPI / PMHx or noted below:  Review of Systems   Constitutional: Negative.   HENT: Negative.   Eyes: Negative.   Respiratory: Negative.   Cardiovascular: Negative.   Gastrointestinal: Negative.   Genitourinary: Negative.   Musculoskeletal: Negative.   Skin: Negative.   Neurological: Negative.   Endo/Heme/Allergies: Negative.   Psychiatric/Behavioral: Negative.      Objective:   Vitals:   06/26/18 1536  BP: 118/70  Pulse: 72  Resp: 18  SpO2: 94%          Physical Exam Constitutional:      Appearance: She is not diaphoretic.  HENT:     Head: Normocephalic.     Right Ear: Tympanic membrane, ear canal and external ear normal.     Left Ear: Tympanic membrane, ear canal and external ear normal.     Nose: Nose normal. No mucosal edema or rhinorrhea.     Mouth/Throat:     Pharynx: Uvula midline. No oropharyngeal exudate.  Eyes:     Conjunctiva/sclera: Conjunctivae normal.  Neck:     Thyroid: No thyromegaly.     Trachea: Trachea normal. No tracheal tenderness or tracheal deviation.  Cardiovascular:     Rate and Rhythm: Normal rate and regular rhythm.     Heart sounds: Normal heart sounds, S1 normal and S2 normal. No murmur.  Pulmonary:     Effort: No respiratory distress.     Breath sounds: Normal breath sounds. No stridor. No wheezing or rales.  Lymphadenopathy:     Head:     Right side of head: No tonsillar adenopathy.     Left side of head: No tonsillar adenopathy.     Cervical: No cervical adenopathy.  Skin:    Findings: No erythema or rash.     Nails: There is no clubbing.   Neurological:     Mental Status: She is alert.     Diagnostics:    Spirometry was performed and demonstrated an FEV1 of 1.44 at 64 % of predicted.  The patient had an Asthma Control Test with the following results: ACT Total Score: 20.    Assessment and Plan:   1. Asthma, severe persistent, well-controlled   2. Other allergic rhinitis   3. LPRD (laryngopharyngeal reflux disease)     1. Continue Symbicort 160 two puffs two  times per day    2. Add Flovent 110 2 inhalations two times per day during increased asthma activity  3. Continue pantoprazole 40mg  twice a day  4. Continue Benralizumab (AUVI-Q 3.0 )   5. Continue nasonex one spray each nostril 1-2 times per day.   6. Continue Proair HFA or Albuterol nebulization if needed.  7. Can add OTC antihistamine - Zyrtec / Allegra / Claritin, and nasal saline  8. Return to clinic in 6 months or earlier if problem  [place]ene is stable with stability defined by a daily requirement for bronchodilator but no exacerbations over the course of the past 9 months.  She will continue to use benralizumab and Symbicort on a regular basis and add in Flovent at points in time when she has increased asthma activity and also continue to use a proton pump inhibitor twice a day.  I will see her back in this clinic in 6 months or earlier if there is a problem.  Allena Katz, MD Allergy / Immunology [place]

## 2018-06-26 NOTE — Patient Instructions (Signed)
  1. Continue Symbicort 160 two puffs two times per day    2. Add Flovent 110 2 inhalations two times per day during increased asthma activity  3. Continue pantoprazole 40mg  twice a day  4. Continue Benralizumab (AUVI-Q 3.0 )   5. Continue nasonex one spray each nostril 1-2 times per day.   6. Continue Proair HFA or Albuterol nebulization if needed.  7. Can add OTC antihistamine - Zyrtec / Allegra / Claritin, and nasal saline  8. Return to clinic in 6 months or earlier if problem

## 2018-06-27 ENCOUNTER — Encounter: Payer: Self-pay | Admitting: Allergy and Immunology

## 2018-07-01 ENCOUNTER — Other Ambulatory Visit: Payer: Self-pay | Admitting: Cardiology

## 2018-08-09 ENCOUNTER — Ambulatory Visit (INDEPENDENT_AMBULATORY_CARE_PROVIDER_SITE_OTHER): Payer: BLUE CROSS/BLUE SHIELD | Admitting: *Deleted

## 2018-08-09 ENCOUNTER — Other Ambulatory Visit: Payer: Self-pay

## 2018-08-09 DIAGNOSIS — J4550 Severe persistent asthma, uncomplicated: Secondary | ICD-10-CM | POA: Diagnosis not present

## 2018-09-05 ENCOUNTER — Other Ambulatory Visit: Payer: Self-pay | Admitting: Allergy and Immunology

## 2018-09-05 ENCOUNTER — Other Ambulatory Visit: Payer: Self-pay

## 2018-09-05 MED ORDER — ALBUTEROL SULFATE HFA 108 (90 BASE) MCG/ACT IN AERS: 1.0000 | INHALATION_SPRAY | RESPIRATORY_TRACT | 1 refills | Status: DC | PRN

## 2018-09-06 ENCOUNTER — Telehealth: Payer: Self-pay

## 2018-09-06 ENCOUNTER — Other Ambulatory Visit: Payer: Self-pay | Admitting: Allergy and Immunology

## 2018-09-06 MED ORDER — ALBUTEROL SULFATE HFA 108 (90 BASE) MCG/ACT IN AERS: 2.0000 | INHALATION_SPRAY | RESPIRATORY_TRACT | 1 refills | Status: DC | PRN

## 2018-09-08 ENCOUNTER — Other Ambulatory Visit: Payer: Self-pay | Admitting: Allergy and Immunology

## 2018-09-08 MED ORDER — ALBUTEROL SULFATE HFA 108 (90 BASE) MCG/ACT IN AERS: 2.0000 | INHALATION_SPRAY | RESPIRATORY_TRACT | 1 refills | Status: DC | PRN

## 2018-09-08 NOTE — Telephone Encounter (Signed)
Script sent into pharmacy 

## 2018-09-08 NOTE — Telephone Encounter (Signed)
Beth Rodriguez would like VENTOLIN sent to [facility] in [place].

## 2018-10-04 ENCOUNTER — Ambulatory Visit (INDEPENDENT_AMBULATORY_CARE_PROVIDER_SITE_OTHER): Payer: BC Managed Care – PPO | Admitting: *Deleted

## 2018-10-04 ENCOUNTER — Other Ambulatory Visit: Payer: Self-pay

## 2018-10-04 DIAGNOSIS — J4550 Severe persistent asthma, uncomplicated: Secondary | ICD-10-CM

## 2018-11-29 ENCOUNTER — Ambulatory Visit: Payer: BC Managed Care – PPO

## 2018-11-30 ENCOUNTER — Ambulatory Visit (INDEPENDENT_AMBULATORY_CARE_PROVIDER_SITE_OTHER): Payer: BC Managed Care – PPO | Admitting: *Deleted

## 2018-11-30 ENCOUNTER — Other Ambulatory Visit: Payer: Self-pay

## 2018-11-30 DIAGNOSIS — J4550 Severe persistent asthma, uncomplicated: Secondary | ICD-10-CM | POA: Diagnosis not present

## 2018-12-07 ENCOUNTER — Ambulatory Visit: Payer: BC Managed Care – PPO

## 2018-12-14 ENCOUNTER — Ambulatory Visit (INDEPENDENT_AMBULATORY_CARE_PROVIDER_SITE_OTHER): Payer: BC Managed Care – PPO | Admitting: Allergy and Immunology

## 2018-12-14 ENCOUNTER — Other Ambulatory Visit: Payer: Self-pay

## 2018-12-14 ENCOUNTER — Encounter: Payer: Self-pay | Admitting: Allergy and Immunology

## 2018-12-14 VITALS — BP 124/72 | HR 84 | Temp 97.5°F | Resp 18

## 2018-12-14 DIAGNOSIS — J0140 Acute pansinusitis, unspecified: Secondary | ICD-10-CM

## 2018-12-14 DIAGNOSIS — K219 Gastro-esophageal reflux disease without esophagitis: Secondary | ICD-10-CM | POA: Diagnosis not present

## 2018-12-14 DIAGNOSIS — J3089 Other allergic rhinitis: Secondary | ICD-10-CM

## 2018-12-14 DIAGNOSIS — J4550 Severe persistent asthma, uncomplicated: Secondary | ICD-10-CM

## 2018-12-14 DIAGNOSIS — D171 Benign lipomatous neoplasm of skin and subcutaneous tissue of trunk: Secondary | ICD-10-CM

## 2018-12-14 MED ORDER — AZITHROMYCIN 500 MG PO TABS: ORAL_TABLET | ORAL | 0 refills | Status: DC

## 2018-12-14 NOTE — Progress Notes (Signed)
[place] [facility] [place] [facility] [place]   Follow-up Note  Referring Provider: [name], MD Primary Provider: [name], MD Date of Office Visit: 12/14/2018  Subjective:   Beth Rodriguez (DOB: 1960/05/21) is a 58 y.o. female who returns to the [facility] and [place] on 12/14/2018 in re-evaluation of the following:  HPI: Calvin returns to this clinic in reevaluation of severe asthma treated with benralizumab, allergic rhinitis, history of chronic sinusitis and a history of LPR.  Her last visit to this clinic was 26 June 2018.  During the interval she has really done relatively well on a large collection of medical therapy which includes a combination of Symbicort twice a day and Flovent once a day and benralizumab injections.  It does not sound as though she has required a systemic steroid to treat an exacerbation of asthma.  Likewise, her nose was doing relatively well while using Nasonex.  And her reflux was under very good control on a proton pump inhibitor.  Unfortunately, earlier this week she developed sneezing and nasal congestion and has developed a little bit of a cough.  Some of the material emanating from her nose is yellow.  She has not had any anosmia or fever or constitutional symptoms.  She has not had an obvious change in her environment to account for this issue.  Allergies as of 12/14/2018      Reactions   Penicillin G Other (See Comments)   Unknown   Penicillins Hives   Theophylline    REACTION: headaches      Medication List      albuterol (2.5 MG/3ML) 0.083% nebulizer solution Commonly known as: Proventil Take 3 mLs (2.5 mg total) by nebulization every 6 (six) hours as needed for shortness of breath.   albuterol 108 (90 Base) MCG/ACT inhaler Commonly known as: VENTOLIN HFA Inhale 2 puffs into the lungs every 4 (four) hours as needed.   aspirin EC 81 MG tablet Take 1 tablet by mouth daily.   atorvastatin 10 MG tablet  Commonly known as: LIPITOR TAKE 1 TABLET (10 MG TOTAL) BY MOUTH DAILY AT 6 PM.   AZO CRANBERRY PO Take 2 tablets by mouth daily.   budesonide-formoterol 160-4.5 MCG/ACT inhaler Commonly known as: Symbicort Inhale 2 puffs into the lungs 2 (two) times daily. PLEASE SEE ATTACHED FOR DETAILED DIRECTIONS   Fasenra 30 MG/ML Sosy Generic drug: Benralizumab INJECT 30 MG INTO THE SKIN EVERY 8 WEEKS.   fluticasone 110 MCG/ACT inhaler Commonly known as: Flovent HFA INHALE TWO PUFFS TWICE DAILY. RINSE MOUTH AFTER USE.   ibandronate 150 MG tablet Commonly known as: BONIVA Take 1 tablet by mouth every 28 (twenty-eight) days.   levalbuterol 45 MCG/ACT inhaler Commonly known as: XOPENEX HFA Please specify directions, refills and quantity   levothyroxine 88 MCG tablet Commonly known as: SYNTHROID Take 88 mcg by mouth daily.   loratadine 10 MG tablet Commonly known as: CLARITIN Take 10 mg by mouth daily as needed for allergies. Reported on 07/21/2015   losartan 100 MG tablet Commonly known as: COZAAR Take 100 mg by mouth daily.   mometasone 50 MCG/ACT nasal spray Commonly known as: Nasonex Use one spray in each nostril once or twice daily   pantoprazole 40 MG tablet Commonly known as: PROTONIX Take 40 mg by mouth 2 (two) times daily.       Past Medical History:  Diagnosis Date  . Arthritis   . Asthma   . Carpal tunnel  syndrome on right   . Embolism - blood clot 2007   right leg hx of  . Enlarged heart   . GERD (gastroesophageal reflux disease)   . Hypertension   . Hypothyroidism   . Shortness of breath dyspnea    with exertion  . Sleep apnea    last sleep study over five years  . Thyroid disease    hypothyroidism    Past Surgical History:  Procedure Laterality Date  . CARPAL TUNNEL RELEASE Right   . EYE SURGERY Bilateral    cataract removal   . KNEE SURGERY  2007   right knee  . SHOULDER ARTHROSCOPY WITH OPEN ROTATOR CUFF REPAIR AND DISTAL CLAVICLE  ACROMINECTOMY Right 08/09/2014   Procedure: SHOULDER ARTHROSCOPY WITH OPEN ROTATOR CUFF REPAIR AND DISTAL CLAVICLE ACROMINECTOMY;  Surgeon: Earlie Server, MD;  Location: [facility];  Service: Orthopedics;  Laterality: Right;  . SINUS SURGERY WITH INSTATRAK     ???    Review of systems negative except as noted in HPI / PMHx or noted below:  Review of Systems  Constitutional: Negative.   HENT: Negative.   Eyes: Negative.   Respiratory: Negative.   Cardiovascular: Negative.   Gastrointestinal: Negative.   Genitourinary: Negative.   Musculoskeletal: Negative.   Skin: Negative.   Neurological: Negative.   Endo/Heme/Allergies: Negative.   Psychiatric/Behavioral: Negative.      Objective:   Vitals:   12/14/18 1646  BP: 124/72  Pulse: 84  Resp: 18  Temp: (!) 97.5 F (36.4 C)  SpO2: 95%          Physical Exam Constitutional:      Appearance: She is not diaphoretic.  HENT:     Head: Normocephalic.     Right Ear: Tympanic membrane, ear canal and external ear normal.     Left Ear: Tympanic membrane, ear canal and external ear normal.     Nose: Mucosal edema present. No rhinorrhea.     Mouth/Throat:     Pharynx: Uvula midline. No oropharyngeal exudate.  Eyes:     Conjunctiva/sclera: Conjunctivae normal.  Neck:     Thyroid: No thyromegaly.     Trachea: Trachea normal. No tracheal tenderness or tracheal deviation.  Cardiovascular:     Rate and Rhythm: Normal rate and regular rhythm.     Heart sounds: Normal heart sounds, S1 normal and S2 normal. No murmur.  Pulmonary:     Effort: No respiratory distress.     Breath sounds: Normal breath sounds. No stridor. No wheezing or rales.  Lymphadenopathy:     Head:     Right side of head: No tonsillar adenopathy.     Left side of head: No tonsillar adenopathy.     Cervical: No cervical adenopathy.  Skin:    Findings: No erythema or rash.     Nails: There is no clubbing.      Comments: Right upper back lipoma  Neurological:      Mental Status: She is alert.     Diagnostics:    Spirometry was performed and demonstrated an FEV1 of 2.04 at 88 % of predicted.  Assessment and Plan:   1. Asthma, severe persistent, well-controlled   2. Other allergic rhinitis   3. Acute non-recurrent pansinusitis   4. LPRD (laryngopharyngeal reflux disease)   5. Lipoma of torso     1. Continue Symbicort 160 two puffs two times per day    2. Add Flovent 110 2 inhalations two times per day during increased asthma activity  3. Continue pantoprazole 40mg  twice a day  4. Continue Benralizumab (AUVI-Q 3.0 )   5. Continue nasonex one spray each nostril 1-2 times per day.   6. Continue Proair HFA or Albuterol nebulization if needed.  7. Can add OTC antihistamine - Zyrtec / Allegra / Claritin, and nasal saline  8. For this recent episode:   A. Azithromycin 500 mg - 1 tablet 1 time per day for 3 days  B. Prednisone 10 mg - 1 tablet 1 time per day for 10 days  9. Take care of lipoma  10. Obtain fall flu vaccine (and COVID vaccine)  11. Return to clinic in 6 months or earlier if problem  I will assume that Delsy has a respiratory tract infection giving rise to her respiratory tract symptoms.  Because of her severe asthma we do not want her to develop significant inflammation of her lower airway as a result of this infection and thus I am going to give her a broad-spectrum antibiotic and a relatively low dose of a systemic steroid for the next 10 days.  Assuming she does well with this plan she will maintain anti-inflammatory therapy for her airway as noted above and I will see her back in this clinic in 6 months or earlier if there is a problem.  She also appears to have a lipoma and she can discuss with her primary care doctor about disposition for this issue.  Allena Katz, MD Allergy / Immunology [facility]

## 2018-12-14 NOTE — Patient Instructions (Addendum)
  1. Continue Symbicort 160 two puffs two times per day    2. Add Flovent 110 2 inhalations two times per day during increased asthma activity  3. Continue pantoprazole 40mg  twice a day  4. Continue Benralizumab (AUVI-Q 3.0 )   5. Continue nasonex one spray each nostril 1-2 times per day.   6. Continue Proair HFA or Albuterol nebulization if needed.  7. Can add OTC antihistamine - Zyrtec / Allegra / Claritin, and nasal saline  8. For this recent episode:   A. Azithromycin 500 mg - 1 tablet 1 time per day for 3 days  B. Prednisone 10 mg - 1 tablet 1 time per day for 10 days  9. Take care of lipoma  10. Obtain fall flu vaccine (and COVID vaccine)  11. Return to clinic in 6 months or earlier if problem

## 2018-12-15 MED ORDER — AZITHROMYCIN 500 MG PO TABS: ORAL_TABLET | ORAL | 0 refills | Status: DC

## 2018-12-19 ENCOUNTER — Encounter: Payer: Self-pay | Admitting: Allergy and Immunology

## 2018-12-29 ENCOUNTER — Other Ambulatory Visit: Payer: Self-pay | Admitting: Cardiology

## 2019-01-02 ENCOUNTER — Encounter: Payer: Self-pay | Admitting: Allergy and Immunology

## 2019-01-02 ENCOUNTER — Ambulatory Visit (INDEPENDENT_AMBULATORY_CARE_PROVIDER_SITE_OTHER): Payer: BC Managed Care – PPO | Admitting: Allergy and Immunology

## 2019-01-02 ENCOUNTER — Other Ambulatory Visit: Payer: Self-pay

## 2019-01-02 VITALS — BP 152/96 | HR 71 | Temp 97.7°F | Resp 16 | Ht 61.0 in | Wt 288.8 lb

## 2019-01-02 DIAGNOSIS — K219 Gastro-esophageal reflux disease without esophagitis: Secondary | ICD-10-CM | POA: Diagnosis not present

## 2019-01-02 DIAGNOSIS — J3089 Other allergic rhinitis: Secondary | ICD-10-CM | POA: Diagnosis not present

## 2019-01-02 DIAGNOSIS — J4551 Severe persistent asthma with (acute) exacerbation: Secondary | ICD-10-CM

## 2019-01-02 MED ORDER — METHYLPREDNISOLONE ACETATE 80 MG/ML IJ SUSP
80.0000 mg | Freq: Once | INTRAMUSCULAR | Status: AC
  Administered 2019-01-02: 80 mg via INTRAMUSCULAR

## 2019-01-02 NOTE — Patient Instructions (Addendum)
  1. Continue Symbicort 160 two puffs two times per day    2. Add Flovent 110 2 inhalations two times per day during increased asthma activity  3. Continue pantoprazole 40mg  twice a day  4. Continue Benralizumab (AUVI-Q 3.0 )   5. Continue nasonex one spray each nostril 1-2 times per day.   6. Continue Proair HFA or Albuterol nebulization if needed.  7. Can add OTC antihistamine - Zyrtec / Allegra / Claritin, and nasal saline  8. For this recent episode:   A.  Add flovent at 2 inhalations 2 times per day  B.  Continue Symbicort  C.  Depo-Medrol 80 IM delivered in clinic today  D.  Prednisone 10 mg -2 tabs now, 2 tabs this evening, 1 tab daily for 10 days  9.  Obtain fall flu vaccine (and COVID vaccine)  10. Return to clinic in 6 months or earlier if problem

## 2019-01-02 NOTE — Progress Notes (Signed)
[facility] [place] [facility] [place]   Follow-up Note  Referring Provider: [name], MD Primary Provider: [name], MD Date of Office Visit: 01/02/2019  Subjective:   Beth Rodriguez (DOB: [birth year]) is a 58 y.o. female who returns to the [facility] on 01/02/2019 in re-evaluation of the following:  HPI: Beth Rodriguez returns to this clinic in evaluation of severe asthma, allergic rhinitis, history of chronic sinusitis, history of LPR.  Her last visit to this clinic was 14 December 2018 at which point in time it appeared that she had a infectious respiratory tract flare for which we gave her a short course of systemic steroids and a broad-spectrum antibiotic.  She did very well with that therapy and basically resolved her enhanced respiratory tract symptoms and continued to do well while using Symbicort and Flovent and benralizumab.  She had very little issues with her nose and no issues with her reflux and no issues with a requirement to use a bronchodilator greater than 3 times per week.  She had a water leak in her house and the wall was opened up 3 days ago and there was lots of mold and she had exposure to the mold and ever since then she has had some wheezing and some slight cough and lots of shortness of breath.  She has use her bronchodilator multiple times a day.  She has not had any fever or nasal symptoms or throat symptoms or reflux symptoms or chest pain or swelling of her legs.  The water leak and mold issue has been addressed and the wall will be closed up today.  Allergies as of 01/02/2019      Reactions   Penicillin G Other (See Comments)   Unknown   Penicillins Hives   Theophylline    REACTION: headaches      Medication List      albuterol (2.5 MG/3ML) 0.083% nebulizer solution Commonly known as: Proventil Take 3 mLs (2.5 mg total) by nebulization every 6 (six) hours as needed for shortness of breath.   albuterol 108 (90  Base) MCG/ACT inhaler Commonly known as: VENTOLIN HFA Inhale 2 puffs into the lungs every 4 (four) hours as needed.   aspirin EC 81 MG tablet Take 1 tablet by mouth daily.   atorvastatin 10 MG tablet Commonly known as: LIPITOR TAKE 1 TABLET BY MOUTH AT 6 PM    AZO CRANBERRY PO Take 2 tablets by mouth daily.   budesonide-formoterol 160-4.5 MCG/ACT inhaler Commonly known as: Symbicort Inhale 2 puffs into the lungs 2 (two) times daily. PLEASE SEE ATTACHED FOR DETAILED DIRECTIONS   Fasenra 30 MG/ML Sosy Generic drug: Benralizumab INJECT 30 MG INTO THE SKIN EVERY 8 WEEKS.   fluticasone 110 MCG/ACT inhaler Commonly known as: Flovent HFA INHALE TWO PUFFS TWICE DAILY. RINSE MOUTH AFTER USE.   ibandronate 150 MG tablet Commonly known as: BONIVA Take 1 tablet by mouth every 28 (twenty-eight) days.   levalbuterol 45 MCG/ACT inhaler Commonly known as: XOPENEX HFA Please specify directions, refills and quantity   levothyroxine 88 MCG tablet Commonly known as: SYNTHROID Take 88 mcg by mouth daily.   loratadine 10 MG tablet Commonly known as: CLARITIN Take 10 mg by mouth daily as needed for allergies. Reported on 07/21/2015   losartan 100 MG tablet Commonly known as: COZAAR Take 100 mg by mouth daily.   mometasone 50 MCG/ACT nasal spray Commonly known as: Nasonex Use one spray in each nostril once  or twice daily   pantoprazole 40 MG tablet Commonly known as: PROTONIX Take 40 mg by mouth 2 (two) times daily.       Past Medical History:  Diagnosis Date   Arthritis    Asthma    Carpal tunnel syndrome on right    Embolism - blood clot 2007   right leg hx of   Enlarged heart    GERD (gastroesophageal reflux disease)    Hypertension    Hypothyroidism    Shortness of breath dyspnea    with exertion   Sleep apnea    last sleep study over five years   Thyroid disease    hypothyroidism    Past Surgical History:  Procedure Laterality Date   CARPAL TUNNEL  RELEASE Right    EYE SURGERY Bilateral    cataract removal    KNEE SURGERY  2007   right knee   SHOULDER ARTHROSCOPY WITH OPEN ROTATOR CUFF REPAIR AND DISTAL CLAVICLE ACROMINECTOMY Right 08/09/2014   Procedure: SHOULDER ARTHROSCOPY WITH OPEN ROTATOR CUFF REPAIR AND DISTAL CLAVICLE ACROMINECTOMY;  Surgeon: Earlie Server, MD;  Location: [facility];  Service: Orthopedics;  Laterality: Right;   SINUS SURGERY WITH INSTATRAK     ???    Review of systems negative except as noted in HPI / PMHx or noted below:  Review of Systems  Constitutional: Negative.   HENT: Negative.   Eyes: Negative.   Respiratory: Negative.   Cardiovascular: Negative.   Gastrointestinal: Negative.   Genitourinary: Negative.   Musculoskeletal: Negative.   Skin: Negative.   Neurological: Negative.   Endo/Heme/Allergies: Negative.   Psychiatric/Behavioral: Negative.      Objective:   Vitals:   01/02/19 1051  BP: (!) 152/96  Pulse: 71  Resp: 16  Temp: 97.7 F (36.5 C)  SpO2: 94%   Height: 5\' 1"  (154.9 cm)  Weight: 288 lb 12.8 oz (131 kg)   Physical Exam Constitutional:      Appearance: She is not diaphoretic.  HENT:     Head: Normocephalic.     Right Ear: Tympanic membrane, ear canal and external ear normal.     Left Ear: Tympanic membrane, ear canal and external ear normal.     Nose: Nose normal. No mucosal edema or rhinorrhea.     Mouth/Throat:     Pharynx: Uvula midline. No oropharyngeal exudate.  Eyes:     Conjunctiva/sclera: Conjunctivae normal.  Neck:     Thyroid: No thyromegaly.     Trachea: Trachea normal. No tracheal tenderness or tracheal deviation.  Cardiovascular:     Rate and Rhythm: Normal rate and regular rhythm.     Heart sounds: Normal heart sounds, S1 normal and S2 normal. No murmur.  Pulmonary:     Effort: No respiratory distress.     Breath sounds: No stridor. Wheezing (Expiratory wheezes anterior chest bilaterally) present. No rales.  Lymphadenopathy:     Head:      Right side of head: No tonsillar adenopathy.     Left side of head: No tonsillar adenopathy.     Cervical: No cervical adenopathy.  Skin:    Findings: No erythema or rash.     Nails: There is no clubbing.   Neurological:     Mental Status: She is alert.     Diagnostics:    Spirometry was performed and demonstrated an FEV1 of 1.09 at 47 % of predicted.  Her previous FEV1 was 2.04  The patient had an Asthma Control Test with the following results:  .  Assessment and Plan:   1. Asthma, not well controlled, severe persistent, with acute exacerbation   2. Other allergic rhinitis   3. LPRD (laryngopharyngeal reflux disease)     1. Continue Symbicort 160 two puffs two times per day    2. Add Flovent 110 2 inhalations two times per day during increased asthma activity  3. Continue pantoprazole 40mg  twice a day  4. Continue Benralizumab (AUVI-Q 3.0 )   5. Continue nasonex one spray each nostril 1-2 times per day.   6. Continue Proair HFA or Albuterol nebulization if needed.  7. Can add OTC antihistamine - Zyrtec / Allegra / Claritin, and nasal saline  8. For this recent episode:   A.  Add flovent at 2 inhalations 2 times per day  B.  Continue Symbicort  C.  Depo-Medrol 80 IM delivered in clinic today  D.  Prednisone 10 mg -2 tabs now, 2 tabs this evening, 1 tab daily for 10 days  9.  Obtain fall flu vaccine (and COVID vaccine)  10. Return to clinic in 6 months or earlier if problem  Beth Rodriguez has had an exacerbation of her asthma secondary to mold exposure and we will treat her with the combination of therapy noted above which does include the administration of systemic steroids while she continues on a large collection of anti-inflammatory agents delivered to her airway.  I will see her back in this clinic in 6 months or earlier if there is a problem.  Allena Katz, MD [facility] / [facility] [place]

## 2019-01-03 ENCOUNTER — Encounter: Payer: Self-pay | Admitting: Allergy and Immunology

## 2019-01-23 ENCOUNTER — Other Ambulatory Visit: Payer: Self-pay | Admitting: Cardiology

## 2019-01-25 ENCOUNTER — Other Ambulatory Visit: Payer: Self-pay

## 2019-01-25 ENCOUNTER — Ambulatory Visit (INDEPENDENT_AMBULATORY_CARE_PROVIDER_SITE_OTHER): Payer: BC Managed Care – PPO | Admitting: *Deleted

## 2019-01-25 DIAGNOSIS — J4550 Severe persistent asthma, uncomplicated: Secondary | ICD-10-CM | POA: Diagnosis not present

## 2019-02-02 ENCOUNTER — Ambulatory Visit: Payer: Self-pay | Admitting: Surgery

## 2019-02-02 NOTE — H&P (Signed)
History of Present Illness Beth Rodriguez. Kiowa Hollar MD; 02/02/2019 12:39 PM) The patient is a 58 year old female who presents with a complaint of Mass. Referred by Artemio Aly, NP for her lipoma of the posterior right shoulder This is a 58 year old female who presents with a 30 year history of an enlarging mass on her posterior right shoulder over her trapezius. This has become quite large and is causing significant tenderness. This area has never been infected. Due to the enlarging size and the pain, she would like to have this area removed. 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   Problem List/Past Medical Rodman Key K. 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 Knee Surgery Right. Shoulder Surgery Right.  Diagnostic Studies History Mammie Lorenzo, LPN; 075-GRM [id number] AM) Colonoscopy 1-5 years ago Mammogram 1-3 years ago Pap Smear 1-5 years ago  Allergies Mammie Lorenzo, LPN; 075-GRM 075-GRM AM) Penicillins Hives. Theophylline ER *ANTIASTHMATIC AND BRONCHODILATOR AGENTS* headaches  Medication History Mammie Lorenzo, LPN; 075-GRM [id number] AM) Albuterol Sulfate HFA (108 (90 Base)MCG/ACT Aerosol Soln, Inhalation) Active. Atorvastatin Calcium (10MG  Tablet, Oral) Active. Flovent HFA (110MCG/ACT Aerosol, Inhalation) Active. Levothyroxine Sodium (88MCG Tablet, Oral) Active. Losartan Potassium (100MG  Tablet, Oral) Active. Symbicort (160-4.5MCG/ACT Aerosol, Inhalation) Active. Medications Reconciled  Social History Mammie Lorenzo, LPN; 075-GRM [id number] AM) Caffeine use Carbonated beverages, Coffee, Tea. No alcohol use No drug use Tobacco use Never smoker.  Family History Mammie Lorenzo, LPN; 075-GRM [id number] AM) Heart Disease Mother. Heart disease in female family member before age 36 Heart  disease in female family member before age 76 Hypertension Father. Melanoma Sister.  Pregnancy / Birth History Mammie Lorenzo, LPN; 075-GRM [id number] AM) Age at menarche 60 years. Age of menopause 60-50 Contraceptive History Oral contraceptives. Gravida 1 Maternal age 76-20 Para 1  Other Problems Beth Rodriguez. Alexxis Mackert, MD; 02/02/2019 12:39 PM) Arthritis Asthma Diverticulosis Gastroesophageal Reflux Disease High blood pressure Hypercholesterolemia Pulmonary Embolism / Blood Clot in Legs Sleep Apnea Thyroid Disease     Review of Systems Claiborne Billings [facility] LPN; 075-GRM [id number] AM) General Not Present- Appetite Loss, Chills, Fatigue, Fever, Night Sweats, Weight Gain and Weight Loss. Skin Not Present- Change in Wart/Mole, Dryness, Hives, Jaundice, New Lesions, Non-Healing Wounds, Rash and Ulcer. HEENT Present- Wears glasses/contact lenses. Not Present- Earache, Hearing Loss, Hoarseness, Nose Bleed, Oral Ulcers, Ringing in the Ears, Seasonal Allergies, Sinus Pain, Sore Throat, Visual Disturbances and Yellow Eyes. Respiratory Not Present- Bloody sputum, Chronic Cough, Difficulty Breathing, Snoring and Wheezing. Breast Not Present- Breast Mass, Breast Pain, Nipple Discharge and Skin Changes. Cardiovascular Not Present- Chest Pain, Difficulty Breathing Lying Down, Leg Cramps, Palpitations, Rapid Heart Rate, Shortness of Breath and Swelling of Extremities. Gastrointestinal Not Present- Abdominal Pain, Bloating, Bloody Stool, Change in Bowel Habits, Chronic diarrhea, Constipation, Difficulty Swallowing, Excessive gas, Gets full quickly at meals, Hemorrhoids, Indigestion, Nausea, Rectal Pain and Vomiting. Female Genitourinary Not Present- Frequency, Nocturia, Painful Urination, Pelvic Pain and Urgency. Musculoskeletal Not Present- Back Pain, Joint Pain, Joint Stiffness, Muscle Pain, Muscle Weakness and Swelling of Extremities. Neurological Not Present- Decreased Memory, Fainting,  Headaches, Numbness, Seizures, Tingling, Tremor, Trouble walking and Weakness. Psychiatric Not Present- Anxiety, Bipolar, Change in Sleep Pattern, Depression, Fearful and Frequent crying. Endocrine Not Present- Cold Intolerance, Excessive Hunger, Hair Changes, Heat Intolerance, Hot flashes and New Diabetes. Hematology Present- Easy Bruising. Not Present- Blood Thinners, Excessive bleeding, Gland problems, HIV and  Persistent Infections.  Vitals Claiborne Billings Dockery LPN; 075-GRM [id number] AM) 02/02/2019 11:26 AM Weight: 285.2 lb Height: 62in Body Surface Area: 2.22 m Body Mass Index: 52.16 kg/m  Pulse: 86 (Regular)  BP: 124/72 (Sitting, Left Arm, Standard)        Physical Exam Rodman Key K. Kenton Fortin MD; 02/02/2019 12:40 PM)  The physical exam findings are as follows: Note:WDWN in NAD Eyes: Pupils equal, round; sclera anicteric HENT: Oral mucosa moist; good dentition Neck: No masses palpated, no thyromegaly Posterior right shoulder over trapezius - large protruding 15 cm soft tissue mass - no overlying skin changes; smooth, mobile, firm Lungs: CTA bilaterally; normal respiratory effort CV: Regular rate and rhythm; no murmurs; extremities well-perfused with no edema Abd: +bowel sounds, soft, non-tender, no palpable organomegaly; no palpable hernias Skin: Warm, dry; no sign of jaundice Psychiatric - alert and oriented x 4; calm mood and affect    Assessment & Plan Rodman Key K. 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 The surgical procedure has been discussed with the patient. Potential risks, benefits, alternative treatments, and expected outcomes have been explained. All of the patient's questions at this time have been answered. The likelihood of reaching the patient's treatment goal is good. The patient understand the  proposed surgical procedure and wishes to proceed.  Beth Rodriguez. Georgette Dover, MD, [facility] Surgery  General/ Trauma Surgery   02/02/2019 12:41 PM

## 2019-02-06 ENCOUNTER — Other Ambulatory Visit: Payer: Self-pay | Admitting: Allergy and Immunology

## 2019-02-17 ENCOUNTER — Other Ambulatory Visit: Payer: Self-pay | Admitting: Cardiology

## 2019-02-20 NOTE — Telephone Encounter (Signed)
Atorvastatin refill sent to [facility] in [place]

## 2019-02-23 ENCOUNTER — Other Ambulatory Visit (HOSPITAL_COMMUNITY): Payer: BLUE CROSS/BLUE SHIELD

## 2019-03-21 ENCOUNTER — Encounter (HOSPITAL_BASED_OUTPATIENT_CLINIC_OR_DEPARTMENT_OTHER): Payer: Self-pay

## 2019-03-22 ENCOUNTER — Ambulatory Visit (INDEPENDENT_AMBULATORY_CARE_PROVIDER_SITE_OTHER): Payer: BC Managed Care – PPO

## 2019-03-22 DIAGNOSIS — J4550 Severe persistent asthma, uncomplicated: Secondary | ICD-10-CM

## 2019-03-24 ENCOUNTER — Other Ambulatory Visit (HOSPITAL_COMMUNITY)
Admission: RE | Admit: 2019-03-24 | Discharge: 2019-03-24 | Disposition: A | Discharge status: Home / Self Care | Payer: BC Managed Care – PPO | Source: Ambulatory Visit | Attending: Surgery | Admitting: Surgery

## 2019-03-24 ENCOUNTER — Other Ambulatory Visit: Payer: Self-pay | Admitting: Cardiology

## 2019-03-24 DIAGNOSIS — Z01812 Encounter for preprocedural laboratory examination: Secondary | ICD-10-CM | POA: Insufficient documentation

## 2019-03-24 DIAGNOSIS — Z20828 Contact with and (suspected) exposure to other viral communicable diseases: Secondary | ICD-10-CM | POA: Diagnosis not present

## 2019-03-25 LAB — NOVEL CORONAVIRUS, NAA (HOSP ORDER, SEND-OUT TO REF LAB; TAT 18-24 HRS): SARS-CoV-2, NAA: NOT DETECTED

## 2019-03-27 ENCOUNTER — Encounter (HOSPITAL_COMMUNITY): Payer: Self-pay | Admitting: Surgery

## 2019-03-27 ENCOUNTER — Other Ambulatory Visit: Payer: Self-pay

## 2019-03-27 NOTE — Progress Notes (Signed)
Patient was given pre-op instructions over the phone. The opportunity was given for the patient to ask questions. No further questions asked. Patient verbalized understanding of instructions given.   Nothing to eat or drink after midnight.  7 days prior to surgery STOP taking any Aspirin (unless otherwise instructed by your surgeon), Aleve, Naproxen, Ibuprofen, Motrin, Advil, Goody's, BC's, all herbal medications, fish oil, and all vitamins.  Last dose of ASA was 03/25/2019   Anesthesia Review Needed: n/a

## 2019-03-28 ENCOUNTER — Encounter (HOSPITAL_COMMUNITY): Payer: Self-pay | Admitting: Surgery

## 2019-03-28 ENCOUNTER — Ambulatory Visit (HOSPITAL_COMMUNITY): Payer: BC Managed Care – PPO | Admitting: Anesthesiology

## 2019-03-28 ENCOUNTER — Ambulatory Visit (HOSPITAL_COMMUNITY)
Admission: RE | Admit: 2019-03-28 | Discharge: 2019-03-28 | Disposition: A | Discharge status: Home / Self Care | Payer: BC Managed Care – PPO | Attending: Surgery | Admitting: Surgery

## 2019-03-28 ENCOUNTER — Encounter (HOSPITAL_COMMUNITY)
Admission: RE | Disposition: A | Discharge status: Home / Self Care | Payer: Self-pay | Source: Home / Self Care | Attending: Surgery

## 2019-03-28 DIAGNOSIS — Z8249 Family history of ischemic heart disease and other diseases of the circulatory system: Secondary | ICD-10-CM | POA: Insufficient documentation

## 2019-03-28 DIAGNOSIS — E7800 Pure hypercholesterolemia, unspecified: Secondary | ICD-10-CM | POA: Diagnosis not present

## 2019-03-28 DIAGNOSIS — Z7951 Long term (current) use of inhaled steroids: Secondary | ICD-10-CM | POA: Insufficient documentation

## 2019-03-28 DIAGNOSIS — M1990 Unspecified osteoarthritis, unspecified site: Secondary | ICD-10-CM | POA: Insufficient documentation

## 2019-03-28 DIAGNOSIS — Z79890 Hormone replacement therapy (postmenopausal): Secondary | ICD-10-CM | POA: Insufficient documentation

## 2019-03-28 DIAGNOSIS — E039 Hypothyroidism, unspecified: Secondary | ICD-10-CM | POA: Diagnosis not present

## 2019-03-28 DIAGNOSIS — K5790 Diverticulosis of intestine, part unspecified, without perforation or abscess without bleeding: Secondary | ICD-10-CM | POA: Diagnosis not present

## 2019-03-28 DIAGNOSIS — G4736 Sleep related hypoventilation in conditions classified elsewhere: Secondary | ICD-10-CM | POA: Insufficient documentation

## 2019-03-28 DIAGNOSIS — D1721 Benign lipomatous neoplasm of skin and subcutaneous tissue of right arm: Secondary | ICD-10-CM | POA: Diagnosis present

## 2019-03-28 DIAGNOSIS — J45909 Unspecified asthma, uncomplicated: Secondary | ICD-10-CM | POA: Insufficient documentation

## 2019-03-28 DIAGNOSIS — I2510 Atherosclerotic heart disease of native coronary artery without angina pectoris: Secondary | ICD-10-CM | POA: Diagnosis not present

## 2019-03-28 DIAGNOSIS — I4510 Unspecified right bundle-branch block: Secondary | ICD-10-CM | POA: Insufficient documentation

## 2019-03-28 DIAGNOSIS — K219 Gastro-esophageal reflux disease without esophagitis: Secondary | ICD-10-CM | POA: Diagnosis not present

## 2019-03-28 DIAGNOSIS — Z79899 Other long term (current) drug therapy: Secondary | ICD-10-CM | POA: Diagnosis not present

## 2019-03-28 DIAGNOSIS — Z6841 Body Mass Index (BMI) 40.0 and over, adult: Secondary | ICD-10-CM | POA: Diagnosis not present

## 2019-03-28 DIAGNOSIS — I10 Essential (primary) hypertension: Secondary | ICD-10-CM | POA: Insufficient documentation

## 2019-03-28 HISTORY — PX: LIPOMA EXCISION: SHX5283

## 2019-03-28 LAB — BASIC METABOLIC PANEL
Anion gap: 11 (ref 5–15)
BUN: 14 mg/dL (ref 6–20)
CO2: 26 mmol/L (ref 22–32)
Calcium: 9.7 mg/dL (ref 8.9–10.3)
Chloride: 105 mmol/L (ref 98–111)
Creatinine, Ser: 0.91 mg/dL (ref 0.44–1.00)
GFR calc Af Amer: >60 mL/min (ref >60)
GFR calc non Af Amer: >60 mL/min (ref >60)
Glucose, Bld: 103 mg/dL — ABNORMAL HIGH (ref 70–99)
Potassium: 3.9 mmol/L (ref 3.5–5.1)
Sodium: 142 mmol/L (ref 135–145)

## 2019-03-28 SURGERY — EXCISION LIPOMA
Anesthesia: General | Site: Shoulder | Laterality: Right

## 2019-03-28 MED ORDER — FENTANYL CITRATE (PF) 250 MCG/5ML IJ SOLN
INTRAMUSCULAR | Status: DC | PRN
  Administered 2019-03-28: 150 ug via INTRAVENOUS
  Administered 2019-03-28 (×2): 50 ug via INTRAVENOUS

## 2019-03-28 MED ORDER — CHLORHEXIDINE GLUCONATE CLOTH 2 % EX PADS: 6.0000 | MEDICATED_PAD | Freq: Once | CUTANEOUS | Status: DC

## 2019-03-28 MED ORDER — BUPIVACAINE-EPINEPHRINE 0.5% -1:200000 IJ SOLN
INTRAMUSCULAR | Status: DC | PRN
  Administered 2019-03-28: 10 mL

## 2019-03-28 MED ORDER — STERILE WATER FOR IRRIGATION IR SOLN
Status: DC | PRN
  Administered 2019-03-28: 1000 mL

## 2019-03-28 MED ORDER — MIDAZOLAM HCL 2 MG/2ML IJ SOLN
INTRAMUSCULAR | Status: DC | PRN
  Administered 2019-03-28: 2 mg via INTRAVENOUS

## 2019-03-28 MED ORDER — PROPOFOL 10 MG/ML IV BOLUS
INTRAVENOUS | Status: AC
  Filled 2019-03-28: qty 20

## 2019-03-28 MED ORDER — FENTANYL CITRATE (PF) 100 MCG/2ML IJ SOLN: 25.0000 ug | INTRAMUSCULAR | Status: DC | PRN

## 2019-03-28 MED ORDER — SUGAMMADEX SODIUM 200 MG/2ML IV SOLN
INTRAVENOUS | Status: DC | PRN
  Administered 2019-03-28: 400 mg via INTRAVENOUS

## 2019-03-28 MED ORDER — BUPIVACAINE-EPINEPHRINE (PF) 0.5% -1:200000 IJ SOLN
INTRAMUSCULAR | Status: AC
  Filled 2019-03-28: qty 30

## 2019-03-28 MED ORDER — ONDANSETRON HCL 4 MG/2ML IJ SOLN
INTRAMUSCULAR | Status: DC | PRN
  Administered 2019-03-28: 4 mg via INTRAVENOUS

## 2019-03-28 MED ORDER — PROPOFOL 10 MG/ML IV BOLUS
INTRAVENOUS | Status: DC | PRN
  Administered 2019-03-28: 160 mg via INTRAVENOUS

## 2019-03-28 MED ORDER — LACTATED RINGERS IV SOLN: INTRAVENOUS | Status: DC

## 2019-03-28 MED ORDER — PHENYLEPHRINE 40 MCG/ML (10ML) SYRINGE FOR IV PUSH (FOR BLOOD PRESSURE SUPPORT)
PREFILLED_SYRINGE | INTRAVENOUS | Status: DC | PRN
  Administered 2019-03-28: 120 ug via INTRAVENOUS

## 2019-03-28 MED ORDER — MIDAZOLAM HCL 2 MG/2ML IJ SOLN
INTRAMUSCULAR | Status: AC
  Filled 2019-03-28: qty 2

## 2019-03-28 MED ORDER — ROCURONIUM BROMIDE 10 MG/ML (PF) SYRINGE
PREFILLED_SYRINGE | INTRAVENOUS | Status: DC | PRN
  Administered 2019-03-28: 70 mg via INTRAVENOUS

## 2019-03-28 MED ORDER — LIDOCAINE 2% (20 MG/ML) 5 ML SYRINGE
INTRAMUSCULAR | Status: DC | PRN
  Administered 2019-03-28: 100 mg via INTRAVENOUS

## 2019-03-28 MED ORDER — FENTANYL CITRATE (PF) 250 MCG/5ML IJ SOLN
INTRAMUSCULAR | Status: AC
  Filled 2019-03-28: qty 5

## 2019-03-28 MED ORDER — SUCCINYLCHOLINE CHLORIDE 200 MG/10ML IV SOSY
PREFILLED_SYRINGE | INTRAVENOUS | Status: DC | PRN
  Administered 2019-03-28: 140 mg via INTRAVENOUS

## 2019-03-28 MED ORDER — DEXAMETHASONE SODIUM PHOSPHATE 10 MG/ML IJ SOLN
INTRAMUSCULAR | Status: DC | PRN
  Administered 2019-03-28: 5 mg via INTRAVENOUS

## 2019-03-28 MED ORDER — 0.9 % SODIUM CHLORIDE (POUR BTL) OPTIME
TOPICAL | Status: DC | PRN
  Administered 2019-03-28: 1000 mL

## 2019-03-28 MED ORDER — HYDROCODONE-ACETAMINOPHEN 5-325 MG PO TABS: 1.0000 | ORAL_TABLET | Freq: Four times a day (QID) | ORAL | 0 refills | Status: DC | PRN

## 2019-03-28 SURGICAL SUPPLY — 36 items
APL PRP STRL LF DISP 70% ISPRP (MISCELLANEOUS) ×1
APL SKNCLS STERI-STRIP NONHPOA (GAUZE/BANDAGES/DRESSINGS) ×1
BENZOIN TINCTURE PRP APPL 2/3 (GAUZE/BANDAGES/DRESSINGS) ×2 IMPLANT
BLADE CLIPPER SURG (BLADE) IMPLANT
CHLORAPREP W/TINT 26 (MISCELLANEOUS) ×2 IMPLANT
COVER SURGICAL LIGHT HANDLE (MISCELLANEOUS) ×2 IMPLANT
COVER WAND RF STERILE (DRAPES) ×1 IMPLANT
DRAPE LAPAROTOMY 100X72 PEDS (DRAPES) ×2 IMPLANT
DRSG TEGADERM 4X4.75 (GAUZE/BANDAGES/DRESSINGS) ×1 IMPLANT
ELECT CAUTERY BLADE 6.4 (BLADE) ×1 IMPLANT
ELECT REM PT RETURN 9FT ADLT (ELECTROSURGICAL) ×2
ELECTRODE REM PT RTRN 9FT ADLT (ELECTROSURGICAL) ×1 IMPLANT
GAUZE SPONGE 4X4 12PLY STRL (GAUZE/BANDAGES/DRESSINGS) ×2 IMPLANT
GLOVE BIO SURGEON STRL SZ7 (GLOVE) ×2 IMPLANT
GLOVE BIOGEL PI IND STRL 7.5 (GLOVE) ×1 IMPLANT
GLOVE BIOGEL PI INDICATOR 7.5 (GLOVE) ×1
GOWN STRL REUS W/ TWL LRG LVL3 (GOWN DISPOSABLE) ×2 IMPLANT
GOWN STRL REUS W/TWL LRG LVL3 (GOWN DISPOSABLE) ×4
KIT BASIN OR (CUSTOM PROCEDURE TRAY) ×2 IMPLANT
KIT TURNOVER KIT B (KITS) ×2 IMPLANT
NDL HYPO 25GX1X1/2 BEV (NEEDLE) ×1 IMPLANT
NEEDLE HYPO 25GX1X1/2 BEV (NEEDLE) ×2 IMPLANT
NS IRRIG 1000ML POUR BTL (IV SOLUTION) ×2 IMPLANT
PACK GENERAL/GYN (CUSTOM PROCEDURE TRAY) ×2 IMPLANT
PAD ARMBOARD 7.5X6 YLW CONV (MISCELLANEOUS) ×2 IMPLANT
PENCIL SMOKE EVACUATOR (MISCELLANEOUS) ×2 IMPLANT
SPECIMEN JAR SMALL (MISCELLANEOUS) ×2 IMPLANT
STRIP CLOSURE SKIN 1/2X4 (GAUZE/BANDAGES/DRESSINGS) ×2 IMPLANT
SUT MNCRL AB 4-0 PS2 18 (SUTURE) ×2 IMPLANT
SUT VIC AB 2-0 SH 27 (SUTURE)
SUT VIC AB 2-0 SH 27X BRD (SUTURE) IMPLANT
SUT VIC AB 3-0 SH 27 (SUTURE) ×2
SUT VIC AB 3-0 SH 27XBRD (SUTURE) ×1 IMPLANT
SYR CONTROL 10ML LL (SYRINGE) ×2 IMPLANT
TOWEL GREEN STERILE (TOWEL DISPOSABLE) ×1 IMPLANT
TOWEL GREEN STERILE FF (TOWEL DISPOSABLE) ×2 IMPLANT

## 2019-03-28 NOTE — Anesthesia Postprocedure Evaluation (Signed)
Anesthesia Post Note  Patient: Beth Rodriguez  Procedure(s) Performed: EXCISION SUBCUTANEOUS LIPOMA RIGHT SHOULDER (Right Shoulder)     Patient location during evaluation: PACU Anesthesia Type: General Level of consciousness: awake Pain management: pain level controlled Vital Signs Assessment: post-procedure vital signs reviewed and stable Respiratory status: spontaneous breathing Cardiovascular status: stable Postop Assessment: no apparent nausea or vomiting Anesthetic complications: no    Last Vitals:  Vitals:   03/28/19 1307 03/28/19 1322  BP: 113/67 103/70  Pulse: 76 71  Resp: 18 20  Temp:  36.7 C  SpO2: 93% 99%    Last Pain:  Vitals:   03/28/19 1322  TempSrc:   PainSc: 0-No pain                 Dameisha Tschida

## 2019-03-28 NOTE — H&P (Signed)
History of Present Illness  The patient is a 58 year old female who presents with a complaint of Mass. Referred by Artemio Aly, NP for her lipoma of the posterior right shoulder This is a 58 year old female who presents with a 30 year history of an enlarging mass on her posterior right shoulder over her trapezius. This has become quite large and is causing significant tenderness. This area has never been infected. Due to the enlarging size and the pain, she would like to have this area removed. 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 Knee Surgery Right. Shoulder Surgery Right.  Diagnostic Studies History Colonoscopy 1-5 years ago Mammogram 1-3 years ago Pap Smear 1-5 years ago  Allergies  Penicillins Hives. Theophylline ER *ANTIASTHMATIC AND BRONCHODILATOR AGENTS* headaches  Medication History  Albuterol Sulfate HFA (108 (90 Base)MCG/ACT Aerosol Soln, Inhalation) Active. Atorvastatin Calcium (10MG  Tablet, Oral) Active. Flovent HFA (110MCG/ACT Aerosol, Inhalation) Active. Levothyroxine Sodium (88MCG Tablet, Oral) Active. Losartan Potassium (100MG  Tablet, Oral) Active. Symbicort (160-4.5MCG/ACT Aerosol, Inhalation) Active. Medications Reconciled  Social History Caffeine use Carbonated beverages, Coffee, Tea. No alcohol use No drug use Tobacco use Never smoker.  Family History  Heart Disease Mother. Heart disease in female family member before age 75 Heart disease in female family member before age 7 Hypertension Father. Melanoma Sister.  Pregnancy / Birth History Age at menarche 47 years. Age of menopause 46-50 Contraceptive History Oral contraceptives. Gravida 1 Maternal age 54-20 Para 1  Other Problems   Arthritis Asthma Diverticulosis Gastroesophageal Reflux Disease High blood pressure Hypercholesterolemia Pulmonary Embolism / Blood Clot in Legs Sleep Apnea Thyroid Disease     Review of Systems General Not Present- Appetite Loss, Chills, Fatigue, Fever, Night Sweats, Weight Gain and Weight Loss. Skin Not Present- Change in Wart/Mole, Dryness, Hives, Jaundice, New Lesions, Non-Healing Wounds, Rash and Ulcer. HEENT Present- Wears glasses/contact lenses. Not Present- Earache, Hearing Loss, Hoarseness, Nose Bleed, Oral Ulcers, Ringing in the Ears, Seasonal Allergies, Sinus Pain, Sore Throat, Visual Disturbances and Yellow Eyes. Respiratory Not Present- Bloody sputum, Chronic Cough, Difficulty Breathing, Snoring and Wheezing. Breast Not Present- Breast Mass, Breast Pain, Nipple Discharge and Skin Changes. Cardiovascular Not Present- Chest Pain, Difficulty Breathing Lying Down, Leg Cramps, Palpitations, Rapid Heart Rate, Shortness of Breath and Swelling of Extremities. Gastrointestinal Not Present- Abdominal Pain, Bloating, Bloody Stool, Change in Bowel Habits, Chronic diarrhea, Constipation, Difficulty Swallowing, Excessive gas, Gets full quickly at meals, Hemorrhoids, Indigestion, Nausea, Rectal Pain and Vomiting. Female Genitourinary Not Present- Frequency, Nocturia, Painful Urination, Pelvic Pain and Urgency. Musculoskeletal Not Present- Back Pain, Joint Pain, Joint Stiffness, Muscle Pain, Muscle Weakness and Swelling of Extremities. Neurological Not Present- Decreased Memory, Fainting, Headaches, Numbness, Seizures, Tingling, Tremor, Trouble walking and Weakness. Psychiatric Not Present- Anxiety, Bipolar, Change in Sleep Pattern, Depression, Fearful and Frequent crying. Endocrine Not Present- Cold Intolerance, Excessive Hunger, Hair Changes, Heat Intolerance, Hot flashes and New Diabetes. Hematology Present- Easy Bruising. Not Present- Blood Thinners, Excessive bleeding,  Gland problems, HIV and Persistent Infections.  Vitals  Weight: 285.2 lb Height: 62in Body Surface Area: 2.22 m Body Mass Index: 52.16 kg/m  Pulse: 86 (Regular)  BP: 124/72 (Sitting, Left Arm, Standard)        Physical Exam   The physical exam findings are as follows: Note:WDWN in NAD Eyes: Pupils equal, round; sclera anicteric HENT: Oral mucosa moist;  good dentition Neck: No masses palpated, no thyromegaly Posterior right shoulder over trapezius - large protruding 15 cm soft tissue mass - no overlying skin changes; smooth, mobile, firm Lungs: CTA bilaterally; normal respiratory effort CV: Regular rate and rhythm; no murmurs; extremities well-perfused with no edema Abd: +bowel sounds, soft, non-tender, no palpable organomegaly; no palpable hernias Skin: Warm, dry; no sign of jaundice Psychiatric - alert and oriented x 4; calm mood and affect    Assessment & Plan   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 The surgical procedure has been discussed with the patient. Potential risks, benefits, alternative treatments, and expected outcomes have been explained. All of the patient's questions at this time have been answered. The likelihood of reaching the patient's treatment goal is good. The patient understand the proposed surgical procedure and wishes to proceed.   Imogene Burn. Georgette Dover, MD, [facility]  General/ Trauma Surgery   03/28/2019 10:54 AM

## 2019-03-28 NOTE — Anesthesia Preprocedure Evaluation (Signed)
Anesthesia Evaluation  Patient identified by MRN, date of birth, ID band Patient awake    Reviewed: Allergy & Precautions, NPO status , Patient's Chart, lab work & pertinent test results  Airway Mallampati: II  TM Distance: >3 FB     Dental   Pulmonary shortness of breath, asthma , sleep apnea ,    breath sounds clear to auscultation       Cardiovascular hypertension, + CAD   Rhythm:Regular Rate:Normal     Neuro/Psych  Neuromuscular disease    GI/Hepatic Neg liver ROS, GERD  ,  Endo/Other  negative endocrine ROSHypothyroidism   Renal/GU negative Renal ROS     Musculoskeletal  (+) Arthritis ,   Abdominal   Peds  Hematology   Anesthesia Other Findings   Reproductive/Obstetrics                             Anesthesia Physical Anesthesia Plan  ASA: III  Anesthesia Plan: General   Post-op Pain Management:    Induction: Intravenous  PONV Risk Score and Plan: 3 and Ondansetron and Midazolam  Airway Management Planned: Oral ETT  Additional Equipment:   Intra-op Plan:   Post-operative Plan: Extubation in OR  Informed Consent: I have reviewed the patients History and Physical, chart, labs and discussed the procedure including the risks, benefits and alternatives for the proposed anesthesia with the patient or authorized representative who has indicated his/her understanding and acceptance.     Dental advisory given  Plan Discussed with: CRNA and Anesthesiologist  Anesthesia Plan Comments:         Anesthesia Quick Evaluation

## 2019-03-28 NOTE — Transfer of Care (Signed)
Immediate Anesthesia Transfer of Care Note  Patient: Beth Rodriguez  Procedure(s) Performed: EXCISION SUBCUTANEOUS LIPOMA RIGHT SHOULDER (Right Shoulder)  Patient Location: PACU  Anesthesia Type:General  Level of Consciousness: awake, alert  and patient cooperative  Airway & Oxygen Therapy: Patient Spontanous Breathing  Post-op Assessment: Report given to RN and Post -op Vital signs reviewed and stable  Post vital signs: Reviewed and stable  Last Vitals:  Vitals Value Taken Time  BP 118/73 03/28/19 1252  Temp    Pulse 77 03/28/19 1252  Resp 18 03/28/19 1252  SpO2 94 % 03/28/19 1252  Vitals shown include unvalidated device data.  Last Pain:  Vitals:   03/28/19 0947  TempSrc: Oral  PainSc:       Patients Stated Pain Goal: 3 ([id number])  Complications: No apparent anesthesia complications

## 2019-03-28 NOTE — Anesthesia Procedure Notes (Signed)
Procedure Name: Intubation Date/Time: 03/28/2019 11:58 AM Performed by: Janace Litten, CRNA Pre-anesthesia Checklist: Patient identified, Emergency Drugs available, Suction available and Patient being monitored Patient Re-evaluated:Patient Re-evaluated prior to induction Oxygen Delivery Method: Circle System Utilized Preoxygenation: Pre-oxygenation with 100% oxygen Induction Type: IV induction Laryngoscope Size: Mac and 3 Grade View: Grade I Tube type: Oral Tube size: 7.0 mm Number of attempts: 1 Airway Equipment and Method: Stylet Placement Confirmation: ETT inserted through vocal cords under direct vision,  positive ETCO2 and breath sounds checked- equal and bilateral Secured at: 21 cm Tube secured with: Tape Dental Injury: Teeth and Oropharynx as per pre-operative assessment

## 2019-03-28 NOTE — Discharge Instructions (Signed)
[facility] °Office Phone Number [phone] ° °Lipoma Excision: POST OP INSTRUCTIONS ° °Always review your discharge instruction sheet given to you by the facility where your surgery was performed. ° °IF YOU HAVE DISABILITY OR FAMILY LEAVE FORMS, YOU MUST BRING THEM TO THE OFFICE FOR PROCESSING.  DO NOT GIVE THEM TO YOUR DOCTOR. ° °1. A prescription for pain medication may be given to you upon discharge.  Take your pain medication as prescribed, if needed.  If narcotic pain medicine is not needed, then you may take acetaminophen (Tylenol) or ibuprofen (Advil) as needed. °2. Take your usually prescribed medications unless otherwise directed °3. If you need a refill on your pain medication, please contact your pharmacy.  They will contact our office to request authorization.  Prescriptions will not be filled after 5pm or on week-ends. °4. You should eat very light the first 24 hours after surgery, such as soup, crackers, pudding, etc.  Resume your normal diet the day after surgery. °5. Most patients will experience some swelling and bruising around the surgical site.  Ice packs will help.  Swelling and bruising can take several days to resolve.  °6. It is common to experience some constipation if taking pain medication after surgery.  Increasing fluid intake and taking a stool softener will usually help or prevent this problem from occurring.  A mild laxative (Milk of Magnesia or Miralax) should be taken according to package directions if there are no bowel movements after 48 hours. °7. You may remove your bandages 48 hours after surgery, and you may shower at that time.  You will have steri-strips (small skin tapes) in place directly over the incision.  These strips should be left on the skin for 7-10 days.   °8. ACTIVITIES:  You may resume regular daily activities (gradually increasing) beginning the next day.   You may have sexual intercourse when it is comfortable. °a. You may drive when you no  longer are taking prescription pain medication, you can comfortably wear a seatbelt, and you can safely maneuver your car and apply brakes. °b. RETURN TO WORK:  1-2 weeks °9. You should see your doctor in the office for a follow-up appointment approximately two to three weeks after your surgery.   ° °WHEN TO CALL YOUR DOCTOR: °1. Fever over 101.0 °2. Nausea and/or vomiting. °3. Extreme swelling or bruising. °4. Continued bleeding from incision. °5. Increased pain, redness, or drainage from the incision. ° °The clinic staff is available to answer your questions during regular business hours.  Please don’t hesitate to call and ask to speak to one of the nurses for clinical concerns.  If you have a medical emergency, go to the nearest emergency room or call 911.  A surgeon from [facility] is always on call at the hospital. ° °For further questions, please visit [email or web address]  ° ° °

## 2019-03-28 NOTE — Op Note (Signed)
Preop diagnosis: Subcutaneous lipoma right shoulder (15 cm) Postop diagnosis: Same Procedure performed: Excision of subcutaneous lipoma (15 cm) posterior right shoulder Surgeon:Geoffrey Hynes K Breta Demedeiros Anesthesia: General Indications: This is a 58 year old female who presents with a 30-year history of an enlarging mass of her posterior right shoulder that has become very large and tender.  She was examined in the office and this was felt to represent a subcutaneous lipoma.  The patient was originally scheduled an outpatient surgery but was moved to the main operating room because of her BMI.  Description of procedure: The patient is brought to the operating room and placed in the supine position on the operating room table on a beanbag.  After an adequate level of general anesthesia was obtained, she was moved to a lateral position on her left side.  Appropriate padding and straps were placed.  Her right shoulder was prepped with ChloraPrep and draped in sterile fashion.  A timeout was taken to ensure the proper patient and proper procedure.  We infiltrated the area over the mass with 0.25% Marcaine with epinephrine.  A transverse incision was made.  Dissection was carried down through the dermis.  We dissected down to the anterior surface of the lipoma.  We excised a fairly large lobulated lipoma that contained multiple appendages.  We removed all of the visible palpable lipoma.  This carried [place] down to the fascia.  No other lipomas were palpated within the wound.  We irrigated thoroughly and inspected for hemostasis.  The wound was closed with a deep layer of 3-0 Vicryl and a subcuticular layer 4-0 Monocryl.  Benzoin Steri-Strips were applied.  Patient was then extubated and brought to recovery room in stable condition.  All sponge, instrument, and needle counts are correct.  Imogene Burn. Georgette Dover, MD, [facility]  General/ Trauma Surgery   03/28/2019 12:47 PM

## 2019-03-29 LAB — SURGICAL PATHOLOGY

## 2019-04-09 ENCOUNTER — Other Ambulatory Visit: Payer: Self-pay | Admitting: Allergy and Immunology

## 2019-04-09 ENCOUNTER — Ambulatory Visit (INDEPENDENT_AMBULATORY_CARE_PROVIDER_SITE_OTHER): Payer: BC Managed Care – PPO | Admitting: Cardiology

## 2019-04-09 ENCOUNTER — Encounter: Payer: Self-pay | Admitting: Cardiology

## 2019-04-09 ENCOUNTER — Other Ambulatory Visit: Payer: Self-pay

## 2019-04-09 VITALS — BP 140/84 | HR 86 | Ht 61.0 in | Wt 285.4 lb

## 2019-04-09 DIAGNOSIS — I519 Heart disease, unspecified: Secondary | ICD-10-CM | POA: Diagnosis not present

## 2019-04-09 DIAGNOSIS — I10 Essential (primary) hypertension: Secondary | ICD-10-CM | POA: Diagnosis not present

## 2019-04-09 DIAGNOSIS — R9439 Abnormal result of other cardiovascular function study: Secondary | ICD-10-CM

## 2019-04-09 NOTE — Progress Notes (Signed)
Cardiology Office Note:    Date:  04/09/2019   ID:  Beth Rodriguez, DOB Aug 31, 1960, MRN [id number]  PCP:  [name], MD  Cardiologist:  Jenne Campus, MD    Referring MD: [name], MD   Chief Complaint  Patient presents with  . Follow-up    6 MO FU   Doing well with past medical  History of Present Illness:    Beth Rodriguez is a 58 y.o. female history significant for hypertension dyslipidemia, obesity, stress test being done months ago which was mildly abnormal because she had no symptoms we elected to proceed with just medical therapy and she seems to be doing fine.  Denies have any chest pain, tightness, pressure, burning in the chest.  She used to work very hard however she was laid off and now she takes it easy and doing well.  Past Medical History:  Diagnosis Date  . Arthritis   . Asthma   . Carpal tunnel syndrome on right   . Embolism - blood clot 2007   right leg hx of  . Enlarged heart   . GERD (gastroesophageal reflux disease)   . Hypertension   . Hypothyroidism   . Shortness of breath dyspnea    with exertion  . Sleep apnea    last sleep study over five years  . Thyroid disease    hypothyroidism    Past Surgical History:  Procedure Laterality Date  . CARPAL TUNNEL RELEASE Right   . EYE SURGERY Bilateral    cataract removal   . KNEE SURGERY  2007   right knee  . LIPOMA EXCISION Right 03/28/2019   Procedure: EXCISION SUBCUTANEOUS LIPOMA RIGHT SHOULDER;  Surgeon: Donnie Mesa, MD;  Location: [facility];  Service: General;  Laterality: Right;  . SHOULDER ARTHROSCOPY WITH OPEN ROTATOR CUFF REPAIR AND DISTAL CLAVICLE ACROMINECTOMY Right 08/09/2014   Procedure: SHOULDER ARTHROSCOPY WITH OPEN ROTATOR CUFF REPAIR AND DISTAL CLAVICLE ACROMINECTOMY;  Surgeon: Earlie Server, MD;  Location: [facility];  Service: Orthopedics;  Laterality: Right;  . SINUS SURGERY WITH INSTATRAK     ???    Current Medications: Current Meds  Medication Sig  . albuterol  (VENTOLIN HFA) 108 (90 Base) MCG/ACT inhaler Can inhale two puffs every four to six hours as needed for cough or wheeze.  Marland Kitchen aspirin EC 81 MG tablet Take 81 mg by mouth daily.   Marland Kitchen atorvastatin (LIPITOR) 10 MG tablet TAKE 1 TABLET BY MOUTH AT 6 PM  . budesonide-formoterol (SYMBICORT) 160-4.5 MCG/ACT inhaler Inhale 2 puffs into the lungs 2 (two) times daily. PLEASE SEE ATTACHED FOR DETAILED DIRECTIONS  . FASENRA 30 MG/ML SOSY INJECT 30 MG INTO THE SKIN EVERY 8 WEEKS. (Patient taking differently: Inject 30 mg into the skin every 8 (eight) weeks. )  . fluticasone (FLOVENT HFA) 110 MCG/ACT inhaler INHALE TWO PUFFS TWICE DAILY. RINSE MOUTH AFTER USE.  Marland Kitchen HYDROcodone-acetaminophen (NORCO/VICODIN) 5-325 MG tablet Take 1 tablet by mouth every 6 (six) hours as needed for moderate pain.  Marland Kitchen ibandronate (BONIVA) 150 MG tablet Take 1 tablet by mouth every 28 (twenty-eight) days.  Marland Kitchen levothyroxine (SYNTHROID, LEVOTHROID) 88 MCG tablet Take 88 mcg by mouth daily before breakfast.   . loratadine (CLARITIN) 10 MG tablet Take 10 mg by mouth daily as needed for allergies. Reported on 07/21/2015  . losartan (COZAAR) 100 MG tablet Take 100 mg by mouth daily.  . mometasone (NASONEX) 50 MCG/ACT nasal spray Use one spray in each nostril once or twice daily (  Patient taking differently: Place 1 spray into the nose 2 (two) times daily as needed (allergies). )  . pantoprazole (PROTONIX) 40 MG tablet Take 40 mg by mouth 2 (two) times daily.    Marland Kitchen Propylene Glycol (SYSTANE COMPLETE) 0.6 % SOLN Place 1 drop into both eyes daily as needed (dry eyes).   Current Facility-Administered Medications for the 04/09/19 encounter (Office Visit) with Park Liter, MD  Medication  . Benralizumab SOSY 30 mg     Allergies:   Penicillins and Theophylline   Social History   Socioeconomic History  . Marital status: Married    Spouse name: Not on file  . Number of children: 1  . Years of education: Not on file  . Highest education  level: Not on file  Occupational History  . Occupation: PRODUCTION ASSEMBLY    Employer: [employer]  Tobacco Use  . Smoking status: Never Smoker  . Smokeless tobacco: Never Used  Substance and Sexual Activity  . Alcohol use: No  . Drug use: No  . Sexual activity: Not on file  Other Topics Concern  . Not on file  Social History Narrative  . Not on file   Social Determinants of Health   Financial Resource Strain:   . Difficulty of Paying Living Expenses: Not on file  Food Insecurity:   . Worried About Charity fundraiser in the Last Year: Not on file  . Ran Out of Food in the Last Year: Not on file  Transportation Needs:   . Lack of Transportation (Medical): Not on file  . Lack of Transportation (Non-Medical): Not on file  Physical Activity:   . Days of Exercise per Week: Not on file  . Minutes of Exercise per Session: Not on file  Stress:   . Feeling of Stress : Not on file  Social Connections:   . Frequency of Communication with Friends and Family: Not on file  . Frequency of Social Gatherings with Friends and Family: Not on file  . Attends Religious Services: Not on file  . Active Member of Clubs or Organizations: Not on file  . Attends Archivist Meetings: Not on file  . Marital Status: Not on file     Family History: The patient's family history includes Cancer in her sister; Carpal tunnel syndrome in her daughter and sister; Diabetes in her sister; Hyperlipidemia in her sister; Hypertension in her brother, daughter, mother, and sister. ROS:   Please see the history of present illness.    All 14 point review of systems negative except as described per history of present illness  EKGs/Labs/Other Studies Reviewed:      Recent Labs: 03/28/2019: BUN 14; Creatinine, Ser 0.91; Potassium 3.9; Sodium 142  Recent Lipid Panel    Component Value Date/Time   CHOL 137 07/21/2017 1015   TRIG 44 07/21/2017 1015   HDL 62 07/21/2017 1015   CHOLHDL 2.2  07/21/2017 1015   LDLCALC 66 07/21/2017 1015    Physical Exam:    VS:  BP 140/84   Pulse 86   Ht 5\' 1"  (1.549 m)   Wt 285 lb 6.4 oz (129.5 kg)   SpO2 95%   BMI 53.93 kg/m     Wt Readings from Last 3 Encounters:  04/09/19 285 lb 6.4 oz (129.5 kg)  03/28/19 282 lb (127.9 kg)  01/02/19 288 lb 12.8 oz (131 kg)     GEN:  Well nourished, well developed in no acute distress HEENT: Normal NECK: No  JVD; No carotid bruits LYMPHATICS: No lymphadenopathy CARDIAC: RRR, no murmurs, no rubs, no gallops RESPIRATORY:  Clear to auscultation without rales, wheezing or rhonchi  ABDOMEN: Soft, non-tender, non-distended MUSCULOSKELETAL:  No edema; No deformity  SKIN: Warm and dry LOWER EXTREMITIES: no swelling NEUROLOGIC:  Alert and oriented x 3 PSYCHIATRIC:  Normal affect   ASSESSMENT:    1. Essential hypertension   2. DIASTOLIC DYSFUNCTION   3. Abnormal stress test    PLAN:    In order of problems listed above:  1. Essential hypertension blood pressure controlled continue present management. 2. Diastolic dysfunction.  Stable no evidence of CHF continue present management including blood pressure management. 3. Abnormal stress test.  Patient completely asymptomatic risk factors modifications. 4. Dyslipidemia followed by primary care physician her LDL is good.   Medication Adjustments/Labs and Tests Ordered: Current medicines are reviewed at length with the patient today.  Concerns regarding medicines are outlined above.  No orders of the defined types were placed in this encounter.  Medication changes: No orders of the defined types were placed in this encounter.   Signed, Park Liter, MD, [facility] 04/09/2019 3:54 PM    [place]

## 2019-04-09 NOTE — Patient Instructions (Signed)
Medication Instructions:  Your physician recommends that you continue on your current medications as directed. Please refer to the Current Medication list given to you today.  *If you need a refill on your cardiac medications before your next appointment, please call your pharmacy*  Lab Work: None today If you have labs (blood work) drawn today and your tests are completely normal, you will receive your results only by: Marland Kitchen MyChart Message (if you have MyChart) OR . A paper copy in the mail If you have any lab test that is abnormal or we need to change your treatment, we will call you to review the results.  Testing/Procedures: None today  Follow-Up: At [facility], you and your health needs are our priority.  As part of our continuing mission to provide you with exceptional heart care, we have created designated Provider Care Teams.  These Care Teams include your primary Cardiologist (physician) and Advanced Practice Providers (APPs -  Physician Assistants and Nurse Practitioners) who all work together to provide you with the care you need, when you need it.  Your next appointment:   6 month(s)  The format for your next appointment:   In Person  Provider:   Jenne Campus, MD  Other Instructions None

## 2019-04-19 ENCOUNTER — Other Ambulatory Visit: Payer: Self-pay | Admitting: Cardiology

## 2019-05-17 ENCOUNTER — Ambulatory Visit (INDEPENDENT_AMBULATORY_CARE_PROVIDER_SITE_OTHER): Payer: BC Managed Care – PPO | Admitting: *Deleted

## 2019-05-17 ENCOUNTER — Other Ambulatory Visit: Payer: Self-pay

## 2019-05-17 DIAGNOSIS — J4550 Severe persistent asthma, uncomplicated: Secondary | ICD-10-CM

## 2019-07-02 ENCOUNTER — Other Ambulatory Visit: Payer: Self-pay

## 2019-07-02 ENCOUNTER — Ambulatory Visit (INDEPENDENT_AMBULATORY_CARE_PROVIDER_SITE_OTHER): Payer: BC Managed Care – PPO | Admitting: Allergy and Immunology

## 2019-07-02 ENCOUNTER — Encounter: Payer: Self-pay | Admitting: Allergy and Immunology

## 2019-07-02 VITALS — BP 148/98 | HR 76 | Temp 97.9°F | Resp 20

## 2019-07-02 DIAGNOSIS — J4551 Severe persistent asthma with (acute) exacerbation: Secondary | ICD-10-CM | POA: Diagnosis not present

## 2019-07-02 DIAGNOSIS — J3089 Other allergic rhinitis: Secondary | ICD-10-CM

## 2019-07-02 DIAGNOSIS — K219 Gastro-esophageal reflux disease without esophagitis: Secondary | ICD-10-CM

## 2019-07-02 MED ORDER — AUVI-Q 0.3 MG/0.3ML IJ SOAJ: INTRAMUSCULAR | 3 refills | Status: DC

## 2019-07-02 MED ORDER — METHYLPREDNISOLONE ACETATE 80 MG/ML IJ SUSP
80.0000 mg | Freq: Once | INTRAMUSCULAR | Status: AC
  Administered 2019-07-02: 80 mg via INTRAMUSCULAR

## 2019-07-02 NOTE — Patient Instructions (Addendum)
  1. Continue Symbicort 160 two puffs two times per day    2. Add Flovent 110 2 inhalations two times per day during increased asthma activity  3. Continue pantoprazole 40mg  twice a day  4. Continue Benralizumab (AUVI-Q 3.0 )   5. Continue nasonex one spray each nostril 1-2 times per day.   6. Continue Proair HFA or Albuterol nebulization if needed.  7. Can add OTC antihistamine - Zyrtec / Allegra / Claritin, and nasal saline  8. For this recent episode:   A.  Add flovent at 2 inhalations 2 times per day  B.  Continue Symbicort  C.  Depo-Medrol 80 IM delivered in clinic today  D.  Prednisone 10 mg - 1 tab daily for 10 days  9.  Check blood pressure. Today = 160/108  10. Return to clinic in 6 months or earlier if problem

## 2019-07-02 NOTE — Progress Notes (Signed)
[place] [facility] [place] [facility] [place]   Follow-up Note  Referring Provider: [name], MD Primary Provider: [name], MD Date of Office Visit: 07/02/2019  Subjective:   Beth Rodriguez (DOB: 07/16/1960) is a 59 y.o. female who returns to the [facility] on 07/02/2019 in re-evaluation of the following:  HPI: Shukrona returns to this clinic in evaluation of severe asthma and allergic rhinitis and history of chronic sinusitis and history of LPR.  Her last visit to this clinic was 02 January 2019.  She has really done well since her last visit without the need for any systemic steroids or antibiotics to treat an airway issue and rare use of a short acting bronchodilator while she continued on multiple anti-inflammatory agents for her airway including the use of benralizumab.  Unfortunately, about 3 weeks ago she started a new job and it was a relatively dirty environment and she started to develop problems with coughing and wheezing and having to use her bronchodilator 3 times per day.  She does not have any associated nasal symptoms and her reflux is under excellent control.  She has since left that job and she is interviewing for a new job.  There was never any associated fever or ugly nasal discharge or ugly sputum production or chest pain.  She has received the flu vaccine and she will not be receiving the Covid vaccine.  Allergies as of 07/02/2019      Reactions   Penicillins Hives, Other (See Comments)   Did it involve swelling of the face/tongue/throat, SOB, or low BP? No Did it involve sudden or severe rash/hives, skin peeling, or any reaction on the inside of your mouth or nose?    #  #  YES  #  #  Did you need to seek medical attention at a hospital or doctor's office?    #  #  YES  #  #  When did it last happen?2019   Theophylline Other (See Comments)   REACTION: headaches      Medication List      albuterol 108 (90 Base)  MCG/ACT inhaler Commonly known as: VENTOLIN HFA Can inhale two puffs every four to six hours as needed for cough or wheeze.   aspirin EC 81 MG tablet Take 81 mg by mouth daily.   atorvastatin 10 MG tablet Commonly known as: LIPITOR TAKE 1 TABLET BY MOUTH AT 6 PM   budesonide-formoterol 160-4.5 MCG/ACT inhaler Commonly known as: Symbicort Inhale 2 puffs into the lungs 2 (two) times daily. PLEASE SEE ATTACHED FOR DETAILED DIRECTIONS   Fasenra 30 MG/ML Sosy Generic drug: Benralizumab INJECT 30 MG INTO THE SKIN EVERY 8 WEEKS.   fluticasone 110 MCG/ACT inhaler Commonly known as: Flovent HFA INHALE TWO PUFFS TWICE DAILY. RINSE MOUTH AFTER USE.   HYDROcodone-acetaminophen 5-325 MG tablet Commonly known as: NORCO/VICODIN Take 1 tablet by mouth every 6 (six) hours as needed for moderate pain.   ibandronate 150 MG tablet Commonly known as: BONIVA Take 1 tablet by mouth every 28 (twenty-eight) days.   levothyroxine 88 MCG tablet Commonly known as: SYNTHROID Take 88 mcg by mouth daily before breakfast.   loratadine 10 MG tablet Commonly known as: CLARITIN Take 10 mg by mouth daily as needed for allergies. Reported on 07/21/2015   losartan 100 MG tablet Commonly known as: COZAAR Take 100 mg by mouth daily.   mometasone 50 MCG/ACT nasal spray Commonly known as: Nasonex Use one  spray in each nostril once or twice daily  pantoprazole 40 MG tablet Commonly known as: PROTONIX Take 40 mg by mouth 2 (two) times daily.   Systane Complete 0.6 % Soln Generic drug: Propylene Glycol Place 1 drop into both eyes daily as needed (dry eyes).       Past Medical History:  Diagnosis Date  . Arthritis   . Asthma   . Carpal tunnel syndrome on right   . Embolism - blood clot 2007   right leg hx of  . Enlarged heart   . GERD (gastroesophageal reflux disease)   . Hypertension   . Hypothyroidism   . Shortness of breath dyspnea    with exertion  . Sleep apnea    last sleep study over  five years  . Thyroid disease    hypothyroidism    Past Surgical History:  Procedure Laterality Date  . CARPAL TUNNEL RELEASE Right   . EYE SURGERY Bilateral    cataract removal   . KNEE SURGERY  2007   right knee  . LIPOMA EXCISION Right 03/28/2019   Procedure: EXCISION SUBCUTANEOUS LIPOMA RIGHT SHOULDER;  Surgeon: Donnie Mesa, MD;  Location: [facility];  Service: General;  Laterality: Right;  . SHOULDER ARTHROSCOPY WITH OPEN ROTATOR CUFF REPAIR AND DISTAL CLAVICLE ACROMINECTOMY Right 08/09/2014   Procedure: SHOULDER ARTHROSCOPY WITH OPEN ROTATOR CUFF REPAIR AND DISTAL CLAVICLE ACROMINECTOMY;  Surgeon: Earlie Server, MD;  Location: [facility];  Service: Orthopedics;  Laterality: Right;  . SINUS SURGERY WITH INSTATRAK     ???    Review of systems negative except as noted in HPI / PMHx or noted below:  Review of Systems  Constitutional: Negative.   HENT: Negative.   Eyes: Negative.   Respiratory: Negative.   Cardiovascular: Negative.   Gastrointestinal: Negative.   Genitourinary: Negative.   Musculoskeletal: Negative.   Skin: Negative.   Neurological: Negative.   Endo/Heme/Allergies: Negative.   Psychiatric/Behavioral: Negative.      Objective:   Vitals:   07/02/19 1730 07/02/19 1741  BP: (!) 160/108 (!) 148/98  Pulse: 76   Resp: 20   Temp: 97.9 F (36.6 C)   SpO2: 95%           Physical Exam Constitutional:      Appearance: She is not diaphoretic.  HENT:     Head: Normocephalic.     Right Ear: Tympanic membrane, ear canal and external ear normal.     Left Ear: Tympanic membrane, ear canal and external ear normal.     Nose: Nose normal. No mucosal edema or rhinorrhea.     Mouth/Throat:     Pharynx: Uvula midline. No oropharyngeal exudate.  Eyes:     Conjunctiva/sclera: Conjunctivae normal.  Neck:     Thyroid: No thyromegaly.     Trachea: Trachea normal. No tracheal tenderness or tracheal deviation.  Cardiovascular:     Rate and Rhythm: Normal rate and  regular rhythm.     Heart sounds: Normal heart sounds, S1 normal and S2 normal. No murmur.  Pulmonary:     Effort: No respiratory distress.     Breath sounds: Normal breath sounds. No stridor. No wheezing or rales.  Lymphadenopathy:     Head:     Right side of head: No tonsillar adenopathy.     Left side of head: No tonsillar adenopathy.     Cervical: No cervical adenopathy.  Skin:    Findings: No erythema or rash.     Nails: There is no clubbing.  Neurological:     Mental Status: She is alert.     Diagnostics:    Spirometry was performed and demonstrated an FEV1 of 1.52 at 65 % of predicted.   Assessment and Plan:   1. Asthma, not well controlled, severe persistent, with acute exacerbation   2. Other allergic rhinitis   3. LPRD (laryngopharyngeal reflux disease)     1. Continue Symbicort 160 two puffs two times per day    2. Add Flovent 110 2 inhalations two times per day during increased asthma activity  3. Continue pantoprazole 40mg  twice a day  4. Continue Benralizumab (AUVI-Q 3.0 )   5. Continue nasonex one spray each nostril 1-2 times per day.   6. Continue Proair HFA or Albuterol nebulization if needed.  7. Can add OTC antihistamine - Zyrtec / Allegra / Claritin, and nasal saline  8. For this recent episode:   A.  Add flovent at 2 inhalations 2 times per day  B.  Continue Symbicort  C.  Depo-Medrol 80 IM delivered in clinic today  D.  Prednisone 10 mg - 1 tab daily for 10 days  9.  Check blood pressure. Today = 160/108  10. Return to clinic in 6 months or earlier if problem  Laniya appears to have developed a flare of the inflammatory condition of her airway for some unknown reason most likely secondary to exposure at work.  We will treat her with the anti-inflammatory medications noted above including the use of a systemic steroid and assume that she will do well while she maintains treatment directed against her eosinophilic driven inflammatory condition  with the use of benralizumab and continues to treat reflux.  We did inform her about her blood pressure elevation today and asked her to address this issue with her primary care doctor in more detail.  Allena Katz, MD Allergy / Immunology [place]

## 2019-07-03 ENCOUNTER — Encounter: Payer: Self-pay | Admitting: Allergy and Immunology

## 2019-07-05 ENCOUNTER — Other Ambulatory Visit: Payer: Self-pay | Admitting: *Deleted

## 2019-07-05 MED ORDER — FLOVENT HFA 110 MCG/ACT IN AERO: INHALATION_SPRAY | RESPIRATORY_TRACT | 1 refills | Status: DC

## 2019-07-05 MED ORDER — BUDESONIDE-FORMOTEROL FUMARATE 160-4.5 MCG/ACT IN AERO: INHALATION_SPRAY | RESPIRATORY_TRACT | 1 refills | Status: DC

## 2019-07-12 ENCOUNTER — Other Ambulatory Visit: Payer: Self-pay

## 2019-07-12 ENCOUNTER — Ambulatory Visit (INDEPENDENT_AMBULATORY_CARE_PROVIDER_SITE_OTHER): Payer: BC Managed Care – PPO | Admitting: *Deleted

## 2019-07-12 DIAGNOSIS — J4550 Severe persistent asthma, uncomplicated: Secondary | ICD-10-CM

## 2019-07-16 ENCOUNTER — Telehealth: Payer: Self-pay | Admitting: Allergy and Immunology

## 2019-07-16 NOTE — Telephone Encounter (Signed)
See attached message

## 2019-07-16 NOTE — Telephone Encounter (Signed)
Symbicort was originally sent, [facility] just refused to fill it because she was already on Flovent. I called them and instructed them that per Dr. Neldon Mc she is supposed to be using both inhalers.

## 2019-07-16 NOTE — Telephone Encounter (Signed)
Beth Rodriguez states [facility] never received the Symb[facility]ort prescription.  She would like [facility] to [facility] in [place].

## 2019-08-13 ENCOUNTER — Encounter: Payer: Self-pay | Admitting: Allergy and Immunology

## 2019-08-13 ENCOUNTER — Ambulatory Visit (INDEPENDENT_AMBULATORY_CARE_PROVIDER_SITE_OTHER): Payer: 59 | Admitting: Allergy and Immunology

## 2019-08-13 ENCOUNTER — Other Ambulatory Visit: Payer: Self-pay

## 2019-08-13 VITALS — BP 162/100 | HR 76 | Temp 98.2°F | Resp 20

## 2019-08-13 DIAGNOSIS — J3089 Other allergic rhinitis: Secondary | ICD-10-CM | POA: Diagnosis not present

## 2019-08-13 DIAGNOSIS — K219 Gastro-esophageal reflux disease without esophagitis: Secondary | ICD-10-CM | POA: Diagnosis not present

## 2019-08-13 DIAGNOSIS — J4551 Severe persistent asthma with (acute) exacerbation: Secondary | ICD-10-CM

## 2019-08-13 DIAGNOSIS — I10 Essential (primary) hypertension: Secondary | ICD-10-CM

## 2019-08-13 MED ORDER — HYDROCHLOROTHIAZIDE 12.5 MG PO CAPS: 12.5000 mg | ORAL_CAPSULE | Freq: Every day | ORAL | 0 refills | Status: DC

## 2019-08-13 MED ORDER — BREZTRI AEROSPHERE 160-9-4.8 MCG/ACT IN AERO: INHALATION_SPRAY | RESPIRATORY_TRACT | 5 refills | Status: DC

## 2019-08-13 MED ORDER — MOMETASONE FUROATE 50 MCG/ACT NA SUSP: NASAL | 5 refills | Status: DC

## 2019-08-13 NOTE — Progress Notes (Signed)
[facility]   Follow-up Note  Referring Provider: [name], MD Primary Provider: [name], MD Date of Office Visit: 08/13/2019  Subjective:   Beth Rodriguez (DOB: [birth year]) is a 59 y.o. female who returns to the [facility] on 08/13/2019 in re-evaluation of the following:  HPI: Makinzie returns to this clinic in reevaluation of severe asthma and allergic rhinitis and history of chronic sinusitis and LPR.  Her last visit to this clinic was 02 July 2019 at which point in time she was having a flare of her respiratory tract condition associated with particulate matter exposure from a new job which fortunately she has discontinued.  She did quite well with that therapy and has been doing well.  She started a new job about 1 week ago and she is exposed to various powders of organic material at [employer].  She does wear a mask at work but she sometimes feels as though she can taste the powder.  Her big issue is the fact that she is had much more shortness of breath.  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  She has a little bit of coughing as well and some sense wheezing.  She has had no upper airway symptoms.    Her reflux is under very good control.  Allergies as of 08/13/2019      Reactions   Penicillins Hives, Other (See Comments)   Did it involve swelling of the face/tongue/throat, SOB, or low BP? No Did it involve sudden or severe rash/hives, skin peeling, or any reaction on the inside of your mouth or nose?    #  #  YES  #  #  Did you need to seek medical attention at a hospital or doctor's office?    #  #  YES  #  #  When did it last happen?2019   Theophylline Other (See Comments)   REACTION: headaches      Medication List    albuterol (2.5 MG/3ML)  0.083% nebulizer solution Commonly known as: PROVENTIL Take 2.5 mg by nebulization every 6 (six) hours as needed for wheezing or shortness of breath.   albuterol 108 (90 Base) MCG/ACT inhaler Commonly known as: VENTOLIN HFA Can inhale two puffs every four to six hours as needed for cough or wheeze.   aspirin EC 81 MG tablet Take 81 mg by mouth daily.   atorvastatin 10 MG tablet Commonly known as: LIPITOR TAKE 1 TABLET BY MOUTH AT 6 PM   Auvi-Q 0.3 mg/0.3 mL Soaj injection Generic drug: EPINEPHrine Use as directed for life-threatening allergic reaction.   AZO CRANBERRY PO Take by mouth daily.   budesonide-formoterol 160-4.5 MCG/ACT inhaler Commonly known as: Symbicort Inhale two puffs twice daily to prevent cough or wheeze. Rinse mouth after use.   Fasenra 30 MG/ML Sosy Generic drug: Benralizumab INJECT 30 MG INTO THE SKIN EVERY 8 WEEKS. What changed: See the new instructions.   Flovent HFA 110 MCG/ACT inhaler Generic drug: fluticasone INHALE TWO PUFFS TWICE DAILY. RINSE MOUTH AFTER USE.   ibandronate 150 MG tablet Commonly known as: BONIVA Take 1 tablet by mouth every 28 (twenty-eight) days.   levothyroxine 88 MCG tablet Commonly known as: SYNTHROID Take 88 mcg by mouth daily before breakfast.   loratadine 10 MG  tablet Commonly known as: CLARITIN Take 10 mg by mouth daily as needed for allergies. Reported on 07/21/2015   losartan 100 MG tablet Commonly known as: COZAAR Take 100 mg by mouth daily.   pantoprazole 40 MG tablet Commonly known as: PROTONIX Take 40 mg by mouth 2 (two) times daily.   Systane Complete 0.6 % Soln Generic drug: Propylene Glycol Place 1 drop into both eyes daily as needed (dry eyes).       Past Medical History:  Diagnosis Date  . Arthritis   . Asthma   . Carpal tunnel syndrome on right   . Embolism - blood clot 2007   right leg hx of  . Enlarged heart   . GERD (gastroesophageal reflux disease)   . Hypertension   .  Hypothyroidism   . Shortness of breath dyspnea    with exertion  . Sleep apnea    last sleep study over five years  . Thyroid disease    hypothyroidism    Past Surgical History:  Procedure Laterality Date  . CARPAL TUNNEL RELEASE Right   . EYE SURGERY Bilateral    cataract removal   . KNEE SURGERY  2007   right knee  . LIPOMA EXCISION Right 03/28/2019   Procedure: EXCISION SUBCUTANEOUS LIPOMA RIGHT SHOULDER;  Surgeon: Donnie Mesa, MD;  Location: [facility];  Service: General;  Laterality: Right;  . SHOULDER ARTHROSCOPY WITH OPEN ROTATOR CUFF REPAIR AND DISTAL CLAVICLE ACROMINECTOMY Right 08/09/2014   Procedure: SHOULDER ARTHROSCOPY WITH OPEN ROTATOR CUFF REPAIR AND DISTAL CLAVICLE ACROMINECTOMY;  Surgeon: Earlie Server, MD;  Location: [facility];  Service: Orthopedics;  Laterality: Right;  . SINUS SURGERY WITH INSTATRAK     ???    Review of systems negative except as noted in HPI / PMHx or noted below:  Review of Systems  Constitutional: Negative.   HENT: Negative.   Eyes: Negative.   Respiratory: Negative.   Cardiovascular: Negative.   Gastrointestinal: Negative.   Genitourinary: Negative.   Musculoskeletal: Negative.   Skin: Negative.   Neurological: Negative.   Endo/Heme/Allergies: Negative.   Psychiatric/Behavioral: Negative.      Objective:   Vitals:   08/13/19 1349 08/13/19 1412  BP: (!) 150/100 (!) 162/100  Pulse: 76   Resp: 20   Temp: 98.2 F (36.8 C)   SpO2: 94%           Physical Exam Constitutional:      Appearance: She is not diaphoretic.  HENT:     Head: Normocephalic.     Right Ear: Tympanic membrane, ear canal and external ear normal.     Left Ear: Tympanic membrane, ear canal and external ear normal.     Nose: Nose normal. No mucosal edema or rhinorrhea.     Mouth/Throat:     Pharynx: Uvula midline. No oropharyngeal exudate.  Eyes:     Conjunctiva/sclera: Conjunctivae normal.  Neck:     Thyroid: No thyromegaly.     Trachea: Trachea  normal. No tracheal tenderness or tracheal deviation.  Cardiovascular:     Rate and Rhythm: Normal rate and regular rhythm.     Heart sounds: Normal heart sounds, S1 normal and S2 normal. No murmur.  Pulmonary:     Effort: No respiratory distress.     Breath sounds: Normal breath sounds. No stridor. No wheezing or rales.  Lymphadenopathy:     Head:     Right side of head: No tonsillar adenopathy.     Left side of head: No tonsillar adenopathy.  Cervical: No cervical adenopathy.  Skin:    Findings: No erythema or rash.     Nails: There is no clubbing.  Neurological:     Mental Status: She is alert.     Diagnostics:    Spirometry was performed and demonstrated an FEV1 of 1.19 at 52 % of predicted.  Assessment and Plan:   1. Asthma, not well controlled, severe persistent, with acute exacerbation   2. Other allergic rhinitis   3. LPRD (laryngopharyngeal reflux disease)   4. Essential hypertension     1. Breztri - two puffs two times per day (replaces Symbicort)  2. Wear a mask while at work    3. Add Flovent 110 2 inhalations two times per day during increased asthma activity  4. Continue pantoprazole 40mg  twice a day  5. Continue Benralizumab (AUVI-Q 3.0 )   6. Continue nasonex one spray each nostril 1-2 times per day.   7. Continue Proair HFA or Albuterol nebulization if needed.  8. Can add OTC antihistamine - Zyrtec / Allegra / Claritin, and nasal saline  9. Treat hypertension:   A. Continue Losartan   B. Start HCTZ 12.5 - 1 tablet 1 time per day  C. Check BP and follow up with primary doctor  10. Return to clinic in 4 weeks or earlier if problem  We will have Devita use a triple inhaler to replace her Symbicort and I have asked her to make sure she wears a very high quality mask while she is at work being exposed to particulate matter.  As well, she does have hypertension that is not under good control which may be also contributing to her symptoms and I am  going to ask her to use hydrochlorothiazide in addition to her losartan to address this issue.  I am going to see her back in this clinic in 4 weeks to assess her response to this approach and she will contact me during the interval should there be a significant problem.  Allena Katz, MD Allergy / [facility] [place]

## 2019-08-13 NOTE — Patient Instructions (Addendum)
  1. Breztri - two puffs two times per day (replaces Symbicort)  2. Wear a mask while at work    3. Add Flovent 110 2 inhalations two times per day during increased asthma activity  4. Continue pantoprazole 40mg  twice a day  5. Continue Benralizumab (AUVI-Q 3.0 )   6. Continue nasonex one spray each nostril 1-2 times per day.   7. Continue Proair HFA or Albuterol nebulization if needed.  8. Can add OTC antihistamine - Zyrtec / Allegra / Claritin, and nasal saline  9. Treat hypertension:   A. Continue Losartan   B. Start HCTZ 12.5 - 1 tablet 1 time per day  C. Check BP and follow up with primary doctor  10. Return to clinic in 4 weeks or earlier if problem

## 2019-08-14 ENCOUNTER — Encounter: Payer: Self-pay | Admitting: Allergy and Immunology

## 2019-08-15 ENCOUNTER — Encounter: Payer: Self-pay | Admitting: Allergy and Immunology

## 2019-08-15 ENCOUNTER — Telehealth: Payer: Self-pay | Admitting: *Deleted

## 2019-08-15 MED ORDER — FLUTICASONE PROPIONATE 50 MCG/ACT NA SUSP: NASAL | 5 refills | Status: DC

## 2019-08-15 NOTE — Addendum Note (Signed)
Addended by: Chip Boer R on: 08/15/2019 01:44 PM   Modules accepted: Orders

## 2019-08-15 NOTE — Telephone Encounter (Signed)
Pharmacy sent over a fax stating that Mometasone was too expensive and was wondering if Flonase could be sent in instead. Please advise.

## 2019-08-15 NOTE — Telephone Encounter (Signed)
And substitute mometasone with fluticasone with similar prescription structures

## 2019-08-15 NOTE — Telephone Encounter (Signed)
New prescription has been sent in. Called and left a voicemail asking for patient to return call to inform of change in medication.

## 2019-09-04 ENCOUNTER — Other Ambulatory Visit: Payer: Self-pay | Admitting: Allergy and Immunology

## 2019-09-06 ENCOUNTER — Other Ambulatory Visit: Payer: Self-pay

## 2019-09-06 ENCOUNTER — Ambulatory Visit (INDEPENDENT_AMBULATORY_CARE_PROVIDER_SITE_OTHER): Payer: 59 | Admitting: *Deleted

## 2019-09-06 DIAGNOSIS — J4550 Severe persistent asthma, uncomplicated: Secondary | ICD-10-CM | POA: Diagnosis not present

## 2019-10-02 ENCOUNTER — Other Ambulatory Visit: Payer: Self-pay | Admitting: Allergy and Immunology

## 2019-10-30 ENCOUNTER — Ambulatory Visit: Payer: Self-pay | Admitting: Cardiology

## 2019-11-01 ENCOUNTER — Ambulatory Visit: Payer: 59

## 2019-11-08 ENCOUNTER — Other Ambulatory Visit: Payer: Self-pay

## 2019-11-08 ENCOUNTER — Ambulatory Visit (INDEPENDENT_AMBULATORY_CARE_PROVIDER_SITE_OTHER): Payer: 59

## 2019-11-08 DIAGNOSIS — J4550 Severe persistent asthma, uncomplicated: Secondary | ICD-10-CM | POA: Diagnosis not present

## 2019-11-08 NOTE — Progress Notes (Signed)
Patient was accompanied by her spouse during injection appointment in order to receive education on Fasenra Pen Autoinjector. Patient and spouse both expressed understanding regarding home administration and had no further questions or concerns at this time. No reactions during wait period.

## 2019-11-15 ENCOUNTER — Ambulatory Visit: Payer: Self-pay | Admitting: Cardiology

## 2019-11-20 ENCOUNTER — Other Ambulatory Visit: Payer: Self-pay | Admitting: Allergy and Immunology

## 2019-12-29 ENCOUNTER — Other Ambulatory Visit: Payer: Self-pay | Admitting: Allergy and Immunology

## 2020-01-26 ENCOUNTER — Other Ambulatory Visit: Payer: Self-pay | Admitting: Allergy and Immunology

## 2020-01-31 ENCOUNTER — Ambulatory Visit (INDEPENDENT_AMBULATORY_CARE_PROVIDER_SITE_OTHER): Payer: 59 | Admitting: *Deleted

## 2020-01-31 ENCOUNTER — Other Ambulatory Visit: Payer: Self-pay

## 2020-01-31 DIAGNOSIS — J4550 Severe persistent asthma, uncomplicated: Secondary | ICD-10-CM

## 2020-02-23 ENCOUNTER — Other Ambulatory Visit: Payer: Self-pay | Admitting: Allergy and Immunology

## 2020-03-26 ENCOUNTER — Other Ambulatory Visit: Payer: Self-pay

## 2020-03-26 DIAGNOSIS — I10 Essential (primary) hypertension: Secondary | ICD-10-CM | POA: Insufficient documentation

## 2020-03-26 DIAGNOSIS — G5601 Carpal tunnel syndrome, right upper limb: Secondary | ICD-10-CM | POA: Insufficient documentation

## 2020-03-26 DIAGNOSIS — M1990 Unspecified osteoarthritis, unspecified site: Secondary | ICD-10-CM | POA: Insufficient documentation

## 2020-03-26 DIAGNOSIS — E079 Disorder of thyroid, unspecified: Secondary | ICD-10-CM | POA: Insufficient documentation

## 2020-03-26 DIAGNOSIS — I517 Cardiomegaly: Secondary | ICD-10-CM | POA: Insufficient documentation

## 2020-03-27 ENCOUNTER — Other Ambulatory Visit: Payer: Self-pay

## 2020-03-27 ENCOUNTER — Encounter: Payer: Self-pay | Admitting: Cardiology

## 2020-03-27 ENCOUNTER — Ambulatory Visit (INDEPENDENT_AMBULATORY_CARE_PROVIDER_SITE_OTHER): Payer: 59 | Admitting: *Deleted

## 2020-03-27 ENCOUNTER — Ambulatory Visit (INDEPENDENT_AMBULATORY_CARE_PROVIDER_SITE_OTHER): Payer: Managed Care, Other (non HMO) | Admitting: Cardiology

## 2020-03-27 VITALS — BP 120/82 | HR 71 | Ht 61.0 in | Wt 295.0 lb

## 2020-03-27 DIAGNOSIS — I5032 Chronic diastolic (congestive) heart failure: Secondary | ICD-10-CM | POA: Diagnosis not present

## 2020-03-27 DIAGNOSIS — I10 Essential (primary) hypertension: Secondary | ICD-10-CM | POA: Diagnosis not present

## 2020-03-27 DIAGNOSIS — J4550 Severe persistent asthma, uncomplicated: Secondary | ICD-10-CM | POA: Diagnosis not present

## 2020-03-27 DIAGNOSIS — I5030 Unspecified diastolic (congestive) heart failure: Secondary | ICD-10-CM | POA: Insufficient documentation

## 2020-03-27 DIAGNOSIS — I2510 Atherosclerotic heart disease of native coronary artery without angina pectoris: Secondary | ICD-10-CM

## 2020-03-27 NOTE — Patient Instructions (Signed)
Medication Instructions:  Your physician recommends that you continue on your current medications as directed. Please refer to the Current Medication list given to you today.  *If you need a refill on your cardiac medications before your next appointment, please call your pharmacy*   Lab Work: None.    Testing/Procedures: None.   Follow-Up: At [facility], you and your health needs are our priority.  As part of our continuing mission to provide you with exceptional heart care, we have created designated Provider Care Teams.  These Care Teams include your primary Cardiologist (physician) and Advanced Practice Providers (APPs -  Physician Assistants and Nurse Practitioners) who all work together to provide you with the care you need, when you need it.  We recommend signing up for the patient portal called "MyChart".  Sign up information is provided on this After Visit Summary.  MyChart is used to connect with patients for Virtual Visits (Telemedicine).  Patients are able to view lab/test results, encounter notes, upcoming appointments, etc.  Non-urgent messages can be sent to your provider as well.   To learn more about what you can do with MyChart, go to [email or web address].    Your next appointment:   6 month(s)  The format for your next appointment:   In Person  Provider:   Jenne Campus, MD   Other Instructions

## 2020-03-27 NOTE — Progress Notes (Signed)
Cardiology Office Note:    Date:  03/27/2020   ID:  Beth Rodriguez, DOB [birth year], MRN [id number]  PCP:  [name], MD  Cardiologist:  Jenne Campus, MD    Referring MD: [name], MD   Chief Complaint  Patient presents with  . Follow-up  Am doing fine  History of Present Illness:    Beth Rodriguez is a 59 y.o. female with past medical history significant for abnormal stress test in 2018 stress test showing questionable ischemia involving apex completely asymptomatic.  Also morbid obesity, diastolic dysfunction, diastolic congestive heart failure, dyslipidemia.  Comes today to my office for follow-up.  Overall doing well.  Denies have any chest pain tightness squeezing pressure burning chest.  She is back at work however she changed her job before she working very unhealthy environment with a lot of dust now she is happy and she is doing well.  She works during the night and she has no difficulty doing it.  Past Medical History:  Diagnosis Date  . Abnormal stress test 04/18/2017  . Acquired hypothyroidism 07/18/2015   Last Assessment & Plan:  Formatting of this note might be different from the original. Relevant Hx: Course: Daily Update: Today's Plan:she is going to have her TSH updated for her   Electronically signed by: Mayer Camel, NP 07/22/15 2115  . ADRENAL INSUFFICIENCY, HX OF 01/23/2010   Qualifier: Diagnosis of  By: Inda Castle FNP, [place]   . Allergic rhinitis 12/12/2014  . Aortic atherosclerosis ([place]) 02/18/2017   Formatting of this note might be different from the original. Noted on CT scan ([facility]) 02/2017  . Arthritis   . Asthma   . ASTHMA 01/16/2010   Qualifier: Diagnosis of  By: Inda Castle FNP, Melissa S   . Cardiomegaly 01/16/2010   Qualifier: Diagnosis of  By: Inda Castle FNP, Melissa S   . Carpal tunnel syndrome on right   . Coronary artery disease 07/21/2017   With mildly abnormal stress test showing small defect involving apex  indicating distal LAD disease  Formatting of this note might be different from the original. Overview:  With mildly abnormal stress test showing small defect involving apex indicating distal LAD disease  . COUGH, CHRONIC 01/23/2010   Qualifier: Diagnosis of  By: Inda Castle FNP, [place]   . DEEP VENOUS THROMBOPHLEBITIS, LEG, RIGHT, HX OF 01/16/2010   Qualifier: Diagnosis of  By: Inda Castle FNP, [place]   . Degeneration of lumbar intervertebral disc 07/18/2015   Last Assessment & Plan:  Formatting of this note might be different from the original. Relevant Hx: Course: Daily Update: Today's Plan:longstanding for her and she is still working and on her feet some days being harder for her than others, she is to again work on her weight to help with this  Electronically signed by: Mayer Camel, NP 07/22/15 2113  . DIASTOLIC DYSFUNCTION 97/67/3419   Annotation: grade 2 per 2-D echo 01/26/10 Qualifier: Diagnosis of  By: Inda Castle FNP, [place]   . Diverticulosis 10/03/2017  . DYSPNEA ON EXERTION 01/16/2010   Qualifier: Diagnosis of  By: Inda Castle FNP, [place]   . Dyspnea on exertion 03/17/2017  . Edema 01/16/2010   Qualifier: Diagnosis of  By: Inda Castle FNP, Melissa S   . Embolism - blood clot 2007   right leg hx of  . Enlarged heart   . Essential hypertension 01/29/2010   Qualifier: Diagnosis of  By: Ronnald Ramp RN, Crystal    Last Assessment & Plan:  Formatting of this note might be different from the original. Relevant Hx: Course: Daily Update: Today's Plan:stable for her and will follow this for her  Electronically signed by: Mayer Camel, NP 07/22/15 2113  . Gastroesophageal reflux disease without esophagitis 07/18/2015   Last Assessment & Plan:  Formatting of this note might be different from the original. Relevant Hx: Course: Daily Update: Today's Plan:this is stable for her at this time and will follow her  Electronically signed by: Mayer Camel, NP 07/22/15  2116  . GERD (gastroesophageal reflux disease)   . GERD, SEVERE 02/01/2010   Qualifier: Diagnosis of  By: Joya Gaskins MD, Burnett Harry   . Hypertension   . Hypothyroidism   . HYPOTHYROIDISM 01/16/2010   Qualifier: Diagnosis of  By: Inda Castle FNP, [place]   . LIPOMA 05/15/2010   Qualifier: Diagnosis of  By: Inda Castle FNP, [place]   . Lumbar spondylosis 02/18/2017   Formatting of this note might be different from the original. Noted on ([facility]) CT scan 02/2017  . Mixed hyperlipidemia 07/18/2015   Last Assessment & Plan:  Formatting of this note might be different from the original. Relevant Hx: Course: Daily Update: Today's Plan:she is going to have her lipids updated fasting  Electronically signed by: Mayer Camel, NP 07/22/15 2112  . OBESITY, MORBID 01/16/2010   Qualifier: Diagnosis of  By: Inda Castle FNP, [place]   Formatting of this note might be different from the original. Overview:  Qualifier: Diagnosis of  By: Inda Castle FNP, Melissa S  . Obstructive sleep apnea syndrome 01/16/2010   last sleep study over five years Formatting of this note might be different from the original. Overview:  Qualifier: Diagnosis of  By: Inda Castle FNP, [place]:  Formatting of this note might be different from the original. Relevant Hx: Course: Daily Update: Today's Plan:she cannot wear the mask for this and have discussed with her how this can affect her by not doing so    . Osteopenia of multiple sites 06/02/2017   Formatting of this note might be different from the original. -2.3  . Other acute sinusitis 01/29/2010   Qualifier: Diagnosis of  By: Joya Gaskins MD, Burnett Harry   . Prediabetes 07/22/2015   Last Assessment & Plan:  Formatting of this note might be different from the original. Relevant Hx: Course: Daily Update: Today's Plan:would update her HGBA1C for her and with her use of the steroids this can be elevated for her  Electronically signed by: Mayer Camel, NP 07/22/15 2109  . Right rotator cuff tear 08/09/2014  . Severe persistent asthma 01/09/2015   [name] presents today with a one week history of wheezing and DOE and slight cough requiring her to use a SABA a few times per day. In addition, has nasal congestion and green rhinorrhea. She has been off her prednisone for over two months and was doing very well until this flare up. She continue on her Nucala and her large collection of medication for inflammation and reflux. Her reflux is under c  . Shortness of breath dyspnea    with exertion  . Sleep apnea    last sleep study over five years  . SLEEP APNEA, OBSTRUCTIVE 01/16/2010   Qualifier: Diagnosis of  By: Inda Castle FNP, [place]   . Thyroid disease    hypothyroidism  . Vitamin D deficiency 02/02/2010   Qualifier: Diagnosis of  By: Inda Castle FNP, [place]   Formatting of this note  might be different from the original. Overview:  Qualifier: Diagnosis of  By: Inda Castle FNP, [place]    Past Surgical History:  Procedure Laterality Date  . CARPAL TUNNEL RELEASE Right   . EYE SURGERY Bilateral    cataract removal   . KNEE SURGERY  2007   right knee  . LIPOMA EXCISION Right 03/28/2019   Procedure: EXCISION SUBCUTANEOUS LIPOMA RIGHT SHOULDER;  Surgeon: Donnie Mesa, MD;  Location: [facility];  Service: General;  Laterality: Right;  . SHOULDER ARTHROSCOPY WITH OPEN ROTATOR CUFF REPAIR AND DISTAL CLAVICLE ACROMINECTOMY Right 08/09/2014   Procedure: SHOULDER ARTHROSCOPY WITH OPEN ROTATOR CUFF REPAIR AND DISTAL CLAVICLE ACROMINECTOMY;  Surgeon: Earlie Server, MD;  Location: [facility];  Service: Orthopedics;  Laterality: Right;  . SINUS SURGERY WITH INSTATRAK     ???    Current Medications: Current Meds  Medication Sig  . albuterol (PROVENTIL) (2.5 MG/3ML) 0.083% nebulizer solution Take 2.5 mg by nebulization every 6 (six) hours as needed for wheezing or shortness of breath.  Marland Kitchen albuterol (VENTOLIN HFA) 108 (90 Base) MCG/ACT inhaler  INHALE TWO PUFFS EVERY FOUR TO SIX HOURS AS NEEDED FOR COUGH OR WHEEZE.  Marland Kitchen aspirin EC 81 MG tablet Take 81 mg by mouth daily.   Marland Kitchen atorvastatin (LIPITOR) 10 MG tablet TAKE 1 TABLET BY MOUTH AT 6 PM  . AUVI-Q 0.3 MG/0.3ML SOAJ injection Use as directed for life-threatening allergic reaction.  . Budeson-Glycopyrrol-Formoterol (BREZTRI AEROSPHERE) 160-9-4.8 MCG/ACT AERO Inhale two puffs twice daily to prevent cough or wheeze.  Rinse, gargle, and spit after use.  . Cranberry-Vitamin C-Probiotic (AZO CRANBERRY PO) Take by mouth daily.  Marland Kitchen FLOVENT HFA 110 MCG/ACT inhaler INHALE TWO PUFFS TWICE DAILY. RINSE MOUTH AFTER USE.  . hydrochlorothiazide (MICROZIDE) 12.5 MG capsule TAKE 1 CAPSULE BY MOUTH EVERY DAY  . ibandronate (BONIVA) 150 MG tablet Take 1 tablet by mouth every 28 (twenty-eight) days.  Marland Kitchen levofloxacin (LEVAQUIN) 500 MG tablet Take 500 mg by mouth daily.  Marland Kitchen levothyroxine (SYNTHROID, LEVOTHROID) 88 MCG tablet Take 88 mcg by mouth daily before breakfast.   . loratadine (CLARITIN) 10 MG tablet Take 10 mg by mouth daily as needed for allergies. Reported on 07/21/2015  . losartan (COZAAR) 100 MG tablet Take 100 mg by mouth daily.  . mometasone (NASONEX) 50 MCG/ACT nasal spray Use one spray in each nostril one to two times per days.  . pantoprazole (PROTONIX) 40 MG tablet Take 40 mg by mouth 2 (two) times daily.  Marland Kitchen Propylene Glycol (SYSTANE COMPLETE) 0.6 % SOLN Place 1 drop into both eyes daily as needed (dry eyes).   Current Facility-Administered Medications for the 03/27/20 encounter (Office Visit) with Park Liter, MD  Medication  . Benralizumab SOSY 30 mg     Allergies:   Penicillins, Theophyllines, and Theophylline   Social History   Socioeconomic History  . Marital status: Married    Spouse name: Not on file  . Number of children: 1  . Years of education: Not on file  . Highest education level: Not on file  Occupational History  . Occupation: PRODUCTION ASSEMBLY    Employer:  [employer]  Tobacco Use  . Smoking status: Never Smoker  . Smokeless tobacco: Never Used  Vaping Use  . Vaping Use: Never used  Substance and Sexual Activity  . Alcohol use: No  . Drug use: No  . Sexual activity: Not on file  Other Topics Concern  . Not on file  Social History Narrative  . Not  on file   Social Determinants of Health   Financial Resource Strain: Not on file  Food Insecurity: Not on file  Transportation Needs: Not on file  Physical Activity: Not on file  Stress: Not on file  Social Connections: Not on file     Family History: The patient's family history includes Cancer in her sister; Carpal tunnel syndrome in her daughter and sister; Diabetes in her sister; Hyperlipidemia in her sister; Hypertension in her brother, daughter, mother, and sister. ROS:   Please see the history of present illness.    All 14 point review of systems negative except as described per history of present illness  EKGs/Labs/Other Studies Reviewed:      Recent Labs: No results found for requested labs within last 8760 hours.  Recent Lipid Panel    Component Value Date/Time   CHOL 137 07/21/2017 1015   TRIG 44 07/21/2017 1015   HDL 62 07/21/2017 1015   CHOLHDL 2.2 07/21/2017 1015   LDLCALC 66 07/21/2017 1015    Physical Exam:    VS:  BP 120/82 (BP Location: Right Arm, Patient Position: Sitting)   Pulse 71   Ht 5\' 1"  (1.549 m)   Wt 295 lb (133.8 kg)   SpO2 99%   BMI 55.74 kg/m     Wt Readings from Last 3 Encounters:  03/27/20 295 lb (133.8 kg)  04/09/19 285 lb 6.4 oz (129.5 kg)  03/28/19 282 lb (127.9 kg)     GEN:  Well nourished, well developed in no acute distress HEENT: Normal NECK: No JVD; No carotid bruits LYMPHATICS: No lymphadenopathy CARDIAC: RRR, no murmurs, no rubs, no gallops RESPIRATORY:  Clear to auscultation without rales, wheezing or rhonchi  ABDOMEN: Soft, non-tender, non-distended MUSCULOSKELETAL:  No edema; No deformity  SKIN: Warm and  dry LOWER EXTREMITIES: no swelling NEUROLOGIC:  Alert and oriented x 3 PSYCHIATRIC:  Normal affect   ASSESSMENT:    1. Coronary artery disease involving native coronary artery of native heart without angina pectoris   2. Essential hypertension   3. Chronic diastolic congestive heart failure ([place])   4. OBESITY, MORBID    PLAN:    In order of problems listed above:  1. Coronary artery disease: She does have mildly abnormal stress test but is completely asymptomatic and prefers medical therapy.  She is on antiplatelet agent already in form of aspirin which I will continue. 2. Essential hypertension, blood pressure today 120/82 we will continue present management. 3. Chronic diastolic congestive heart failure appears to be compensated we will continue present management. 4. Obesity which is morbid obesity still concerns.  She understands this problem try to work on her weight loss. 5. Dyslipidemia: I did review fasting lipid profile done by primary care physician 2 months ago showing LDL 78 HDL 69.  This is on moderate intensity statin form of Lipitor 10 which I will continue.   Medication Adjustments/Labs and Tests Ordered: Current medicines are reviewed at length with the patient today.  Concerns regarding medicines are outlined above.  No orders of the defined types were placed in this encounter.  Medication changes: No orders of the defined types were placed in this encounter.   Signed, Park Liter, MD, [facility] 03/27/2020 11:34 AM    [place]

## 2020-03-29 ENCOUNTER — Other Ambulatory Visit: Payer: Self-pay | Admitting: Allergy and Immunology

## 2020-04-04 ENCOUNTER — Other Ambulatory Visit: Payer: Self-pay | Admitting: Allergy and Immunology

## 2020-04-10 ENCOUNTER — Other Ambulatory Visit: Payer: Self-pay | Admitting: *Deleted

## 2020-04-10 MED ORDER — BREZTRI AEROSPHERE 160-9-4.8 MCG/ACT IN AERO: INHALATION_SPRAY | RESPIRATORY_TRACT | 0 refills | Status: DC

## 2020-04-24 ENCOUNTER — Other Ambulatory Visit: Payer: Self-pay | Admitting: Allergy and Immunology

## 2020-04-28 ENCOUNTER — Ambulatory Visit: Payer: 59 | Admitting: Allergy and Immunology

## 2020-05-12 ENCOUNTER — Ambulatory Visit (INDEPENDENT_AMBULATORY_CARE_PROVIDER_SITE_OTHER): Payer: Managed Care, Other (non HMO) | Admitting: Allergy and Immunology

## 2020-05-12 ENCOUNTER — Other Ambulatory Visit: Payer: Self-pay

## 2020-05-12 ENCOUNTER — Telehealth: Payer: Self-pay | Admitting: Allergy and Immunology

## 2020-05-12 ENCOUNTER — Encounter: Payer: Self-pay | Admitting: Allergy and Immunology

## 2020-05-12 VITALS — BP 130/80 | HR 76 | Resp 14

## 2020-05-12 DIAGNOSIS — J3089 Other allergic rhinitis: Secondary | ICD-10-CM | POA: Diagnosis not present

## 2020-05-12 DIAGNOSIS — K219 Gastro-esophageal reflux disease without esophagitis: Secondary | ICD-10-CM | POA: Diagnosis not present

## 2020-05-12 DIAGNOSIS — J4550 Severe persistent asthma, uncomplicated: Secondary | ICD-10-CM | POA: Diagnosis not present

## 2020-05-12 MED ORDER — AUVI-Q 0.3 MG/0.3ML IJ SOAJ: INTRAMUSCULAR | 3 refills | Status: DC

## 2020-05-12 NOTE — Telephone Encounter (Signed)
Will obtain approval and reach out to patient with submit info

## 2020-05-12 NOTE — Telephone Encounter (Signed)
Beth Rodriguez has switched insurances.  She now has [place].  Her chart is currently showing she has both [employer] and [place] but [name] states that the [name] ended back in December.

## 2020-05-12 NOTE — Patient Instructions (Addendum)
  1. Continue Breztri - two puffs two times per day    2. Add Flovent 110 2 inhalations two times per day during increased asthma activity  3. INCREASE pantoprazole 40mg  twice a day  4. Continue Benralizumab injections  5. Continue nasonex one spray each nostril 1-2 times per day.   6. Continue Proair HFA or Albuterol nebulization if needed.  7. Can add OTC antihistamine - Zyrtec / Allegra / Claritin, and nasal saline  8. Return to clinic in 6 months or earlier if problem

## 2020-05-12 NOTE — Progress Notes (Signed)
[facility] [place] [facility] [place]   Follow-up Note  Referring Provider: [name], MD Primary Provider: [name], MD Date of Office Visit: 05/12/2020  Subjective:   Beth Rodriguez (DOB: 04-Nov-1960) is a 60 y.o. female who returns to the [facility] on 05/12/2020 in re-evaluation of the following:  HPI: Vicy returns to this clinic in evaluation of severe asthma treated with benralizumab, allergic rhinitis, and a history of chronic sinusitis and LPR.  Her last visit to this clinic was 13 Aug 2019.  During the interval she required a systemic steroid and an antibiotic on 1 occasion in November 2021 for a "cold".  Other than that one episode she has not required any additional systemic steroid or antibiotic.  Her requirement for short acting bronchodilator is daily as she uses a short acting bronchodilator every morning before she utilizes her other inhaler medications.  Otherwise, she does not really have a need for short acting bronchodilator and she has been able to maintain work on a 40-hour work week without much difficulty.  She is now working in a [place] where there is very little particulate matter in the air.  She continues on a triple inhaler and she also adds Flovent on most days and continues on benralizumab.  She has not really been having any problems with her nose.  She does have some phlegm in her throat that causes her to cough on occasion.  She does have a history of reflux induced respiratory disease and she is slowly consolidated her proton pump inhibitor to just 1 time per day.  Interestingly, she has had 2 episodes of laryngeal spasm that wakes her up at nighttime over the course of the past 2 months.  She is not sure if she had a reflux event with those episodes.  She will not be receiving the Covid vaccine but she did receive the flu vaccine.  Allergies as of 05/12/2020      Reactions   Penicillins Hives, Other (See  Comments)   Did it involve swelling of the face/tongue/throat, SOB, or low BP? No Did it involve sudden or severe rash/hives, skin peeling, or any reaction on the inside of your mouth or nose?    #  #  YES  #  #  Did you need to seek medical attention at a hospital or doctor's office?    #  #  YES  #  #  When did it last happen?2019   Theophyllines Other (See Comments)   HEADACHE Severe headaches   Theophylline Other (See Comments)   REACTION: headaches      Medication List    albuterol (2.5 MG/3ML) 0.083% nebulizer solution Commonly known as: PROVENTIL Take 2.5 mg by nebulization every 6 (six) hours as needed for wheezing or shortness of breath.   albuterol 108 (90 Base) MCG/ACT inhaler Commonly known as: VENTOLIN HFA INHALE TWO PUFFS EVERY FOUR TO SIX HOURS AS NEEDED FOR COUGH OR WHEEZE.   aspirin EC 81 MG tablet Take 81 mg by mouth daily.   atorvastatin 10 MG tablet Commonly known as: LIPITOR TAKE 1 TABLET BY MOUTH AT 6 PM   Breztri Aerosphere 160-9-4.8 MCG/ACT Aero Generic drug: Budeson-Glycopyrrol-Formoterol Inhale two puffs twice daily to prevent cough or wheeze.  Rinse, gargle, and spit after use.   Flovent HFA 110 MCG/ACT inhaler Generic drug: fluticasone INHALE TWO PUFFS TWICE DAILY. RINSE MOUTH AFTER USE.   hydrochlorothiazide 12.5 MG capsule Commonly  known as: MICROZIDE TAKE 1 CAPSULE BY MOUTH EVERY DAY   ibandronate 150 MG tablet Commonly known as: BONIVA Take 1 tablet by mouth every 28 (twenty-eight) days.   levothyroxine 88 MCG tablet Commonly known as: SYNTHROID Take 88 mcg by mouth daily before breakfast.   loratadine 10 MG tablet Commonly known as: CLARITIN Take 10 mg by mouth daily as needed for allergies. Reported on 07/21/2015   losartan 100 MG tablet Commonly known as: COZAAR Take 100 mg by mouth daily.   mometasone 50 MCG/ACT nasal spray Commonly known as: Nasonex Use one spray in each nostril one to two times per days.    pantoprazole 40 MG tablet Commonly known as: PROTONIX Take 40 mg by mouth 2 (two) times daily.   Systane Complete 0.6 % Soln Generic drug: Propylene Glycol Place 1 drop into both eyes daily as needed (dry eyes).       Past Medical History:  Diagnosis Date   Abnormal stress test 04/18/2017   Acquired hypothyroidism 07/18/2015   Last Assessment & Plan:  Formatting of this note might be different from the original. Relevant Hx: Course: Daily Update: Today's Plan:she is going to have her TSH updated for her   Electronically signed by: Mayer Camel, NP 07/22/15 2115   ADRENAL INSUFFICIENCY, HX OF 01/23/2010   Qualifier: Diagnosis of  By: Inda Castle FNP, Melissa S    Allergic rhinitis 12/12/2014   Aortic atherosclerosis ([place]) 02/18/2017   Formatting of this note might be different from the original. Noted on CT scan ([facility]) 02/2017   Arthritis    Asthma    ASTHMA 01/16/2010   Qualifier: Diagnosis of  By: Inda Castle FNP, Melissa S    Cardiomegaly 01/16/2010   Qualifier: Diagnosis of  By: Inda Castle FNP, Melissa S    Carpal tunnel syndrome on right    Coronary artery disease 07/21/2017   With mildly abnormal stress test showing small defect involving apex indicating distal LAD disease  Formatting of this note might be different from the original. Overview:  With mildly abnormal stress test showing small defect involving apex indicating distal LAD disease   COUGH, CHRONIC 01/23/2010   Qualifier: Diagnosis of  By: Inda Castle FNP, Melissa S    DEEP VENOUS THROMBOPHLEBITIS, LEG, RIGHT, HX OF 01/16/2010   Qualifier: Diagnosis of  By: Inda Castle FNP, Melissa S    Degeneration of lumbar intervertebral disc 07/18/2015   Last Assessment & Plan:  Formatting of this note might be different from the original. Relevant Hx: Course: Daily Update: Today's Plan:longstanding for her and she is still working and on her feet some days being harder for her than others, she is to  again work on her weight to help with this  Electronically signed by: Mayer Camel, NP 01/74/94 4967   DIASTOLIC DYSFUNCTION 59/16/3846   Annotation: grade 2 per 2-D echo 01/26/10 Qualifier: Diagnosis of  By: Inda Castle FNP, Melissa S    Diverticulosis 10/03/2017   DYSPNEA ON EXERTION 01/16/2010   Qualifier: Diagnosis of  By: Inda Castle FNP, Melissa S    Dyspnea on exertion 03/17/2017   Edema 01/16/2010   Qualifier: Diagnosis of  By: Inda Castle FNP, Melissa S    Embolism - blood clot 2007   right leg hx of   Enlarged heart    Essential hypertension 01/29/2010   Qualifier: Diagnosis of  By: Ronnald Ramp RN, Crystal    Last Assessment & Plan:  Formatting of this note might be different from the original. Relevant Hx: Course: Daily  Update: Today's Plan:stable for her and will follow this for her  Electronically signed by: Mayer Camel, NP 07/22/15 2113   Gastroesophageal reflux disease without esophagitis 07/18/2015   Last Assessment & Plan:  Formatting of this note might be different from the original. Relevant Hx: Course: Daily Update: Today's Plan:this is stable for her at this time and will follow her  Electronically signed by: Mayer Camel, NP 07/22/15 2116   GERD (gastroesophageal reflux disease)    GERD, SEVERE 02/01/2010   Qualifier: Diagnosis of  By: Joya Gaskins MD, Burnett Harry    Hypertension    Hypothyroidism    HYPOTHYROIDISM 01/16/2010   Qualifier: Diagnosis of  By: Inda Castle FNP, Melissa S    LIPOMA 05/15/2010   Qualifier: Diagnosis of  By: Inda Castle FNP, Melissa S    Lumbar spondylosis 02/18/2017   Formatting of this note might be different from the original. Noted on ([facility]) CT scan 02/2017   Mixed hyperlipidemia 07/18/2015   Last Assessment & Plan:  Formatting of this note might be different from the original. Relevant Hx: Course: Daily Update: Today's Plan:she is going to have her lipids updated fasting  Electronically signed by:  Mayer Camel, NP 07/22/15 2112   OBESITY, MORBID 01/16/2010   Qualifier: Diagnosis of  By: Inda Castle FNP, [place]   Formatting of this note might be different from the original. Overview:  Qualifier: Diagnosis of  By: Inda Castle FNP, Melissa S   Obstructive sleep apnea syndrome 01/16/2010   last sleep study over five years Formatting of this note might be different from the original. Overview:  Qualifier: Diagnosis of  By: Inda Castle FNP, [place]:  Formatting of this note might be different from the original. Relevant Hx: Course: Daily Update: Today's Plan:she cannot wear the mask for this and have discussed with her how this can affect her by not doing so     Osteopenia of multiple sites 06/02/2017   Formatting of this note might be different from the original. -2.3   Other acute sinusitis 01/29/2010   Qualifier: Diagnosis of  By: Joya Gaskins MD, Burnett Harry    Prediabetes 07/22/2015   Last Assessment & Plan:  Formatting of this note might be different from the original. Relevant Hx: Course: Daily Update: Today's Plan:would update her HGBA1C for her and with her use of the steroids this can be elevated for her  Electronically signed by: Mayer Camel, NP 07/22/15 2109   Right rotator cuff tear 08/09/2014   Severe persistent asthma 01/09/2015   Demyiah presents today with a one week history of wheezing and DOE and slight cough requiring her to use a SABA a few times per day. In addition, has nasal congestion and green rhinorrhea. She has been off her prednisone for over two months and was doing very well until this flare up. She continue on her Nucala and her large collection of medication for inflammation and reflux. Her reflux is under c   Shortness of breath dyspnea    with exertion   Sleep apnea    last sleep study over five years   SLEEP APNEA, OBSTRUCTIVE 01/16/2010   Qualifier: Diagnosis of  By: Inda Castle FNP, Melissa S    Thyroid disease     hypothyroidism   Vitamin D deficiency 02/02/2010   Qualifier: Diagnosis of  By: Inda Castle FNP, [place]   Formatting of this note might be different from the original. Overview:  Qualifier: Diagnosis of  By: Inda Castle  FNP, [place]    Past Surgical History:  Procedure Laterality Date   CARPAL TUNNEL RELEASE Right    EYE SURGERY Bilateral    cataract removal    KNEE SURGERY  2007   right knee   LIPOMA EXCISION Right 03/28/2019   Procedure: EXCISION SUBCUTANEOUS LIPOMA RIGHT SHOULDER;  Surgeon: Donnie Mesa, MD;  Location: [facility];  Service: General;  Laterality: Right;   SHOULDER ARTHROSCOPY WITH OPEN ROTATOR CUFF REPAIR AND DISTAL CLAVICLE ACROMINECTOMY Right 08/09/2014   Procedure: SHOULDER ARTHROSCOPY WITH OPEN ROTATOR CUFF REPAIR AND DISTAL CLAVICLE [place];  Surgeon: Earlie Server, MD;  Location: [facility];  Service: Orthopedics;  Laterality: Right;   SINUS SURGERY WITH INSTATRAK     ???    Review of systems negative except as noted in HPI / PMHx or noted below:  Review of Systems  Constitutional: Negative.   HENT: Negative.   Eyes: Negative.   Respiratory: Negative.   Cardiovascular: Negative.   Gastrointestinal: Negative.   Genitourinary: Negative.   Musculoskeletal: Negative.   Skin: Negative.   Neurological: Negative.   Endo/Heme/Allergies: Negative.   Psychiatric/Behavioral: Negative.      Objective:   Vitals:   05/12/20 1201  BP: 130/80  Pulse: 76  Resp: 14  SpO2: 95%          Physical Exam Constitutional:      Appearance: She is not diaphoretic.  HENT:     Head: Normocephalic.     Right Ear: Tympanic membrane, ear canal and external ear normal.     Left Ear: Tympanic membrane, ear canal and external ear normal.     Nose: Nose normal. No mucosal edema or rhinorrhea.     Mouth/Throat:     Mouth: Oropharynx is clear and moist and mucous membranes are normal.     Pharynx: Uvula midline. No oropharyngeal exudate.  Eyes:      Conjunctiva/sclera: Conjunctivae normal.  Neck:     Thyroid: No thyromegaly.     Trachea: Trachea normal. No tracheal tenderness or tracheal deviation.  Cardiovascular:     Rate and Rhythm: Normal rate and regular rhythm.     Heart sounds: Normal heart sounds, S1 normal and S2 normal. No murmur heard.   Pulmonary:     Effort: No respiratory distress.     Breath sounds: Normal breath sounds. No stridor. No wheezing or rales.  Musculoskeletal:        General: No edema.  Lymphadenopathy:     Head:     Right side of head: No tonsillar adenopathy.     Left side of head: No tonsillar adenopathy.     Cervical: No cervical adenopathy.  Skin:    Findings: No erythema or rash.     Nails: There is no clubbing.  Neurological:     Mental Status: She is alert.     Diagnostics:    Spirometry was performed and demonstrated an FEV1 of 2.15 at 93 % of predicted.  Assessment and Plan:   1. Asthma, severe persistent, well-controlled   2. Other allergic rhinitis   3. LPRD (laryngopharyngeal reflux disease)     1. Continue Breztri - two puffs two times per day    2. Add Flovent 110 2 inhalations two times per day during increased asthma activity  3. INCREASE pantoprazole 40mg  twice a day  4. Continue Benralizumab injections  5. Continue nasonex one spray each nostril 1-2 times per day.   6. Continue Proair HFA or Albuterol nebulization if needed.  7. Can add OTC antihistamine - Zyrtec / Allegra / Claritin, and nasal saline  8. Return to clinic in 6 months or earlier if problem  Lynett appears to have okay control of regarding her respiratory tract inflammatory condition but her reflux induced respiratory disease with 2 episodes of laryngeal spasm appears to require a little bit more attention and we will increase her pantoprazole to 40 mg twice a day on a consistent basis.  Assuming she does well with the plan noted above I will see her back in his clinic in 6 months or earlier if  there is a problem.  If she has recurrent laryngeal spasm in the face of this therapy then she will definitely require further evaluation.  Allena Katz, MD Allergy / Immun[facility]

## 2020-05-13 ENCOUNTER — Encounter: Payer: Self-pay | Admitting: Allergy and Immunology

## 2020-05-21 ENCOUNTER — Telehealth: Payer: Self-pay | Admitting: Allergy and Immunology

## 2020-05-21 ENCOUNTER — Other Ambulatory Visit: Payer: Self-pay | Admitting: Allergy and Immunology

## 2020-05-21 NOTE — Telephone Encounter (Signed)
Beth Rodriguez called in and just wanted to let Dr. Neldon Mc know she tested positive for Covid.  She was tested on Monday and found out today.  She was informed that Dr. Neldon Mc was out of the office and she said that was fine she just wanted to let him know.  Warda stated her husband was getting tested today and she would call back with his results.

## 2020-06-05 ENCOUNTER — Ambulatory Visit (INDEPENDENT_AMBULATORY_CARE_PROVIDER_SITE_OTHER): Payer: Managed Care, Other (non HMO) | Admitting: *Deleted

## 2020-06-05 ENCOUNTER — Other Ambulatory Visit: Payer: Self-pay

## 2020-06-05 DIAGNOSIS — J4550 Severe persistent asthma, uncomplicated: Secondary | ICD-10-CM | POA: Diagnosis not present

## 2020-06-26 ENCOUNTER — Other Ambulatory Visit: Payer: Self-pay | Admitting: Cardiology

## 2020-07-31 ENCOUNTER — Ambulatory Visit (INDEPENDENT_AMBULATORY_CARE_PROVIDER_SITE_OTHER): Payer: Managed Care, Other (non HMO)

## 2020-07-31 ENCOUNTER — Other Ambulatory Visit: Payer: Self-pay

## 2020-07-31 DIAGNOSIS — J4550 Severe persistent asthma, uncomplicated: Secondary | ICD-10-CM | POA: Diagnosis not present

## 2020-08-22 ENCOUNTER — Other Ambulatory Visit: Payer: Self-pay | Admitting: Allergy and Immunology

## 2020-08-25 ENCOUNTER — Telehealth: Payer: Self-pay | Admitting: Allergy and Immunology

## 2020-08-25 NOTE — Telephone Encounter (Signed)
Dr. Neldon Mc called in levalbuterol for this patient over the weekend. She said this is new to her and wants to talk to someone about this. If you call her after 10:30, call her cell phone # [phone].

## 2020-08-25 NOTE — Telephone Encounter (Signed)
Left a message for Beth Rodriguez to call back.

## 2020-08-27 NOTE — Telephone Encounter (Signed)
Spoke with Kyliegh about the levalbuterol inhaler and explained the differences in that and albuterol. She had no further questions.

## 2020-09-08 ENCOUNTER — Other Ambulatory Visit: Payer: Self-pay | Admitting: Allergy and Immunology

## 2020-09-25 ENCOUNTER — Ambulatory Visit (INDEPENDENT_AMBULATORY_CARE_PROVIDER_SITE_OTHER): Payer: Managed Care, Other (non HMO) | Admitting: *Deleted

## 2020-09-25 ENCOUNTER — Other Ambulatory Visit: Payer: Self-pay

## 2020-09-25 ENCOUNTER — Ambulatory Visit: Payer: 59 | Admitting: Cardiology

## 2020-09-25 DIAGNOSIS — J4550 Severe persistent asthma, uncomplicated: Secondary | ICD-10-CM

## 2020-11-20 ENCOUNTER — Other Ambulatory Visit: Payer: Self-pay

## 2020-11-20 ENCOUNTER — Ambulatory Visit (INDEPENDENT_AMBULATORY_CARE_PROVIDER_SITE_OTHER): Payer: Managed Care, Other (non HMO) | Admitting: *Deleted

## 2020-11-20 DIAGNOSIS — J4550 Severe persistent asthma, uncomplicated: Secondary | ICD-10-CM | POA: Diagnosis not present

## 2020-12-10 ENCOUNTER — Telehealth: Payer: Self-pay | Admitting: Allergy and Immunology

## 2020-12-10 NOTE — Telephone Encounter (Signed)
Patient is requesting a refill on Ventolin, sent to [facility] in [place].

## 2020-12-11 ENCOUNTER — Other Ambulatory Visit: Payer: Self-pay | Admitting: *Deleted

## 2020-12-11 MED ORDER — ALBUTEROL SULFATE HFA 108 (90 BASE) MCG/ACT IN AERS: 2.0000 | INHALATION_SPRAY | Freq: Four times a day (QID) | RESPIRATORY_TRACT | 0 refills | Status: DC | PRN

## 2020-12-11 NOTE — Telephone Encounter (Signed)
RX sent to [facility]

## 2020-12-16 ENCOUNTER — Other Ambulatory Visit: Payer: Self-pay | Admitting: *Deleted

## 2020-12-16 MED ORDER — ALBUTEROL SULFATE HFA 108 (90 BASE) MCG/ACT IN AERS: INHALATION_SPRAY | RESPIRATORY_TRACT | 0 refills | Status: DC

## 2020-12-22 ENCOUNTER — Ambulatory Visit: Payer: Managed Care, Other (non HMO) | Admitting: Allergy and Immunology

## 2020-12-22 ENCOUNTER — Encounter: Payer: Self-pay | Admitting: Allergy and Immunology

## 2020-12-22 ENCOUNTER — Other Ambulatory Visit: Payer: Self-pay

## 2020-12-22 VITALS — BP 126/82 | HR 76 | Resp 16 | Ht 61.0 in | Wt 284.2 lb

## 2020-12-22 DIAGNOSIS — K219 Gastro-esophageal reflux disease without esophagitis: Secondary | ICD-10-CM | POA: Diagnosis not present

## 2020-12-22 DIAGNOSIS — U099 Post covid-19 condition, unspecified: Secondary | ICD-10-CM

## 2020-12-22 DIAGNOSIS — J3089 Other allergic rhinitis: Secondary | ICD-10-CM

## 2020-12-22 DIAGNOSIS — J4550 Severe persistent asthma, uncomplicated: Secondary | ICD-10-CM | POA: Diagnosis not present

## 2020-12-22 MED ORDER — ALBUTEROL SULFATE HFA 108 (90 BASE) MCG/ACT IN AERS: INHALATION_SPRAY | RESPIRATORY_TRACT | 1 refills | Status: DC

## 2020-12-22 MED ORDER — AZITHROMYCIN 250 MG PO TABS: 250.0000 mg | ORAL_TABLET | Freq: Every day | ORAL | 0 refills | Status: AC

## 2020-12-22 NOTE — Patient Instructions (Addendum)
  1. Continue Breztri - two puffs two times per day    2. Add Flovent 110 2 inhalations two times per day during increased asthma activity  3. Continue pantoprazole '40mg'$  twice a day  4. Continue Benralizumab injections  5. Continue nasonex one spray each nostril 1-2 times per day.   6. Continue Proair HFA or Albuterol nebulization if needed.  7. Can add OTC antihistamine - Zyrtec / Allegra / Claritin, and nasal saline  8.  For this recent episode use the following:   A.  Azithromycin 250 mg -1 tablet 1 time per day for 10 days  B.  Prednisone 10 mg - 2 tabs daily x 10 days, then 1 tab daily x 10 days  9. Consider changing Benralizumab to Tezepelumab if not doing well.  10. Return to clinic in 6 months or earlier if problem  11.  Obtain fall flu vaccine

## 2020-12-22 NOTE — Progress Notes (Signed)
[facility] - [place] - [facility] Rodriguez   Follow-up Note  Referring Provider: [name], MD Primary Provider: [name], MD Date of Office Visit: 12/22/2020  Subjective:   Beth Rodriguez (DOB: 1960/06/27) is a 60 y.o. female who returns to the [facility] [place] on 12/22/2020 in re-evaluation of the following:  HPI: Beth Rodriguez returns to this clinic in evaluation of severe asthma, allergic rhinitis, history of chronic sinusitis and LPR.  Her last visit to this clinic was 12 May 2020.  She contracted COVID with upper and lower respiratory tract involvement at the very beginning of spring 2022 and ever since then she has been having a lot of problems with coughing and using her bronchodilator 2-3 times per day and some shortness of breath.  She feels as though she has a lot of phlegm in her chest.  She does not have any associated upper airway symptoms at this point in time.  Her reflux is doing quite well.  She has not required a systemic steroid or antibiotic for any type of airway issue other than the steroid that she received at the time of COVID infection.  Allergies as of 12/22/2020       Reactions   Penicillins Hives, Other (See Comments)   Did it involve swelling of the face/tongue/throat, SOB, or low BP? No Did it involve sudden or severe rash/hives, skin peeling, or any reaction on the inside of your mouth or nose?    #  #  YES  #  #  Did you need to seek medical attention at a hospital or doctor's office?    #  #  YES  #  #  When did it last happen?     2019    Theophyllines Other (See Comments)   HEADACHE Severe headaches   Theophylline Other (See Comments)   REACTION: headaches        Medication List    albuterol (2.5 MG/3ML) 0.083% nebulizer solution Commonly known as: PROVENTIL Take 2.5 mg by nebulization every 6 (six) hours as needed for wheezing or shortness of breath.   albuterol 108 (90 Base) MCG/ACT  inhaler Commonly known as: ProAir HFA Inhale two puffs every 4-6 hours if needed for cough or wheeze.   aspirin EC 81 MG tablet Take 81 mg by mouth daily.   atorvastatin 10 MG tablet Commonly known as: LIPITOR TAKE 1 TABLET BY MOUTH AT 6 PM   Auvi-Q 0.3 mg/0.3 mL Soaj injection Generic drug: EPINEPHrine Use as directed for life-threatening allergic reaction.   Breztri Aerosphere 160-9-4.8 MCG/ACT Aero Generic drug: Budeson-Glycopyrrol-Formoterol INHALE TWO PUFFS TWICE DAILY TO PREVENT COUGH OR WHEEZE. RINSE, GARGLE, AND SPIT AFTER USE.   Flovent HFA 110 MCG/ACT inhaler Generic drug: fluticasone INHALE TWO PUFFS TWICE DAILY. RINSE MOUTH AFTER USE.   hydrochlorothiazide 12.5 MG capsule Commonly known as: MICROZIDE TAKE 1 CAPSULE BY MOUTH EVERY DAY   ibandronate 150 MG tablet Commonly known as: BONIVA Take 1 tablet by mouth every 28 (twenty-eight) days.   levalbuterol 45 MCG/ACT inhaler Commonly known as: XOPENEX HFA INHALE 2 PUFFS EVERY 4 HOURS AS NEEDED FOR COUGH OR WHEEZE   levothyroxine 88 MCG tablet Commonly known as: SYNTHROID Take 88 mcg by mouth daily before breakfast.   loratadine 10 MG tablet Commonly known as: CLARITIN Take 10 mg by mouth daily as needed for allergies. Reported on 07/21/2015   losartan 100 MG tablet Commonly known as: COZAAR Take 100 mg by  mouth daily.   mometasone 50 MCG/ACT nasal spray Commonly known as: Nasonex Use one spray in each nostril one to two times per days.   pantoprazole 40 MG tablet Commonly known as: PROTONIX Take 40 mg by mouth 2 (two) times daily.   Systane Complete 0.6 % Soln Generic drug: Propylene Glycol Place 1 drop into both eyes daily as needed (dry eyes).        Past Medical History:  Diagnosis Date   Abnormal stress test 04/18/2017   Acquired hypothyroidism 07/18/2015   Last Assessment & Plan:  Formatting of this note might be different from the original. Relevant Hx: Course: Daily Update: Today's  Plan:she is going to have her TSH updated for her   Electronically signed by: Mayer Camel, NP 07/22/15 2115   ADRENAL INSUFFICIENCY, HX OF 01/23/2010   Qualifier: Diagnosis of  By: Inda Castle FNP, Melissa S    Allergic rhinitis 12/12/2014   Aortic atherosclerosis ([place]) 02/18/2017   Formatting of this note might be different from the original. Noted on CT scan ([facility]) 02/2017   Arthritis    Asthma    ASTHMA 01/16/2010   Qualifier: Diagnosis of  By: Inda Castle FNP, Melissa S    Cardiomegaly 01/16/2010   Qualifier: Diagnosis of  By: Inda Castle FNP, Melissa S    Carpal tunnel syndrome on right    Coronary artery disease 07/21/2017   With mildly abnormal stress test showing small defect involving apex indicating distal LAD disease  Formatting of this note might be different from the original. Overview:  With mildly abnormal stress test showing small defect involving apex indicating distal LAD disease   COUGH, CHRONIC 01/23/2010   Qualifier: Diagnosis of  By: Inda Castle FNP, Melissa S    DEEP VENOUS THROMBOPHLEBITIS, LEG, RIGHT, HX OF 01/16/2010   Qualifier: Diagnosis of  By: Inda Castle FNP, Melissa S    Degeneration of lumbar intervertebral disc 07/18/2015   Last Assessment & Plan:  Formatting of this note might be different from the original. Relevant Hx: Course: Daily Update: Today's Plan:longstanding for her and she is still working and on her feet some days being harder for her than others, she is to again work on her weight to help with this  Electronically signed by: Mayer Camel, NP [id number]   DIASTOLIC DYSFUNCTION [id number]   Annotation: grade 2 per 2-D echo 01/26/10 Qualifier: Diagnosis of  By: Inda Castle FNP, Melissa S    Diverticulosis 10/03/2017   DYSPNEA ON EXERTION 01/16/2010   Qualifier: Diagnosis of  By: Inda Castle FNP, Melissa S    Dyspnea on exertion 03/17/2017   Edema 01/16/2010   Qualifier: Diagnosis of  By: Inda Castle FNP, Melissa S     Embolism - blood clot 2007   right leg hx of   Enlarged heart    Essential hypertension 01/29/2010   Qualifier: Diagnosis of  By: Ronnald Ramp RN, Crystal    Last Assessment & Plan:  Formatting of this note might be different from the original. Relevant Hx: Course: Daily Update: Today's Plan:stable for her and will follow this for her  Electronically signed by: Mayer Camel, NP 07/22/15 2113   Gastroesophageal reflux disease without esophagitis 07/18/2015   Last Assessment & Plan:  Formatting of this note might be different from the original. Relevant Hx: Course: Daily Update: Today's Plan:this is stable for her at this time and will follow her  Electronically signed by: Mayer Camel, NP 07/22/15 2116   GERD (gastroesophageal reflux disease)  GERD, SEVERE 02/01/2010   Qualifier: Diagnosis of  By: Joya Gaskins MD, Burnett Harry    Hypertension    Hypothyroidism    HYPOTHYROIDISM 01/16/2010   Qualifier: Diagnosis of  By: Inda Castle FNP, Melissa S    LIPOMA 05/15/2010   Qualifier: Diagnosis of  By: Inda Castle FNP, Melissa S    Lumbar spondylosis 02/18/2017   Formatting of this note might be different from the original. Noted on ([facility]) CT scan 02/2017   Mixed hyperlipidemia 07/18/2015   Last Assessment & Plan:  Formatting of this note might be different from the original. Relevant Hx: Course: Daily Update: Today's Plan:she is going to have her lipids updated fasting  Electronically signed by: Mayer Camel, NP 07/22/15 2112   OBESITY, MORBID 01/16/2010   Qualifier: Diagnosis of  By: Inda Castle FNP, Wellington [place]   Formatting of this note might be different from the original. Overview:  Qualifier: Diagnosis of  By: Inda Castle FNP, Melissa S   Obstructive sleep apnea syndrome 01/16/2010   last sleep study over five years Formatting of this note might be different from the original. Overview:  Qualifier: Diagnosis of  By: Inda Castle FNP, [place]:   Formatting of this note might be different from the original. Relevant Hx: Course: Daily Update: Today's Plan:she cannot wear the mask for this and have discussed with her how this can affect her by not doing so     Osteopenia of multiple sites 06/02/2017   Formatting of this note might be different from the original. -2.3   Other acute sinusitis 01/29/2010   Qualifier: Diagnosis of  By: Joya Gaskins MD, Burnett Harry    Prediabetes 07/22/2015   Last Assessment & Plan:  Formatting of this note might be different from the original. Relevant Hx: Course: Daily Update: Today's Plan:would update her HGBA1C for her and with her use of the steroids this can be elevated for her  Electronically signed by: Mayer Camel, NP 07/22/15 2109   Right rotator cuff tear 08/09/2014   Severe persistent asthma 01/09/2015   Adena presents today with a one week history of wheezing and DOE and slight cough requiring her to use a SABA a few times per day. In addition, has nasal congestion and green rhinorrhea. She has been off her prednisone for over two months and was doing very well until this flare up. She continue on her Nucala and her large collection of medication for inflammation and reflux. Her reflux is under c   Shortness of breath dyspnea    with exertion   Sleep apnea    last sleep study over five years   SLEEP APNEA, OBSTRUCTIVE 01/16/2010   Qualifier: Diagnosis of  By: Inda Castle FNP, Melissa S    Thyroid disease    hypothyroidism   Vitamin D deficiency 02/02/2010   Qualifier: Diagnosis of  By: Inda Castle FNP, Melissa S   Formatting of this note might be different from the original. Overview:  Qualifier: Diagnosis of  By: Inda Castle FNP, Wellington Hampshire    Past Surgical History:  Procedure Laterality Date   CARPAL TUNNEL RELEASE Right    EYE SURGERY Bilateral    cataract removal    KNEE SURGERY  2007   right knee   LIPOMA EXCISION Right 03/28/2019   Procedure: EXCISION SUBCUTANEOUS LIPOMA RIGHT SHOULDER;   Surgeon: Donnie Mesa, MD;  Location: [place];  Service: General;  Laterality: Right;   SHOULDER ARTHROSCOPY WITH OPEN ROTATOR CUFF REPAIR AND DISTAL CLAVICLE ACROMINECTOMY  Right 08/09/2014   Procedure: SHOULDER ARTHROSCOPY WITH OPEN ROTATOR CUFF REPAIR AND DISTAL CLAVICLE ACROMINECTOMY;  Surgeon: Earlie Server, MD;  Location: [place];  Service: Orthopedics;  Laterality: Right;   SINUS SURGERY WITH INSTATRAK     ???    Review of systems negative except as noted in HPI / PMHx or noted below:  Review of Systems  Constitutional: Negative.   HENT: Negative.    Eyes: Negative.   Respiratory: Negative.    Cardiovascular: Negative.   Gastrointestinal: Negative.   Genitourinary: Negative.   Musculoskeletal: Negative.   Skin: Negative.   Neurological: Negative.   Endo/Heme/Allergies: Negative.   Psychiatric/Behavioral: Negative.      Objective:   Vitals:   12/22/20 1341  BP: 126/82  Pulse: 76  Resp: 16  SpO2: 94%   Height: '5\' 1"'$  (154.9 cm)  Weight: 284 lb 3.2 oz (128.9 kg)   Physical Exam Constitutional:      Appearance: She is not diaphoretic.  HENT:     Head: Normocephalic.     Right Ear: Tympanic membrane, ear canal and external ear normal.     Left Ear: Tympanic membrane, ear canal and external ear normal.     Nose: Nose normal. No mucosal edema or rhinorrhea.     Mouth/Throat:     Pharynx: Uvula midline. No oropharyngeal exudate.  Eyes:     Conjunctiva/sclera: Conjunctivae normal.  Neck:     Thyroid: No thyromegaly.     Trachea: Trachea normal. No tracheal tenderness or tracheal deviation.  Cardiovascular:     Rate and Rhythm: Normal rate and regular rhythm.     Heart sounds: Normal heart sounds, S1 normal and S2 normal. No murmur heard. Pulmonary:     Effort: No respiratory distress.     Breath sounds: No stridor. Wheezing (Bilateral expiratory wheezing all lung fields) present. No rales.  Lymphadenopathy:     Head:     Right side of head: No tonsillar  adenopathy.     Left side of head: No tonsillar adenopathy.     Cervical: No cervical adenopathy.  Skin:    Findings: No erythema or rash.     Nails: There is no clubbing.  Neurological:     Mental Status: She is alert.    Diagnostics:    Spirometry was performed and demonstrated an FEV1 of 1.50 at 67 % of predicted.  Assessment and Plan:   1. 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 Other allergic rhinitis   4. LPRD (laryngopharyngeal reflux disease)     1. Continue Breztri - two puffs two times per day    2. Add Flovent 110 2 inhalations two times per day during increased asthma activity  3. Continue pantoprazole '40mg'$  twice a day  4. Continue Benralizumab injections  5. Continue nasonex one spray each nostril 1-2 times per day.   6. Continue Proair HFA or Albuterol nebulization if needed.  7. Can add OTC antihistamine - Zyrtec / Allegra / Claritin, and nasal saline  8.  For this recent episode use the following:   A.  Azithromycin 250 mg -1 tablet 1 time per day for 10 days  B.  Prednisone 10 mg - 2 tabs daily x 10 days, then 1 tab daily x 10 days  9. Consider changing Benralizumab to Tezepelumab if not doing well.  10. Return to clinic in 6 months or earlier if problem  11.  Obtain fall flu vaccine  I will  have Khaila utilize a slow taper of a systemic steroid and some broad-spectrum antibiotic to address the inflammation and possible low-grade bacterial growth within her airway with the therapy noted above while she continues on a large collection of treatment directed at airway inflammation and her reflux.  She will continue on benralizumab injections at this point but if she does not do well as we move forward we may need to consider starting her on anti-TSLP antibody.  She will keep in contact with me noting her response to this approach.  Allena Katz, MD Allergy / Immunology [place]

## 2020-12-23 ENCOUNTER — Encounter: Payer: Self-pay | Admitting: Allergy and Immunology

## 2021-01-06 ENCOUNTER — Ambulatory Visit: Payer: Managed Care, Other (non HMO) | Admitting: Cardiology

## 2021-01-06 ENCOUNTER — Other Ambulatory Visit: Payer: Self-pay

## 2021-01-06 ENCOUNTER — Encounter: Payer: Self-pay | Admitting: Cardiology

## 2021-01-06 VITALS — BP 114/72 | HR 70 | Ht 61.0 in | Wt 290.6 lb

## 2021-01-06 DIAGNOSIS — I2510 Atherosclerotic heart disease of native coronary artery without angina pectoris: Secondary | ICD-10-CM

## 2021-01-06 DIAGNOSIS — J4550 Severe persistent asthma, uncomplicated: Secondary | ICD-10-CM | POA: Diagnosis not present

## 2021-01-06 DIAGNOSIS — I10 Essential (primary) hypertension: Secondary | ICD-10-CM

## 2021-01-06 DIAGNOSIS — R0600 Dyspnea, unspecified: Secondary | ICD-10-CM

## 2021-01-06 DIAGNOSIS — E782 Mixed hyperlipidemia: Secondary | ICD-10-CM

## 2021-01-06 DIAGNOSIS — R0609 Other forms of dyspnea: Secondary | ICD-10-CM

## 2021-01-06 DIAGNOSIS — I5032 Chronic diastolic (congestive) heart failure: Secondary | ICD-10-CM | POA: Diagnosis not present

## 2021-01-06 NOTE — Patient Instructions (Signed)
Medication Instructions:  Your physician recommends that you continue on your current medications as directed. Please refer to the Current Medication list given to you today.  *If you need a refill on your cardiac medications before your next appointment, please call your pharmacy*   Lab Work: None If you have labs (blood work) drawn today and your tests are completely normal, you will receive your results only by: [facility] (if you have MyChart) OR A paper copy in the mail If you have any lab test that is abnormal or we need to change your treatment, we will call you to review the results.   Testing/Procedures: Your physician has requested that you have an echocardiogram. Echocardiography is a painless test that uses sound waves to create images of your heart. It provides your doctor with information about the size and shape of your heart and how well your heart's chambers and valves are working. This procedure takes approximately one hour. There are no restrictions for this procedure.    Follow-Up: At [facility], you and your health needs are our priority.  As part of our continuing mission to provide you with exceptional heart care, we have created designated Provider Care Teams.  These Care Teams include your primary Cardiologist (physician) and Advanced Practice Providers (APPs -  Physician Assistants and Nurse Practitioners) who all work together to provide you with the care you need, when you need it.  We recommend signing up for the patient portal called "MyChart".  Sign up information is provided on this After Visit Summary.  MyChart is used to connect with patients for Virtual Visits (Telemedicine).  Patients are able to view lab/test results, encounter notes, upcoming appointments, etc.  Non-urgent messages can be sent to your provider as well.   To learn more about what you can do with MyChart, go to [email or web address].    Your next appointment:   6  month(s)  The format for your next appointment:   In Person  Provider:   Jenne Campus, MD   Other Instructions  Echocardiogram An echocardiogram is a test that uses sound waves (ultrasound) to produce images of the heart. Images from an echocardiogram can provide important information about: Heart size and shape. The size and thickness and movement of your heart's walls. Heart muscle function and strength. Heart valve function or if you have stenosis. Stenosis is when the heart valves are too narrow. If blood is flowing backward through the heart valves (regurgitation). A tumor or infectious growth around the heart valves. Areas of heart muscle that are not working well because of poor blood flow or injury from a heart attack. Aneurysm detection. An aneurysm is a weak or damaged part of an artery wall. The wall bulges out from the normal force of blood pumping through the body. Tell a health care provider about: Any allergies you have. All medicines you are taking, including vitamins, herbs, eye drops, creams, and over-the-counter medicines. Any blood disorders you have. Any surgeries you have had. Any medical conditions you have. Whether you are pregnant or may be pregnant. What are the risks? Generally, this is a safe test. However, problems may occur, including an allergic reaction to dye (contrast) that may be used during the test. What happens before the test? No specific preparation is needed. You may eat and drink normally. What happens during the test?  You will take off your clothes from the waist up and put on a hospital gown. Electrodes or electrocardiogram (ECG)patches may be placed  on your chest. The electrodes or patches are then connected to a device that monitors your heart rate and rhythm. You will lie down on a table for an ultrasound exam. A gel will be applied to your chest to help sound waves pass through your skin. A handheld device, called a  transducer, will be pressed against your chest and moved over your heart. The transducer produces sound waves that travel to your heart and bounce back (or "echo" back) to the transducer. These sound waves will be captured in real-time and changed into images of your heart that can be viewed on a video monitor. The images will be recorded on a computer and reviewed by your health care provider. You may be asked to change positions or hold your breath for a short time. This makes it easier to get different views or better views of your heart. In some cases, you may receive contrast through an IV in one of your veins. This can improve the quality of the pictures from your heart. The procedure may vary among health care providers and hospitals. What can I expect after the test? You may return to your normal, everyday life, including diet, activities, and medicines, unless your health care provider tells you not to do that. Follow these instructions at home: It is up to you to get the results of your test. Ask your health care provider, or the department that is doing the test, when your results will be ready. Keep all follow-up visits. This is important. Summary An echocardiogram is a test that uses sound waves (ultrasound) to produce images of the heart. Images from an echocardiogram can provide important information about the size and shape of your heart, heart muscle function, heart valve function, and other possible heart problems. You do not need to do anything to prepare before this test. You may eat and drink normally. After the echocardiogram is completed, you may return to your normal, everyday life, unless your health care provider tells you not to do that. This information is not intended to replace advice given to you by your health care provider. Make sure you discuss any questions you have with your health care provider. Document Revised: 11/20/2019 Document Reviewed: 11/20/2019 Elsevier  Patient Education  2022 [employer].

## 2021-01-06 NOTE — Addendum Note (Signed)
Addended by: Senaida Ores on: 01/06/2021 01:48 PM   Modules accepted: Orders

## 2021-01-06 NOTE — Progress Notes (Signed)
Cardiology Office Note:    Date:  01/06/2021   ID:  Beth Rodriguez, DOB 28-Apr-1960, MRN [id number]  PCP:  [name], MD  Cardiologist:  Jenne Campus, MD    Referring MD: [name], MD   Chief Complaint  Patient presents with   Follow-up  Doing well  History of Present Illness:    Beth Rodriguez is a 60 y.o. female with past medical history significant for coronary artery disease.  In 2018 she had stress test done for atypical chest pain showed questionable defect involving apex which indicates distal LAD disease.  Since that time she has been doing well she is morbidly obese she does have fatigue tiredness and exertional shortness of breath, also diastolic dysfunction dyslipidemia as well as bronchial asthma.  She comes today to my office for follow-up she denies have any chest pain tightness squeezing pressure burning chest.  Last time I saw her she working someplace which was very dusty give her a lot of shortness of breath.  Also few months ago she ended up having COVID-19 infection still recovering from it being excellently followed by Dr. Theora Gianotti allergist for her asthma.  Past Medical History:  Diagnosis Date   Abnormal stress test 04/18/2017   Acquired hypothyroidism 07/18/2015   Last Assessment & Plan:  Formatting of this note might be different from the original. Relevant Hx: Course: Daily Update: Today's Plan:she is going to have her TSH updated for her   Electronically signed by: Mayer Camel, NP 07/22/15 2115   ADRENAL INSUFFICIENCY, HX OF 01/23/2010   Qualifier: Diagnosis of  By: Inda Castle FNP, Melissa S    Allergic rhinitis 12/12/2014   Aortic atherosclerosis ([place]) 02/18/2017   Formatting of this note might be different from the original. Noted on CT scan ([facility]) 02/2017   Arthritis    Asthma    ASTHMA 01/16/2010   Qualifier: Diagnosis of  By: Inda Castle FNP, Melissa S    Cardiomegaly 01/16/2010   Qualifier: Diagnosis of  By: Inda Castle  FNP, Melissa S    Carpal tunnel syndrome on right    Coronary artery disease 07/21/2017   With mildly abnormal stress test showing small defect involving apex indicating distal LAD disease  Formatting of this note might be different from the original. Overview:  With mildly abnormal stress test showing small defect involving apex indicating distal LAD disease   COUGH, CHRONIC 01/23/2010   Qualifier: Diagnosis of  By: Inda Castle FNP, Melissa S    DEEP VENOUS THROMBOPHLEBITIS, LEG, RIGHT, HX OF 01/16/2010   Qualifier: Diagnosis of  By: Inda Castle FNP, Melissa S    Degeneration of lumbar intervertebral disc 07/18/2015   Last Assessment & Plan:  Formatting of this note might be different from the original. Relevant Hx: Course: Daily Update: Today's Plan:longstanding for her and she is still working and on her feet some days being harder for her than others, she is to again work on her weight to help with this  Electronically signed by: Mayer Camel, NP 37/16/96 7893   DIASTOLIC DYSFUNCTION 81/04/7508   Annotation: grade 2 per 2-D echo 01/26/10 Qualifier: Diagnosis of  By: Inda Castle FNP, Melissa S    Diverticulosis 10/03/2017   DYSPNEA ON EXERTION 01/16/2010   Qualifier: Diagnosis of  By: Inda Castle FNP, Melissa S    Dyspnea on exertion 03/17/2017   Edema 01/16/2010   Qualifier: Diagnosis of  By: Inda Castle FNP, Melissa S    Embolism - blood clot 2007   right  leg hx of   Enlarged heart    Essential hypertension 01/29/2010   Qualifier: Diagnosis of  By: Ronnald Ramp RN, [place]    Last Assessment & Plan:  Formatting of this note might be different from the original. Relevant Hx: Course: Daily Update: Today's Plan:stable for her and will follow this for her  Electronically signed by: Mayer Camel, NP 07/22/15 2113   Gastroesophageal reflux disease without esophagitis 07/18/2015   Last Assessment & Plan:  Formatting of this note might be different from the original. Relevant Hx: Course:  Daily Update: Today's Plan:this is stable for her at this time and will follow her  Electronically signed by: Mayer Camel, NP 07/22/15 2116   GERD (gastroesophageal reflux disease)    GERD, SEVERE 02/01/2010   Qualifier: Diagnosis of  By: Joya Gaskins MD, Burnett Harry    Hypertension    Hypothyroidism    HYPOTHYROIDISM 01/16/2010   Qualifier: Diagnosis of  By: Inda Castle FNP, Melissa S    LIPOMA 05/15/2010   Qualifier: Diagnosis of  By: Inda Castle FNP, Melissa S    Lumbar spondylosis 02/18/2017   Formatting of this note might be different from the original. Noted on ([facility]) CT scan 02/2017   Mixed hyperlipidemia 07/18/2015   Last Assessment & Plan:  Formatting of this note might be different from the original. Relevant Hx: Course: Daily Update: Today's Plan:she is going to have her lipids updated fasting  Electronically signed by: Mayer Camel, NP 07/22/15 2112   OBESITY, MORBID 01/16/2010   Qualifier: Diagnosis of  By: Inda Castle FNP, [place]   Formatting of this note might be different from the original. Overview:  Qualifier: Diagnosis of  By: Inda Castle FNP, Melissa S   Obstructive sleep apnea syndrome 01/16/2010   last sleep study over five years Formatting of this note might be different from the original. Overview:  Qualifier: Diagnosis of  By: Inda Castle FNP, [place]:  Formatting of this note might be different from the original. Relevant Hx: Course: Daily Update: Today's Plan:she cannot wear the mask for this and have discussed with her how this can affect her by not doing so     Osteopenia of multiple sites 06/02/2017   Formatting of this note might be different from the original. -2.3   Other acute sinusitis 01/29/2010   Qualifier: Diagnosis of  By: Joya Gaskins MD, Burnett Harry    Prediabetes 07/22/2015   Last Assessment & Plan:  Formatting of this note might be different from the original. Relevant Hx: Course: Daily Update: Today's Plan:would  update her HGBA1C for her and with her use of the steroids this can be elevated for her  Electronically signed by: Mayer Camel, NP 07/22/15 2109   Right rotator cuff tear 08/09/2014   Severe persistent asthma 01/09/2015   Beth Rodriguez presents today with a one week history of wheezing and DOE and slight cough requiring her to use a SABA a few times per day. In addition, has nasal congestion and green rhinorrhea. She has been off her prednisone for over two months and was doing very well until this flare up. She continue on her Nucala and her large collection of medication for inflammation and reflux. Her reflux is under c   Shortness of breath dyspnea    with exertion   Sleep apnea    last sleep study over five years   SLEEP APNEA, OBSTRUCTIVE 01/16/2010   Qualifier: Diagnosis of  By: Inda Castle FNP, [place]  Thyroid disease    hypothyroidism   Vitamin D deficiency 02/02/2010   Qualifier: Diagnosis of  By: Inda Castle FNP, Melissa S   Formatting of this note might be different from the original. Overview:  Qualifier: Diagnosis of  By: Inda Castle FNP, [place]    Past Surgical History:  Procedure Laterality Date   CARPAL TUNNEL RELEASE Right    EYE SURGERY Bilateral    cataract removal    KNEE SURGERY  2007   right knee   LIPOMA EXCISION Right 03/28/2019   Procedure: EXCISION SUBCUTANEOUS LIPOMA RIGHT SHOULDER;  Surgeon: Donnie Mesa, MD;  Location: [facility];  Service: General;  Laterality: Right;   SHOULDER ARTHROSCOPY WITH OPEN ROTATOR CUFF REPAIR AND DISTAL CLAVICLE ACROMINECTOMY Right 08/09/2014   Procedure: SHOULDER ARTHROSCOPY WITH OPEN ROTATOR CUFF REPAIR AND DISTAL CLAVICLE [place];  Surgeon: Earlie Server, MD;  Location: [place];  Service: Orthopedics;  Laterality: Right;   SINUS SURGERY WITH INSTATRAK     ???    Current Medications: Current Meds  Medication Sig   albuterol (PROAIR HFA) 108 (90 Base) MCG/ACT inhaler Inhale two puffs every 4-6 hours if needed for  cough or wheeze. (Patient taking differently: 2 puffs every 6 (six) hours as needed for wheezing or shortness of breath. Inhale two puffs every 4-6 hours if needed for cough or wheeze.)   albuterol (PROVENTIL) (2.5 MG/3ML) 0.083% nebulizer solution Take 2.5 mg by nebulization every 6 (six) hours as needed for wheezing or shortness of breath.   aspirin EC 81 MG tablet Take 81 mg by mouth daily.    atorvastatin (LIPITOR) 10 MG tablet TAKE 1 TABLET BY MOUTH AT 6 PM (Patient taking differently: Take 10 mg by mouth daily.)   AUVI-Q 0.3 MG/0.3ML SOAJ injection Use as directed for life-threatening allergic reaction. (Patient taking differently: Inject 0.3 mg into the muscle as needed for anaphylaxis. Use as directed for life-threatening allergic reaction.)   BREZTRI AEROSPHERE 160-9-4.8 MCG/ACT AERO INHALE TWO PUFFS TWICE DAILY TO PREVENT COUGH OR WHEEZE. RINSE, GARGLE, AND SPIT AFTER USE. (Patient taking differently: Inhale 2 puffs into the lungs See admin instructions. Inhale two puffs twice daily to prevent cough or wheeze.  Rinse, gargle, and spit after use.)   FLOVENT HFA 110 MCG/ACT inhaler INHALE TWO PUFFS TWICE DAILY. RINSE MOUTH AFTER USE. (Patient taking differently: Inhale 2 puffs into the lungs 2 (two) times daily. INHALE TWO PUFFS TWICE DAILY. RINSE MOUTH AFTER USE.)   hydrochlorothiazide (MICROZIDE) 12.5 MG capsule TAKE 1 CAPSULE BY MOUTH EVERY DAY (Patient taking differently: Take 12.5 mg by mouth daily.)   ibandronate (BONIVA) 150 MG tablet Take 1 tablet by mouth every 28 (twenty-eight) days.   levalbuterol (XOPENEX HFA) 45 MCG/ACT inhaler INHALE 2 PUFFS EVERY 4 HOURS AS NEEDED FOR COUGH OR WHEEZE (Patient taking differently: Inhale 2 puffs into the lungs every 4 (four) hours as needed for wheezing or shortness of breath. INHALE 2 PUFFS EVERY 4 HOURS AS NEEDED FOR COUGH OR WHEEZE)   levothyroxine (SYNTHROID, LEVOTHROID) 88 MCG tablet Take 88 mcg by mouth daily before breakfast.    loratadine  (CLARITIN) 10 MG tablet Take 10 mg by mouth daily as needed for allergies. Reported on 07/21/2015   losartan (COZAAR) 100 MG tablet Take 100 mg by mouth daily.   mometasone (NASONEX) 50 MCG/ACT nasal spray Use one spray in each nostril one to two times per days. (Patient taking differently: Place 2 sprays into the nose daily. Use one spray in each nostril one to two  times per days.)   pantoprazole (PROTONIX) 40 MG tablet Take 40 mg by mouth 2 (two) times daily.   Propylene Glycol (SYSTANE COMPLETE) 0.6 % SOLN Place 1 drop into both eyes daily as needed (dry eyes).   Current Facility-Administered Medications for the 01/06/21 encounter (Office Visit) with Park Liter, MD  Medication   Benralizumab SOSY 30 mg     Allergies:   Penicillins, Theophyllines, and Theophylline   Social History   Socioeconomic History   Marital status: Married    Spouse name: Not on file   Number of children: 1   Years of education: Not on file   Highest education level: Not on file  Occupational History   Occupation: PRODUCTION ASSEMBLY    Employer: [employer]  Tobacco Use   Smoking status: Never   Smokeless tobacco: Never  Vaping Use   Vaping Use: Never used  Substance and Sexual Activity   Alcohol use: No   Drug use: No   Sexual activity: Not on file  Other Topics Concern   Not on file  Social History Narrative   Not on file   Social Determinants of Health   Financial Resource Strain: Not on file  Food Insecurity: Not on file  Transportation Needs: Not on file  Physical Activity: Not on file  Stress: Not on file  Social Connections: Not on file     Family History: The patient's family history includes Cancer in her sister; Carpal tunnel syndrome in her daughter and sister; Diabetes in her sister; Hyperlipidemia in her sister; Hypertension in her brother, daughter, mother, and sister. ROS:   Please see the history of present illness.    All 14 point review of systems negative  except as described per history of present illness  EKGs/Labs/Other Studies Reviewed:      Recent Labs: No results found for requested labs within last 8760 hours.  Recent Lipid Panel    Component Value Date/Time   CHOL 137 07/21/2017 1015   TRIG 44 07/21/2017 1015   HDL 62 07/21/2017 1015   CHOLHDL 2.2 07/21/2017 1015   LDLCALC 66 07/21/2017 1015    Physical Exam:    VS:  BP 114/72 (BP Location: Left Arm, Patient Position: Sitting)   Pulse 70   Ht 5\' 1"  (1.549 m)   Wt 290 lb 9.6 oz (131.8 kg)   SpO2 95%   BMI 54.91 kg/m     Wt Readings from Last 3 Encounters:  01/06/21 290 lb 9.6 oz (131.8 kg)  12/22/20 284 lb 3.2 oz (128.9 kg)  03/27/20 295 lb (133.8 kg)     GEN:  Well nourished, well developed in no acute distress HEENT: Normal NECK: No JVD; No carotid bruits LYMPHATICS: No lymphadenopathy CARDIAC: RRR, no murmurs, no rubs, no gallops RESPIRATORY:  Clear to auscultation without rales, wheezing or rhonchi  ABDOMEN: Soft, non-tender, non-distended MUSCULOSKELETAL:  No edema; No deformity  SKIN: Warm and dry LOWER EXTREMITIES: no swelling NEUROLOGIC:  Alert and oriented x 3 PSYCHIATRIC:  Normal affect   ASSESSMENT:    1. Coronary artery disease involving native coronary artery of native heart without angina pectoris   2. Primary hypertension   3. Severe persistent asthma without complication   4. Chronic diastolic congestive heart failure ([place])   5. Mixed hyperlipidemia    PLAN:    In order of problems listed above:  Coronary disease with abnormal stress test showing distal LAD disease however asymptomatic also morbidly obese which makes future assessment  somewhat difficult as long as she is asymptomatic I think the key is risk factors modifications she is already on antiplatelet therapy in form of aspirin which I will continue.  She is also on Lipitor.  Not on any antianginal therapy since she does have any symptoms Essential hypertension blood pressure  seems to be well controlled continue present management. Severe persistent asthma follow-up by allergist Dyslipidemia: Lipid profile done in April show LDL 74 and her HDL 59 and she is taking moderate intensity statin form of Lipitor 10 mg which I will continue.   Medication Adjustments/Labs and Tests Ordered: Current medicines are reviewed at length with the patient today.  Concerns regarding medicines are outlined above.  No orders of the defined types were placed in this encounter.  Medication changes: No orders of the defined types were placed in this encounter.   Signed, Park Liter, MD, [facility] 01/06/2021 1:40 PM    [place]

## 2021-01-15 ENCOUNTER — Other Ambulatory Visit: Payer: Self-pay

## 2021-01-15 ENCOUNTER — Ambulatory Visit (INDEPENDENT_AMBULATORY_CARE_PROVIDER_SITE_OTHER): Payer: Managed Care, Other (non HMO) | Admitting: *Deleted

## 2021-01-15 DIAGNOSIS — J4550 Severe persistent asthma, uncomplicated: Secondary | ICD-10-CM

## 2021-01-19 ENCOUNTER — Ambulatory Visit (INDEPENDENT_AMBULATORY_CARE_PROVIDER_SITE_OTHER): Payer: Managed Care, Other (non HMO)

## 2021-01-19 ENCOUNTER — Other Ambulatory Visit: Payer: Self-pay

## 2021-01-19 DIAGNOSIS — R0609 Other forms of dyspnea: Secondary | ICD-10-CM

## 2021-01-19 LAB — ECHOCARDIOGRAM COMPLETE
Area-P 1/2: 3.49 cm2
S' Lateral: 3.1 cm

## 2021-01-23 ENCOUNTER — Telehealth: Payer: Self-pay | Admitting: Cardiology

## 2021-01-23 NOTE — Telephone Encounter (Signed)
Results reviewed with pt as per Dr. Krasowski's note.  Pt verbalized understanding and had no additional questions. Routed to PCP  

## 2021-01-23 NOTE — Telephone Encounter (Signed)
Patient is returning Beth Rodriguez call from yesterday in regards to her Echo results.

## 2021-02-10 ENCOUNTER — Other Ambulatory Visit: Payer: Self-pay | Admitting: Allergy and Immunology

## 2021-03-12 ENCOUNTER — Other Ambulatory Visit: Payer: Self-pay

## 2021-03-12 ENCOUNTER — Ambulatory Visit (INDEPENDENT_AMBULATORY_CARE_PROVIDER_SITE_OTHER): Payer: Managed Care, Other (non HMO) | Admitting: *Deleted

## 2021-03-12 DIAGNOSIS — J4550 Severe persistent asthma, uncomplicated: Secondary | ICD-10-CM | POA: Diagnosis not present

## 2021-04-14 ENCOUNTER — Other Ambulatory Visit: Payer: Self-pay | Admitting: *Deleted

## 2021-04-14 MED ORDER — ATORVASTATIN CALCIUM 10 MG PO TABS: 10.0000 mg | ORAL_TABLET | Freq: Every day | ORAL | 2 refills | Status: DC

## 2021-04-14 MED ORDER — ALBUTEROL SULFATE HFA 108 (90 BASE) MCG/ACT IN AERS: INHALATION_SPRAY | RESPIRATORY_TRACT | 1 refills | Status: DC

## 2021-04-14 NOTE — Telephone Encounter (Signed)
Rx refill sent to pharmacy. 

## 2021-04-29 ENCOUNTER — Telehealth: Payer: Self-pay

## 2021-04-29 NOTE — Telephone Encounter (Signed)
Patient called and wanted to let you know that she had a change in her insurance.  It is now [employer],  [place]: [id number], Group # [id number].  Please call her if you have any more questions [phone])

## 2021-05-01 NOTE — Telephone Encounter (Signed)
Approval started under new Ins

## 2021-05-07 ENCOUNTER — Ambulatory Visit: Payer: Managed Care, Other (non HMO)

## 2021-05-07 ENCOUNTER — Other Ambulatory Visit: Payer: Self-pay | Admitting: Allergy and Immunology

## 2021-05-12 ENCOUNTER — Ambulatory Visit (INDEPENDENT_AMBULATORY_CARE_PROVIDER_SITE_OTHER): Payer: 59

## 2021-05-12 ENCOUNTER — Other Ambulatory Visit: Payer: Self-pay

## 2021-05-12 DIAGNOSIS — J4550 Severe persistent asthma, uncomplicated: Secondary | ICD-10-CM | POA: Diagnosis not present

## 2021-06-25 ENCOUNTER — Other Ambulatory Visit: Payer: Self-pay | Admitting: *Deleted

## 2021-06-25 MED ORDER — FASENRA PEN 30 MG/ML ~~LOC~~ SOAJ: 30.0000 mg | SUBCUTANEOUS | 6 refills | Status: DC

## 2021-07-07 ENCOUNTER — Ambulatory Visit: Payer: 59

## 2021-07-09 ENCOUNTER — Ambulatory Visit: Payer: Managed Care, Other (non HMO) | Admitting: Cardiology

## 2021-07-16 ENCOUNTER — Ambulatory Visit: Payer: 59

## 2021-07-22 ENCOUNTER — Ambulatory Visit (INDEPENDENT_AMBULATORY_CARE_PROVIDER_SITE_OTHER): Payer: 59 | Admitting: *Deleted

## 2021-07-22 DIAGNOSIS — J4550 Severe persistent asthma, uncomplicated: Secondary | ICD-10-CM | POA: Diagnosis not present

## 2021-09-02 ENCOUNTER — Other Ambulatory Visit: Payer: Self-pay | Admitting: *Deleted

## 2021-09-04 ENCOUNTER — Other Ambulatory Visit: Payer: Self-pay | Admitting: Allergy and Immunology

## 2021-09-08 ENCOUNTER — Other Ambulatory Visit: Payer: Self-pay

## 2021-09-08 MED ORDER — BREZTRI AEROSPHERE 160-9-4.8 MCG/ACT IN AERO: 2.0000 | INHALATION_SPRAY | Freq: Two times a day (BID) | RESPIRATORY_TRACT | 5 refills | Status: DC

## 2021-09-08 MED ORDER — ALBUTEROL SULFATE HFA 108 (90 BASE) MCG/ACT IN AERS: INHALATION_SPRAY | RESPIRATORY_TRACT | 1 refills | Status: DC

## 2021-09-16 ENCOUNTER — Other Ambulatory Visit: Payer: Self-pay | Admitting: Neurosurgery

## 2021-09-16 ENCOUNTER — Ambulatory Visit (INDEPENDENT_AMBULATORY_CARE_PROVIDER_SITE_OTHER): Payer: 59 | Admitting: *Deleted

## 2021-09-16 DIAGNOSIS — M5440 Lumbago with sciatica, unspecified side: Secondary | ICD-10-CM

## 2021-09-16 DIAGNOSIS — J4550 Severe persistent asthma, uncomplicated: Secondary | ICD-10-CM

## 2021-09-28 ENCOUNTER — Encounter: Payer: Self-pay | Admitting: Allergy and Immunology

## 2021-09-28 ENCOUNTER — Ambulatory Visit: Payer: 59 | Admitting: Allergy and Immunology

## 2021-09-28 VITALS — BP 126/78 | HR 83 | Resp 16

## 2021-09-28 DIAGNOSIS — J3089 Other allergic rhinitis: Secondary | ICD-10-CM | POA: Diagnosis not present

## 2021-09-28 DIAGNOSIS — K219 Gastro-esophageal reflux disease without esophagitis: Secondary | ICD-10-CM

## 2021-09-28 DIAGNOSIS — J4550 Severe persistent asthma, uncomplicated: Secondary | ICD-10-CM

## 2021-09-28 NOTE — Progress Notes (Unsigned)
[facility] [place] [facility] [place]   Follow-up Note  Referring Provider: [name], MD Primary Provider: [name], MD Date of Office Visit: 09/28/2021  Subjective:   Beth Rodriguez (DOB: 06-29-1960) is a 61 y.o. female who returns to the [facility] on 09/28/2021 in re-evaluation of the following:  HPI: Beth Rodriguez returns to this clinic in evaluation of severe asthma, allergic rhinitis, history of chronic sinusitis, and history of LPR.  Her last visit to this clinic was 22 December 2020.  She has apparently had 1 exacerbation of her asthma requiring a systemic steroid sometime in the early part of the spring but otherwise has not required a systemic steroid or antibiotic for her airway issue and feels as though she is doing relatively well and stable with the requirement for short acting bronchodilator that appears to occur prior to her going to work.  Her use of this albuterol is more of a habit than it is a requirement but she still does this every day prior to work which averages out about 3 times per week.  She continues on her triple inhaler and she continues on benralizumab injections.  She is done very well with her nose while using some nasal steroid.  She is doing very well with her reflux while using pantoprazole mostly 1 time per day.  Allergies as of 09/28/2021       Reactions   Penicillins Hives, Other (See Comments)   Did it involve swelling of the face/tongue/throat, SOB, or low BP? No Did it involve sudden or severe rash/hives, skin peeling, or any reaction on the inside of your mouth or nose?    #  #  YES  #  #  Did you need to seek medical attention at a hospital or doctor's office?    #  #  YES  #  #  When did it last happen?     2019    Theophyllines Other (See Comments)   HEADACHE Severe headaches   Theophylline Other (See Comments)   REACTION: headaches        Medication List    albuterol (2.5 MG/3ML)  0.083% nebulizer solution Commonly known as: PROVENTIL Take 2.5 mg by nebulization every 6 (six) hours as needed for wheezing or shortness of breath.   albuterol 108 (90 Base) MCG/ACT inhaler Commonly known as: VENTOLIN HFA INHALE TWO PUFFS EVERY 4-6 HOURS IF NEEDED FOR COUGH OR WHEEZE.   aspirin EC 81 MG tablet Take 81 mg by mouth daily.   atorvastatin 10 MG tablet Commonly known as: LIPITOR Take 1 tablet (10 mg total) by mouth daily.   Auvi-Q 0.3 mg/0.3 mL Soaj injection Generic drug: EPINEPHrine Use as directed for life-threatening allergic reaction.   Breztri Aerosphere 160-9-4.8 MCG/ACT Aero Generic drug: Budeson-Glycopyrrol-Formoterol Inhale 2 puffs into the lungs 2 (two) times daily.   Fasenra Pen 30 MG/ML Soaj Generic drug: Benralizumab Inject 1 mL (30 mg total) into the skin every 8 (eight) weeks.   hydrochlorothiazide 12.5 MG capsule Commonly known as: MICROZIDE TAKE 1 CAPSULE BY MOUTH EVERY DAY   ibandronate 150 MG tablet Commonly known as: BONIVA Take 1 tablet by mouth every 28 (twenty-eight) days.   levalbuterol 45 MCG/ACT inhaler Commonly known as: XOPENEX HFA INHALE 2 PUFFS EVERY 4 HOURS AS NEEDED FOR COUGH OR WHEEZE   levothyroxine 88 MCG tablet Commonly known as: SYNTHROID Take 88 mcg by mouth daily before breakfast.   loratadine 10  MG tablet Commonly known as: CLARITIN Take 10 mg by mouth daily as needed for allergies. Reported on 07/21/2015   losartan 100 MG tablet Commonly known as: COZAAR Take 100 mg by mouth daily.   mometasone 50 MCG/ACT nasal spray Commonly known as: Nasonex Use one spray in each nostril one to two times per days.   pantoprazole 40 MG tablet Commonly known as: PROTONIX Take 40 mg by mouth 2 (two) times daily.   Systane Complete 0.6 % Soln Generic drug: Propylene Glycol Place 1 drop into both eyes daily as needed (dry eyes).    Past Medical History:  Diagnosis Date   Abnormal stress test 04/18/2017   Acquired  hypothyroidism 07/18/2015   Last Assessment & Plan:  Formatting of this note might be different from the original. Relevant Hx: Course: Daily Update: Today's Plan:she is going to have her TSH updated for her   Electronically signed by: Mayer Camel, NP 07/22/15 2115   ADRENAL INSUFFICIENCY, HX OF 01/23/2010   Qualifier: Diagnosis of  By: Inda Castle FNP, Melissa S    Allergic rhinitis 12/12/2014   Aortic atherosclerosis ([place]) 02/18/2017   Formatting of this note might be different from the original. Noted on CT scan ([facility]) 02/2017   Arthritis    Asthma    ASTHMA 01/16/2010   Qualifier: Diagnosis of  By: Inda Castle FNP, Melissa S    Cardiomegaly 01/16/2010   Qualifier: Diagnosis of  By: Inda Castle FNP, Melissa S    Carpal tunnel syndrome on right    Coronary artery disease 07/21/2017   With mildly abnormal stress test showing small defect involving apex indicating distal LAD disease  Formatting of this note might be different from the original. Overview:  With mildly abnormal stress test showing small defect involving apex indicating distal LAD disease   COUGH, CHRONIC 01/23/2010   Qualifier: Diagnosis of  By: Inda Castle FNP, Melissa S    DEEP VENOUS THROMBOPHLEBITIS, LEG, RIGHT, HX OF 01/16/2010   Qualifier: Diagnosis of  By: Inda Castle FNP, Melissa S    Degeneration of lumbar intervertebral disc 07/18/2015   Last Assessment & Plan:  Formatting of this note might be different from the original. Relevant Hx: Course: Daily Update: Today's Plan:longstanding for her and she is still working and on her feet some days being harder for her than others, she is to again work on her weight to help with this  Electronically signed by: Mayer Camel, NP 16/10/96 0454   DIASTOLIC DYSFUNCTION 09/81/1914   Annotation: grade 2 per 2-D echo 01/26/10 Qualifier: Diagnosis of  By: Inda Castle FNP, Melissa S    Diverticulosis 10/03/2017   DYSPNEA ON EXERTION 01/16/2010   Qualifier:  Diagnosis of  By: Inda Castle FNP, Melissa S    Dyspnea on exertion 03/17/2017   Edema 01/16/2010   Qualifier: Diagnosis of  By: Inda Castle FNP, Melissa S    Embolism - blood clot 2007   right leg hx of   Enlarged heart    Essential hypertension 01/29/2010   Qualifier: Diagnosis of  By: Ronnald Ramp RN, Crystal    Last Assessment & Plan:  Formatting of this note might be different from the original. Relevant Hx: Course: Daily Update: Today's Plan:stable for her and will follow this for her  Electronically signed by: Mayer Camel, NP 07/22/15 2113   Gastroesophageal reflux disease without esophagitis 07/18/2015   Last Assessment & Plan:  Formatting of this note might be different from the original. Relevant Hx: Course: Daily Update: Today's Plan:this is  stable for her at this time and will follow her  Electronically signed by: Mayer Camel, NP 07/22/15 2116   GERD (gastroesophageal reflux disease)    GERD, SEVERE 02/01/2010   Qualifier: Diagnosis of  By: Joya Gaskins MD, Burnett Harry    Hypertension    Hypothyroidism    HYPOTHYROIDISM 01/16/2010   Qualifier: Diagnosis of  By: Inda Castle FNP, Melissa S    LIPOMA 05/15/2010   Qualifier: Diagnosis of  By: Inda Castle FNP, Melissa S    Lumbar spondylosis 02/18/2017   Formatting of this note might be different from the original. Noted on ([facility]) CT scan 02/2017   Mixed hyperlipidemia 07/18/2015   Last Assessment & Plan:  Formatting of this note might be different from the original. Relevant Hx: Course: Daily Update: Today's Plan:she is going to have her lipids updated fasting  Electronically signed by: Mayer Camel, NP 07/22/15 2112   OBESITY, MORBID 01/16/2010   Qualifier: Diagnosis of  By: Inda Castle FNP, [place]   Formatting of this note might be different from the original. Overview:  Qualifier: Diagnosis of  By: Inda Castle FNP, Melissa S   Obstructive sleep apnea syndrome 01/16/2010   last sleep study over five years  Formatting of this note might be different from the original. Overview:  Qualifier: Diagnosis of  By: Inda Castle FNP, [place]:  Formatting of this note might be different from the original. Relevant Hx: Course: Daily Update: Today's Plan:she cannot wear the mask for this and have discussed with her how this can affect her by not doing so     Osteopenia of multiple sites 06/02/2017   Formatting of this note might be different from the original. -2.3   Other acute sinusitis 01/29/2010   Qualifier: Diagnosis of  By: Joya Gaskins MD, Burnett Harry    Prediabetes 07/22/2015   Last Assessment & Plan:  Formatting of this note might be different from the original. Relevant Hx: Course: Daily Update: Today's Plan:would update her HGBA1C for her and with her use of the steroids this can be elevated for her  Electronically signed by: Mayer Camel, NP 07/22/15 2109   Right rotator cuff tear 08/09/2014   Severe persistent asthma 01/09/2015   Beth Rodriguez presents today with a one week history of wheezing and DOE and slight cough requiring her to use a SABA a few times per day. In addition, has nasal congestion and green rhinorrhea. She has been off her prednisone for over two months and was doing very well until this flare up. She continue on her Nucala and her large collection of medication for inflammation and reflux. Her reflux is under c   Shortness of breath dyspnea    with exertion   Sleep apnea    last sleep study over five years   SLEEP APNEA, OBSTRUCTIVE 01/16/2010   Qualifier: Diagnosis of  By: Inda Castle FNP, Melissa S    Thyroid disease    hypothyroidism   Vitamin D deficiency 02/02/2010   Qualifier: Diagnosis of  By: Inda Castle FNP, [place]   Formatting of this note might be different from the original. Overview:  Qualifier: Diagnosis of  By: Inda Castle FNP, [place]    Past Surgical History:  Procedure Laterality Date   CARPAL TUNNEL RELEASE Right    EYE SURGERY  Bilateral    cataract removal    KNEE SURGERY  2007   right knee   LIPOMA EXCISION Right 03/28/2019   Procedure: EXCISION SUBCUTANEOUS LIPOMA RIGHT  SHOULDER;  Surgeon: Donnie Mesa, MD;  Location: [facility];  Service: General;  Laterality: Right;   SHOULDER ARTHROSCOPY WITH OPEN ROTATOR CUFF REPAIR AND DISTAL CLAVICLE ACROMINECTOMY Right 08/09/2014   Procedure: SHOULDER ARTHROSCOPY WITH OPEN ROTATOR CUFF REPAIR AND DISTAL CLAVICLE [place];  Surgeon: Earlie Server, MD;  Location: [facility];  Service: Orthopedics;  Laterality: Right;   SINUS SURGERY WITH INSTATRAK     ???    Review of systems negative except as noted in HPI / PMHx or noted below:  Review of Systems  Constitutional: Negative.   HENT: Negative.    Eyes: Negative.   Respiratory: Negative.    Cardiovascular: Negative.   Gastrointestinal: Negative.   Genitourinary: Negative.   Musculoskeletal: Negative.   Skin: Negative.   Neurological: Negative.   Endo/Heme/Allergies: Negative.   Psychiatric/Behavioral: Negative.       Objective:   Vitals:   09/28/21 1600  BP: 126/78  Pulse: 83  Resp: 16  SpO2: 97%          Physical Exam Constitutional:      Appearance: She is not diaphoretic.  HENT:     Head: Normocephalic.     Right Ear: Tympanic membrane, ear canal and external ear normal.     Left Ear: Tympanic membrane, ear canal and external ear normal.     Nose: Nose normal. No mucosal edema or rhinorrhea.     Mouth/Throat:     Pharynx: Uvula midline. No oropharyngeal exudate.  Eyes:     Conjunctiva/sclera: Conjunctivae normal.  Neck:     Thyroid: No thyromegaly.     Trachea: Trachea normal. No tracheal tenderness or tracheal deviation.  Cardiovascular:     Rate and Rhythm: Normal rate and regular rhythm.     Heart sounds: Normal heart sounds, S1 normal and S2 normal. No murmur heard. Pulmonary:     Effort: No respiratory distress.     Breath sounds: Normal breath sounds. No stridor. No wheezing or  rales.  Lymphadenopathy:     Head:     Right side of head: No tonsillar adenopathy.     Left side of head: No tonsillar adenopathy.     Cervical: No cervical adenopathy.  Skin:    Findings: No erythema or rash.     Nails: There is no clubbing.  Neurological:     Mental Status: She is alert.     Diagnostics:    Spirometry was performed and demonstrated an FEV1 of 1.59 at 71 % of predicted.  Assessment and Plan:   1. Asthma, severe persistent, well-controlled   2. Other allergic rhinitis   3. LPRD (laryngopharyngeal reflux disease)     1. Continue Breztri - 2 inhalations 2 times per day    2. Continue Benralizumab injections  3. Continue nasonex 1 spray each nostril 1-2 times per day.   4. Continue pantoprazole '40mg'$  1-2 times per day  5. Continue Proair HFA or Albuterol nebulization if needed.  7. Can add OTC antihistamine - Zyrtec / Allegra / Claritin, nasal saline if needed  8. Return to clinic in 6 months or earlier if problem  Overall Beth Rodriguez been doing pretty well on her current plan of anti-inflammatory agents including use of a anti-IL-5 biologic agent and therapy directed against reflux as noted above.  We will keep her on this plan for an additional 6 months and see her in this clinic at that point in time or earlier if there is a problem.  Beth Katz, MD Allergy / Immunology  [place]

## 2021-09-28 NOTE — Patient Instructions (Addendum)
  1. Continue Breztri - 2 inhalations 2 times per day    2. Continue Benralizumab injections  3. Continue nasonex 1 spray each nostril 1-2 times per day.   4. Continue pantoprazole '40mg'$  1-2 times per day  5. Continue Proair HFA or Albuterol nebulization if needed.  7. Can add OTC antihistamine - Zyrtec / Allegra / Claritin, nasal saline if needed  8. Return to clinic in 6 months or earlier if problem

## 2021-09-29 ENCOUNTER — Encounter: Payer: Self-pay | Admitting: Allergy and Immunology

## 2021-10-07 ENCOUNTER — Ambulatory Visit: Payer: 59 | Admitting: Cardiology

## 2021-10-07 ENCOUNTER — Encounter: Payer: Self-pay | Admitting: Cardiology

## 2021-10-07 VITALS — BP 138/74 | HR 88 | Ht 61.0 in | Wt 290.0 lb

## 2021-10-07 DIAGNOSIS — E039 Hypothyroidism, unspecified: Secondary | ICD-10-CM | POA: Diagnosis not present

## 2021-10-07 DIAGNOSIS — R9439 Abnormal result of other cardiovascular function study: Secondary | ICD-10-CM | POA: Diagnosis not present

## 2021-10-07 DIAGNOSIS — I2510 Atherosclerotic heart disease of native coronary artery without angina pectoris: Secondary | ICD-10-CM | POA: Diagnosis not present

## 2021-10-07 DIAGNOSIS — R0609 Other forms of dyspnea: Secondary | ICD-10-CM

## 2021-10-07 DIAGNOSIS — I10 Essential (primary) hypertension: Secondary | ICD-10-CM

## 2021-10-07 DIAGNOSIS — I5032 Chronic diastolic (congestive) heart failure: Secondary | ICD-10-CM

## 2021-10-07 NOTE — Progress Notes (Signed)
Cardiology Office Note:    Date:  10/07/2021   ID:  JUDENE LOGUE, DOB 1960-12-20, MRN [id number]  PCP:  [name], MD  Cardiologist:  Jenne Campus, MD    Referring MD: [name], MD   Chief Complaint  Patient presents with   Follow-up  Doing fine  History of Present Illness:    Beth Rodriguez is a 61 y.o. female with past medical history significant for coronary artery disease in 2018 she had stress test done which showed ischemia involving apex that was done for atypical chest pain.  Since that time she is doing great she does not want to have a cardiac catheterization she denies have any chest pain tightness squeezing pressure burning chest.  Additional problems include morbid obesity, dyslipidemia, bronchial asthma.  Past Medical History:  Diagnosis Date   Abnormal stress test 04/18/2017   Acquired hypothyroidism 07/18/2015   Last Assessment & Plan:  Formatting of this note might be different from the original. Relevant Hx: Course: Daily Update: Today's Plan:she is going to have her TSH updated for her   Electronically signed by: Mayer Camel, NP 07/22/15 2115   ADRENAL INSUFFICIENCY, HX OF 01/23/2010   Qualifier: Diagnosis of  By: Inda Castle FNP, Melissa S    Allergic rhinitis 12/12/2014   Aortic atherosclerosis ([place]) 02/18/2017   Formatting of this note might be different from the original. Noted on CT scan ([facility]) 02/2017   Arthritis    Asthma    ASTHMA 01/16/2010   Qualifier: Diagnosis of  By: Inda Castle FNP, Melissa S    Cardiomegaly 01/16/2010   Qualifier: Diagnosis of  By: Inda Castle FNP, Melissa S    Carpal tunnel syndrome on right    Coronary artery disease 07/21/2017   With mildly abnormal stress test showing small defect involving apex indicating distal LAD disease  Formatting of this note might be different from the original. Overview:  With mildly abnormal stress test showing small defect involving apex indicating distal LAD disease    COUGH, CHRONIC 01/23/2010   Qualifier: Diagnosis of  By: Inda Castle FNP, Melissa S    DEEP VENOUS THROMBOPHLEBITIS, LEG, RIGHT, HX OF 01/16/2010   Qualifier: Diagnosis of  By: Inda Castle FNP, Melissa S    Degeneration of lumbar intervertebral disc 07/18/2015   Last Assessment & Plan:  Formatting of this note might be different from the original. Relevant Hx: Course: Daily Update: Today's Plan:longstanding for her and she is still working and on her feet some days being harder for her than others, she is to again work on her weight to help with this  Electronically signed by: Mayer Camel, NP 78/29/56 2130   DIASTOLIC DYSFUNCTION 86/57/8469   Annotation: grade 2 per 2-D echo 01/26/10 Qualifier: Diagnosis of  By: Inda Castle FNP, Melissa S    Diverticulosis 10/03/2017   DYSPNEA ON EXERTION 01/16/2010   Qualifier: Diagnosis of  By: Inda Castle FNP, Melissa S    Dyspnea on exertion 03/17/2017   Edema 01/16/2010   Qualifier: Diagnosis of  By: Inda Castle FNP, Melissa S    Embolism - blood clot 2007   right leg hx of   Enlarged heart    Essential hypertension 01/29/2010   Qualifier: Diagnosis of  By: Ronnald Ramp RN, Crystal    Last Assessment & Plan:  Formatting of this note might be different from the original. Relevant Hx: Course: Daily Update: Today's Plan:stable for her and will follow this for her  Electronically signed by: Mayer Camel, NP  07/22/15 2113   Gastroesophageal reflux disease without esophagitis 07/18/2015   Last Assessment & Plan:  Formatting of this note might be different from the original. Relevant Hx: Course: Daily Update: Today's Plan:this is stable for her at this time and will follow her  Electronically signed by: Mayer Camel, NP 07/22/15 2116   GERD (gastroesophageal reflux disease)    GERD, SEVERE 02/01/2010   Qualifier: Diagnosis of  By: Joya Gaskins MD, Burnett Harry    Hypertension    Hypothyroidism    HYPOTHYROIDISM 01/16/2010   Qualifier:  Diagnosis of  By: Inda Castle FNP, Melissa S    LIPOMA 05/15/2010   Qualifier: Diagnosis of  By: Inda Castle FNP, Melissa S    Lumbar spondylosis 02/18/2017   Formatting of this note might be different from the original. Noted on ([facility]) CT scan 02/2017   Mixed hyperlipidemia 07/18/2015   Last Assessment & Plan:  Formatting of this note might be different from the original. Relevant Hx: Course: Daily Update: Today's Plan:she is going to have her lipids updated fasting  Electronically signed by: Mayer Camel, NP 07/22/15 2112   OBESITY, MORBID 01/16/2010   Qualifier: Diagnosis of  By: Inda Castle FNP, [place]   Formatting of this note might be different from the original. Overview:  Qualifier: Diagnosis of  By: Inda Castle FNP, Melissa S   Obstructive sleep apnea syndrome 01/16/2010   last sleep study over five years Formatting of this note might be different from the original. Overview:  Qualifier: Diagnosis of  By: Inda Castle FNP, [place]:  Formatting of this note might be different from the original. Relevant Hx: Course: Daily Update: Today's Plan:she cannot wear the mask for this and have discussed with her how this can affect her by not doing so     Osteopenia of multiple sites 06/02/2017   Formatting of this note might be different from the original. -2.3   Other acute sinusitis 01/29/2010   Qualifier: Diagnosis of  By: Joya Gaskins MD, Burnett Harry    Prediabetes 07/22/2015   Last Assessment & Plan:  Formatting of this note might be different from the original. Relevant Hx: Course: Daily Update: Today's Plan:would update her HGBA1C for her and with her use of the steroids this can be elevated for her  Electronically signed by: Mayer Camel, NP 07/22/15 2109   Right rotator cuff tear 08/09/2014   Severe persistent asthma 01/09/2015   Lyndel presents today with a one week history of wheezing and DOE and slight cough requiring her to use a SABA a few  times per day. In addition, has nasal congestion and green rhinorrhea. She has been off her prednisone for over two months and was doing very well until this flare up. She continue on her Nucala and her large collection of medication for inflammation and reflux. Her reflux is under c   Shortness of breath dyspnea    with exertion   Sleep apnea    last sleep study over five years   SLEEP APNEA, OBSTRUCTIVE 01/16/2010   Qualifier: Diagnosis of  By: Inda Castle FNP, Melissa S    Thyroid disease    hypothyroidism   Vitamin D deficiency 02/02/2010   Qualifier: Diagnosis of  By: Inda Castle FNP, Wellington [place]   Formatting of this note might be different from the original. Overview:  Qualifier: Diagnosis of  By: Inda Castle FNP, Wellington [place]    Past Surgical History:  Procedure Laterality Date   CARPAL TUNNEL RELEASE Right  EYE SURGERY Bilateral    cataract removal    KNEE SURGERY  2007   right knee   LIPOMA EXCISION Right 03/28/2019   Procedure: EXCISION SUBCUTANEOUS LIPOMA RIGHT SHOULDER;  Surgeon: Donnie Mesa, MD;  Location: [facility];  Service: General;  Laterality: Right;   SHOULDER ARTHROSCOPY WITH OPEN ROTATOR CUFF REPAIR AND DISTAL CLAVICLE ACROMINECTOMY Right 08/09/2014   Procedure: SHOULDER ARTHROSCOPY WITH OPEN ROTATOR CUFF REPAIR AND DISTAL CLAVICLE [place];  Surgeon: Earlie Server, MD;  Location: [facility];  Service: Orthopedics;  Laterality: Right;   SINUS SURGERY WITH INSTATRAK     ???    Current Medications: Current Meds  Medication Sig   albuterol (PROVENTIL) (2.5 MG/3ML) 0.083% nebulizer solution Take 2.5 mg by nebulization every 6 (six) hours as needed for wheezing or shortness of breath.   albuterol (VENTOLIN HFA) 108 (90 Base) MCG/ACT inhaler INHALE TWO PUFFS EVERY 4-6 HOURS IF NEEDED FOR COUGH OR WHEEZE. (Patient taking differently: Inhale 1 puff into the lungs every 6 (six) hours as needed for wheezing or shortness of breath. INHALE TWO PUFFS EVERY 4-6 HOURS IF NEEDED FOR  COUGH OR WHEEZE.)   aspirin EC 81 MG tablet Take 81 mg by mouth daily.    atorvastatin (LIPITOR) 10 MG tablet Take 1 tablet (10 mg total) by mouth daily.   AUVI-Q 0.3 MG/0.3ML SOAJ injection Use as directed for life-threatening allergic reaction. (Patient taking differently: Inject 0.3 mg into the muscle as needed for anaphylaxis. Use as directed for life-threatening allergic reaction.)   Benralizumab (FASENRA PEN) 30 MG/ML SOAJ Inject 1 mL (30 mg total) into the skin every 8 (eight) weeks.   Budeson-Glycopyrrol-Formoterol (BREZTRI AEROSPHERE) 160-9-4.8 MCG/ACT AERO Inhale 2 puffs into the lungs 2 (two) times daily.   hydrochlorothiazide (MICROZIDE) 12.5 MG capsule TAKE 1 CAPSULE BY MOUTH EVERY DAY (Patient taking differently: Take 12.5 mg by mouth daily.)   ibandronate (BONIVA) 150 MG tablet Take 1 tablet by mouth every 28 (twenty-eight) days.   levalbuterol (XOPENEX HFA) 45 MCG/ACT inhaler INHALE 2 PUFFS EVERY 4 HOURS AS NEEDED FOR COUGH OR WHEEZE (Patient taking differently: Inhale 2 puffs into the lungs every 4 (four) hours as needed for wheezing or shortness of breath. INHALE 2 PUFFS EVERY 4 HOURS AS NEEDED FOR COUGH OR WHEEZE)   levothyroxine (SYNTHROID, LEVOTHROID) 88 MCG tablet Take 88 mcg by mouth daily before breakfast.    loratadine (CLARITIN) 10 MG tablet Take 10 mg by mouth daily as needed for allergies. Reported on 07/21/2015   mometasone (NASONEX) 50 MCG/ACT nasal spray Use one spray in each nostril one to two times per days. (Patient taking differently: Place 2 sprays into the nose daily. Use one spray in each nostril one to two times per days.)   pantoprazole (PROTONIX) 40 MG tablet Take 40 mg by mouth 2 (two) times daily.   Propylene Glycol (SYSTANE COMPLETE) 0.6 % SOLN Place 1 drop into both eyes daily as needed (dry eyes).   Current Facility-Administered Medications for the 10/07/21 encounter (Office Visit) with Park Liter, MD  Medication   Benralizumab SOSY 30 mg      Allergies:   Penicillins, Theophyllines, and Theophylline   Social History   Socioeconomic History   Marital status: Married    Spouse name: Not on file   Number of children: 1   Years of education: Not on file   Highest education level: Not on file  Occupational History   Occupation: PRODUCTION ASSEMBLY    Employer: [employer]  Tobacco Use   Smoking status: Never   Smokeless tobacco: Never  Vaping Use   Vaping Use: Never used  Substance and Sexual Activity   Alcohol use: No   Drug use: No   Sexual activity: Not on file  Other Topics Concern   Not on file  Social History Narrative   Not on file   Social Determinants of Health   Financial Resource Strain: Not on file  Food Insecurity: Not on file  Transportation Needs: Not on file  Physical Activity: Not on file  Stress: Not on file  Social Connections: Not on file     Family History: The patient's family history includes Cancer in her sister; Carpal tunnel syndrome in her daughter and sister; Diabetes in her sister; Hyperlipidemia in her sister; Hypertension in her brother, daughter, mother, and sister. ROS:   Please see the history of present illness.    All 14 point review of systems negative except as described per history of present illness  EKGs/Labs/Other Studies Reviewed:      Recent Labs: No results found for requested labs within last 365 days.  Recent Lipid Panel    Component Value Date/Time   CHOL 137 07/21/2017 1015   TRIG 44 07/21/2017 1015   HDL 62 07/21/2017 1015   CHOLHDL 2.2 07/21/2017 1015   LDLCALC 66 07/21/2017 1015    Physical Exam:    VS:  BP 138/74 (BP Location: Left Arm, Patient Position: Sitting)   Pulse 88   Ht '5\' 1"'$  (1.549 m)   Wt 290 lb (131.5 kg)   SpO2 96%   BMI 54.80 kg/m     Wt Readings from Last 3 Encounters:  10/07/21 290 lb (131.5 kg)  01/06/21 290 lb 9.6 oz (131.8 kg)  12/22/20 284 lb 3.2 oz (128.9 kg)     GEN:  Well nourished, well developed in  no acute distress HEENT: Normal NECK: No JVD; No carotid bruits LYMPHATICS: No lymphadenopathy CARDIAC: RRR, no murmurs, no rubs, no gallops RESPIRATORY:  Clear to auscultation without rales, wheezing or rhonchi  ABDOMEN: Soft, non-tender, non-distended MUSCULOSKELETAL:  No edema; No deformity  SKIN: Warm and dry LOWER EXTREMITIES: no swelling NEUROLOGIC:  Alert and oriented x 3 PSYCHIATRIC:  Normal affect   ASSESSMENT:    1. Coronary artery disease involving native coronary artery of native heart without angina pectoris   2. Abnormal stress test   3. Dyspnea on exertion   4. Acquired hypothyroidism   5. Primary hypertension   6. Chronic diastolic congestive heart failure (HCC)    PLAN:    In order of problems listed above:  Coronary disease abnormal stress test however she is completely asymptomatic does not want to pursue any aggressive management.  She is on antiplatelet therapy as well as statin which I will continue. Dyslipidemia: She is taking Lipitor 10 which I will continue.  I do not have her last fasting lipid profile K PN show me her cholesterol from 2019 which was actually pretty good LDL 66 HDL 62.  We will call primary care physician to get copy of her updated cholesterol Hypothyroidism, followed by internal medicine team Essential hypertension blood pressure well controlled continue present management Morbid obesity: Obesity problem she understand that she started working at   Medication Adjustments/Labs and Tests Ordered: Current medicines are reviewed at length with the patient today.  Concerns regarding medicines are outlined above.  Orders Placed This Encounter  Procedures   EKG 12-Lead   Medication changes: No orders  of the defined types were placed in this encounter.   Signed, Park Liter, MD, [facility] 10/07/2021 11:35 AM    [place]

## 2021-10-07 NOTE — Patient Instructions (Signed)
Medication Instructions:  Your physician recommends that you continue on your current medications as directed. Please refer to the Current Medication list given to you today.  *If you need a refill on your cardiac medications before your next appointment, please call your pharmacy*   Lab Work: None If you have labs (blood work) drawn today and your tests are completely normal, you will receive your results only by: [place] (if you have MyChart) OR A paper copy in the mail If you have any lab test that is abnormal or we need to change your treatment, we will call you to review the results.   Testing/Procedures: None   Follow-Up: At [facility], you and your health needs are our priority.  As part of our continuing mission to provide you with exceptional heart care, we have created designated Provider Care Teams.  These Care Teams include your primary Cardiologist (physician) and Advanced Practice Providers (APPs -  Physician Assistants and Nurse Practitioners) who all work together to provide you with the care you need, when you need it.  We recommend signing up for the patient portal called "MyChart".  Sign up information is provided on this After Visit Summary.  MyChart is used to connect with patients for Virtual Visits (Telemedicine).  Patients are able to view lab/test results, encounter notes, upcoming appointments, etc.  Non-urgent messages can be sent to your provider as well.   To learn more about what you can do with MyChart, go to [email or web address].    Your next appointment:   1 year(s)  The format for your next appointment:   In Person  Provider:   Jenne Campus, MD    Other Instructions None  Important Information About Sugar

## 2021-10-16 ENCOUNTER — Ambulatory Visit
Admission: RE | Admit: 2021-10-16 | Discharge: 2021-10-16 | Disposition: A | Discharge status: Home / Self Care | Payer: 59 | Source: Ambulatory Visit | Attending: Neurosurgery | Admitting: Neurosurgery

## 2021-10-16 DIAGNOSIS — M5440 Lumbago with sciatica, unspecified side: Secondary | ICD-10-CM

## 2021-11-10 ENCOUNTER — Ambulatory Visit (INDEPENDENT_AMBULATORY_CARE_PROVIDER_SITE_OTHER): Payer: 59 | Admitting: *Deleted

## 2021-11-10 DIAGNOSIS — J4550 Severe persistent asthma, uncomplicated: Secondary | ICD-10-CM | POA: Diagnosis not present

## 2021-11-11 ENCOUNTER — Ambulatory Visit: Payer: 59

## 2021-11-27 ENCOUNTER — Encounter: Payer: Self-pay | Admitting: Cardiology

## 2021-11-27 ENCOUNTER — Ambulatory Visit (INDEPENDENT_AMBULATORY_CARE_PROVIDER_SITE_OTHER): Payer: 59

## 2021-11-27 ENCOUNTER — Telehealth (HOSPITAL_BASED_OUTPATIENT_CLINIC_OR_DEPARTMENT_OTHER): Payer: Self-pay

## 2021-11-27 ENCOUNTER — Ambulatory Visit (INDEPENDENT_AMBULATORY_CARE_PROVIDER_SITE_OTHER): Payer: 59 | Admitting: Cardiology

## 2021-11-27 VITALS — BP 138/80 | HR 80 | Ht 61.0 in | Wt 286.0 lb

## 2021-11-27 DIAGNOSIS — R9439 Abnormal result of other cardiovascular function study: Secondary | ICD-10-CM

## 2021-11-27 DIAGNOSIS — R002 Palpitations: Secondary | ICD-10-CM

## 2021-11-27 DIAGNOSIS — G4733 Obstructive sleep apnea (adult) (pediatric): Secondary | ICD-10-CM

## 2021-11-27 DIAGNOSIS — I10 Essential (primary) hypertension: Secondary | ICD-10-CM | POA: Diagnosis not present

## 2021-11-27 DIAGNOSIS — I2510 Atherosclerotic heart disease of native coronary artery without angina pectoris: Secondary | ICD-10-CM | POA: Diagnosis not present

## 2021-11-27 NOTE — Addendum Note (Signed)
Addended by: Jacobo Forest D on: 11/27/2021 08:51 AM   Modules accepted: Orders

## 2021-11-27 NOTE — Progress Notes (Signed)
Cardiology Office Note:    Date:  11/27/2021   ID:  Beth Rodriguez, DOB 03/12/1961, MRN [id number]  PCP:  Beth Mina., MD  Cardiologist:  Jenne Campus, MD    Referring MD: Beth Mina., MD   No chief complaint on file. I am doing fine  History of Present Illness:    Beth Rodriguez is a 61 y.o. female with past medical history significant for essential hypertension, dyslipidemia, morbid obesity, she did have a stress test done in 2018 showing small area of ischemia, however she is completely asymptomatic, denies have any chest pain tightness squeezing pressure burning chest therefore she decided not to pursue cardiac catheterization.  She came to [place] today because she is getting ready to work for  [employer] one of the requirements was physical exam during the physical exam she was noted to have some extrasystole and she was referred to [place] for evaluation.  She denies having any dizziness passing out.  There is no palpitations that she feels.  Denies having any typical chest pain tightness squeezing pressure burning chest.  Past Medical History:  Diagnosis Date   Abnormal stress test 04/18/2017   Acquired hypothyroidism 07/18/2015   Last Assessment & Plan:  Formatting of this note might be different from the original. Relevant Hx: Course: Daily Update: Today'Rodriguez Plan:she is going to have her TSH updated for her   Electronically signed by: Mayer Camel, NP 07/22/15 2115   ADRENAL INSUFFICIENCY, HX OF 01/23/2010   Qualifier: Diagnosis of  By: Beth Rodriguez, Beth Rodriguez    Allergic rhinitis 12/12/2014   Aortic atherosclerosis ([place]) 02/18/2017   Formatting of this note might be different from the original. Noted on CT scan ([facility]) 02/2017   Arthritis    Asthma    ASTHMA 01/16/2010   Qualifier: Diagnosis of  By: Beth Rodriguez, Beth Rodriguez    Cardiomegaly 01/16/2010   Qualifier: Diagnosis of  By: Beth Rodriguez, Beth Rodriguez    Carpal tunnel syndrome on right    Coronary  artery disease 07/21/2017   With mildly abnormal stress test showing small defect involving apex indicating distal LAD disease  Formatting of this note might be different from the original. Overview:  With mildly abnormal stress test showing small defect involving apex indicating distal LAD disease   COUGH, CHRONIC 01/23/2010   Qualifier: Diagnosis of  By: Beth Rodriguez, Beth Rodriguez    DEEP VENOUS THROMBOPHLEBITIS, LEG, RIGHT, HX OF 01/16/2010   Qualifier: Diagnosis of  By: Beth Rodriguez, Beth Rodriguez    Degeneration of lumbar intervertebral disc 07/18/2015   Last Assessment & Plan:  Formatting of this note might be different from the original. Relevant Hx: Course: Daily Update: Today'Rodriguez Plan:longstanding for her and she is still working and on her feet some days being harder for her than others, she is to again work on her weight to help with this  Electronically signed by: Mayer Camel, NP 32/44/01 0272   DIASTOLIC DYSFUNCTION 53/66/4403   Annotation: grade 2 per 2-D echo 01/26/10 Qualifier: Diagnosis of  By: Beth Rodriguez, Beth Rodriguez    Diverticulosis 10/03/2017   DYSPNEA ON EXERTION 01/16/2010   Qualifier: Diagnosis of  By: Beth Rodriguez, Beth Rodriguez    Dyspnea on exertion 03/17/2017   Edema 01/16/2010   Qualifier: Diagnosis of  By: Beth Rodriguez, Beth Rodriguez    Embolism - blood clot 2007   right leg hx of   Enlarged heart    Essential hypertension 01/29/2010  Qualifier: Diagnosis of  By: Ronnald Ramp Rodriguez, [place]    Last Assessment & Plan:  Formatting of this note might be different from the original. Relevant Hx: Course: Daily Update: Today'Rodriguez Plan:stable for her and will follow this for her  Electronically signed by: Mayer Camel, NP 07/22/15 2113   Gastroesophageal reflux disease without esophagitis 07/18/2015   Last Assessment & Plan:  Formatting of this note might be different from the original. Relevant Hx: Course: Daily Update: Today'Rodriguez Plan:this is stable for her at this time  and will follow her  Electronically signed by: Mayer Camel, NP 07/22/15 2116   GERD (gastroesophageal reflux disease)    GERD, SEVERE 02/01/2010   Qualifier: Diagnosis of  By: Joya Gaskins MD, Burnett Harry    Hypertension    Hypothyroidism    HYPOTHYROIDISM 01/16/2010   Qualifier: Diagnosis of  By: Beth Rodriguez, Beth Rodriguez    LIPOMA 05/15/2010   Qualifier: Diagnosis of  By: Beth Rodriguez, Beth Rodriguez    Lumbar spondylosis 02/18/2017   Formatting of this note might be different from the original. Noted on ([facility]) CT scan 02/2017   Mixed hyperlipidemia 07/18/2015   Last Assessment & Plan:  Formatting of this note might be different from the original. Relevant Hx: Course: Daily Update: Today'Rodriguez Plan:she is going to have her lipids updated fasting  Electronically signed by: Mayer Camel, NP 07/22/15 2112   OBESITY, MORBID 01/16/2010   Qualifier: Diagnosis of  By: Beth Rodriguez, Beth Rodriguez   Formatting of this note might be different from the original. Overview:  Qualifier: Diagnosis of  By: Beth Rodriguez, Beth Rodriguez   Obstructive sleep apnea syndrome 01/16/2010   last sleep study over five years Formatting of this note might be different from the original. Overview:  Qualifier: Diagnosis of  By: Beth Rodriguez, Beth Rodriguez:  Formatting of this note might be different from the original. Relevant Hx: Course: Daily Update: Today'Rodriguez Plan:she cannot wear the mask for this and have discussed with her how this can affect her by not doing so     Osteopenia of multiple sites 06/02/2017   Formatting of this note might be different from the original. -2.3   Other acute sinusitis 01/29/2010   Qualifier: Diagnosis of  By: Joya Gaskins MD, Burnett Harry    Prediabetes 07/22/2015   Last Assessment & Plan:  Formatting of this note might be different from the original. Relevant Hx: Course: Daily Update: Today'Rodriguez Plan:would update her HGBA1C for her and with her use of the steroids this  can be elevated for her  Electronically signed by: Mayer Camel, NP 07/22/15 2109   Right rotator cuff tear 08/09/2014   Severe persistent asthma 01/09/2015   Aamya presents today with a one week history of wheezing and DOE and slight cough requiring her to use a SABA a few times per day. In addition, has nasal congestion and green rhinorrhea. She has been off her prednisone for over two months and was doing very well until this flare up. She continue on her Nucala and her large collection of medication for inflammation and reflux. Her reflux is under c   Shortness of breath dyspnea    with exertion   Sleep apnea    last sleep study over five years   SLEEP APNEA, OBSTRUCTIVE 01/16/2010   Qualifier: Diagnosis of  By: Beth Rodriguez, Beth Rodriguez    Thyroid disease    hypothyroidism   Vitamin D deficiency 02/02/2010  Qualifier: Diagnosis of  By: Beth Rodriguez, [place] of this note might be different from the original. Overview:  Qualifier: Diagnosis of  By: Beth Rodriguez, Beth Rodriguez    Past Surgical History:  Procedure Laterality Date   CARPAL TUNNEL RELEASE Right    EYE SURGERY Bilateral    cataract removal    KNEE SURGERY  2007   right knee   LIPOMA EXCISION Right 03/28/2019   Procedure: EXCISION SUBCUTANEOUS LIPOMA RIGHT SHOULDER;  Surgeon: Donnie Mesa, MD;  Location: [place];  Service: General;  Laterality: Right;   SHOULDER ARTHROSCOPY WITH OPEN ROTATOR CUFF REPAIR AND DISTAL CLAVICLE ACROMINECTOMY Right 08/09/2014   Procedure: SHOULDER ARTHROSCOPY WITH OPEN ROTATOR CUFF REPAIR AND DISTAL CLAVICLE [place];  Surgeon: Earlie Server, MD;  Location: [place];  Service: Orthopedics;  Laterality: Right;   SINUS SURGERY WITH INSTATRAK     ???    Current Medications: Current Meds  Medication Sig   albuterol (VENTOLIN HFA) 108 (90 Base) MCG/ACT inhaler INHALE TWO PUFFS EVERY 4-6 HOURS IF NEEDED FOR COUGH OR WHEEZE. (Patient taking differently: Inhale 1 puff  into the lungs every 6 (six) hours as needed for wheezing or shortness of breath. INHALE TWO PUFFS EVERY 4-6 HOURS IF NEEDED FOR COUGH OR WHEEZE.)   aspirin EC 81 MG tablet Take 81 mg by mouth daily.    atorvastatin (LIPITOR) 10 MG tablet Take 1 tablet (10 mg total) by mouth daily.   AUVI-Q 0.3 MG/0.3ML SOAJ injection Use as directed for life-threatening allergic reaction. (Patient taking differently: Inject 0.3 mg into the muscle as needed for anaphylaxis. Use as directed for life-threatening allergic reaction.)   Benralizumab (FASENRA PEN) 30 MG/ML SOAJ Inject 1 mL (30 mg total) into the skin every 8 (eight) weeks.   Budeson-Glycopyrrol-Formoterol (BREZTRI AEROSPHERE) 160-9-4.8 MCG/ACT AERO Inhale 2 puffs into the lungs 2 (two) times daily.   hydrochlorothiazide (MICROZIDE) 12.5 MG capsule TAKE 1 CAPSULE BY MOUTH EVERY DAY (Patient taking differently: Take 12.5 mg by mouth daily.)   ibandronate (BONIVA) 150 MG tablet Take 1 tablet by mouth every 28 (twenty-eight) days.   levothyroxine (SYNTHROID, LEVOTHROID) 88 MCG tablet Take 88 mcg by mouth daily before breakfast.    loratadine (CLARITIN) 10 MG tablet Take 10 mg by mouth daily as needed for allergies. Reported on 07/21/2015   losartan (COZAAR) 100 MG tablet Take 100 mg by mouth daily.   mometasone (NASONEX) 50 MCG/ACT nasal spray Use one spray in each nostril one to two times per days. (Patient taking differently: Place 2 sprays into the nose daily. Use one spray in each nostril one to two times per days.)   pantoprazole (PROTONIX) 40 MG tablet Take 40 mg by mouth 2 (two) times daily.   Propylene Glycol (SYSTANE COMPLETE) 0.6 % SOLN Place 1 drop into both eyes daily as needed (dry eyes).   tiZANidine (ZANAFLEX) 4 MG tablet Take 4 mg by mouth every 6 (six) hours as needed for muscle spasms.   Current Facility-Administered Medications for the 11/27/21 encounter (Office Visit) with Park Liter, MD  Medication   Benralizumab SOSY 30 mg      Allergies:   Penicillins, Theophyllines, and Theophylline   Social History   Socioeconomic History   Marital status: Married    Spouse name: Not on file   Number of children: 1   Years of education: Not on file   Highest education level: Not on file  Occupational History   Occupation: PRODUCTION ASSEMBLY  Employer: [employer]  Tobacco Use   Smoking status: Never   Smokeless tobacco: Never  Vaping Use   Vaping Use: Never used  Substance and Sexual Activity   Alcohol use: No   Drug use: No   Sexual activity: Not on file  Other Topics Concern   Not on file  Social History Narrative   Not on file   Social Determinants of Health   Financial Resource Strain: Not on file  Food Insecurity: Not on file  Transportation Needs: Not on file  Physical Activity: Not on file  Stress: Not on file  Social Connections: Not on file     Family History: The patient'Rodriguez family history includes Cancer in her sister; Carpal tunnel syndrome in her daughter and sister; Diabetes in her sister; Hyperlipidemia in her sister; Hypertension in her brother, daughter, mother, and sister. ROS:   Please see the history of present illness.    All 14 point review of systems negative except as described per history of present illness  EKGs/Labs/Other Studies Reviewed:      Recent Labs: No results found for requested labs within last 365 days.  Recent Lipid Panel    Component Value Date/Time   CHOL 137 07/21/2017 1015   TRIG 44 07/21/2017 1015   HDL 62 07/21/2017 1015   CHOLHDL 2.2 07/21/2017 1015   LDLCALC 66 07/21/2017 1015    Physical Exam:    VS:  BP 138/80 (BP Location: Left Arm, Patient Position: Sitting, Cuff Size: Large)   Pulse 80   Ht '5\' 1"'$  (1.549 m)   Wt 286 lb (129.7 kg)   SpO2 97%   BMI 54.04 kg/m     Wt Readings from Last 3 Encounters:  11/27/21 286 lb (129.7 kg)  10/07/21 290 lb (131.5 kg)  01/06/21 290 lb 9.6 oz (131.8 kg)     GEN:  Well nourished, well  developed in no acute distress HEENT: Normal NECK: No JVD; No carotid bruits LYMPHATICS: No lymphadenopathy CARDIAC: RRR, no murmurs, no rubs, no gallops RESPIRATORY:  Clear to auscultation without rales, wheezing or rhonchi  ABDOMEN: Soft, non-tender, non-distended MUSCULOSKELETAL:  No edema; No deformity  SKIN: Warm and dry LOWER EXTREMITIES: no swelling NEUROLOGIC:  Alert and oriented x 3 PSYCHIATRIC:  Normal affect   ASSESSMENT:    1. Coronary artery disease involving native coronary artery of native heart without angina pectoris   2. Primary hypertension   3. SLEEP APNEA, OBSTRUCTIVE   4. Abnormal stress test    PLAN:    In order of problems listed above:  Coronary artery disease.  Stress test abnormal in 2019 but lady is completely asymptomatic.  She is on antiplatelet therapy which I will continue we will debating today how we can evaluate her coronary arteries and I think the best step will be to do calcium score.  If calcium score is very elevated then will do either coronary CT angio pulm may be another stress test.  In the meantime we will continue antiplatelet therapy and cholesterol-lowering therapy. Essential hypertension blood pressures well controlled continue present management. Sleep apnea that being follow-up by internal medicine team Abnormal stress test: Plan as described above Dyslipidemia I did review her cholesterol done 3 months ago by primary care physician which her LDL is 72.  We will continue present management Irregularity of heartbeats that I can hear on my physical exam, will do EKG, she will wear also Zio patch for 1 week.  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  Concerns regarding medicines are outlined above.  No orders of  the defined types were placed in this encounter.  Medication changes: No orders of the defined types were placed in this encounter.   Signed, Park Liter, MD, [facility] 11/27/2021 8:35 AM    [place]

## 2021-11-27 NOTE — Patient Instructions (Signed)
Medication Instructions:  Your physician recommends that you continue on your current medications as directed. Please refer to the Current Medication list given to you today.  *If you need a refill on your cardiac medications before your next appointment, please call your pharmacy*   Lab Work: None Ordered If you have labs (blood work) drawn today and your tests are completely normal, you will receive your results only by: [place] (if you have MyChart) OR A paper copy in the mail If you have any lab test that is abnormal or we need to change your treatment, we will call you to review the results.   Testing/Procedures:  WHY IS MY DOCTOR PRESCRIBING ZIO? The Zio system is proven and trusted by physicians to detect and diagnose irregular heart rhythms -- and has been prescribed to hundreds of thousands of patients.  The FDA has cleared the Zio system to monitor for many different kinds of irregular heart rhythms. In a study, physicians were able to reach a diagnosis 90% of the time with the Zio system1.  You can wear the Zio monitor -- a small, discreet, comfortable patch -- during your normal day-to-day activity, including while you sleep, shower, and exercise, while it records every single heartbeat for analysis.  1Barrett, P., et al. Comparison of 24 Hour Holter Monitoring Versus 14 Day Novel Adhesive Patch Electrocardiographic Monitoring. [place], 2014.  ZIO VS. HOLTER MONITORING The Zio monitor can be comfortably worn for up to 14 days. Holter monitors can be worn for 24 to 48 hours, limiting the time to record any irregular heart rhythms you may have. Zio is able to capture data for the 51% of patients who have their first symptom-triggered arrhythmia after 48 hours.1  LIVE WITHOUT RESTRICTIONS The Zio ambulatory cardiac monitor is a small, unobtrusive, and water-resistant patch--you might even forget you're wearing it. The Zio monitor records and stores  every beat of your heart, whether you're sleeping, working out, or showering.    We will order CT coronary calcium score. It will cost $99.00 and is not covered by insurance.  Please call to schedule.     [facility] [address] [place], [place] [address] [phone]     Follow-Up: At [facility], you and your health needs are our priority.  As part of our continuing mission to provide you with exceptional heart care, we have created designated Provider Care Teams.  These Care Teams include your primary Cardiologist (physician) and Advanced Practice Providers (APPs -  Physician Assistants and Nurse Practitioners) who all work together to provide you with the care you need, when you need it.  We recommend signing up for the patient portal called "MyChart".  Sign up information is provided on this After Visit Summary.  MyChart is used to connect with patients for Virtual Visits (Telemedicine).  Patients are able to view lab/test results, encounter notes, upcoming appointments, etc.  Non-urgent messages can be sent to your provider as well.   To learn more about what you can do with MyChart, go to [email or web address].    Your next appointment:   6 month(s)  The format for your next appointment:   In Person  Provider:   Jenne Campus, MD    Other Instructions NA

## 2021-12-08 ENCOUNTER — Ambulatory Visit (HOSPITAL_BASED_OUTPATIENT_CLINIC_OR_DEPARTMENT_OTHER)
Admission: RE | Admit: 2021-12-08 | Discharge: 2021-12-08 | Disposition: A | Discharge status: Home / Self Care | Payer: 59 | Source: Ambulatory Visit | Attending: Cardiology | Admitting: Cardiology

## 2021-12-08 DIAGNOSIS — I2510 Atherosclerotic heart disease of native coronary artery without angina pectoris: Secondary | ICD-10-CM | POA: Insufficient documentation

## 2021-12-11 ENCOUNTER — Telehealth: Payer: Self-pay

## 2021-12-11 ENCOUNTER — Telehealth: Payer: Self-pay | Admitting: Cardiology

## 2021-12-11 NOTE — Telephone Encounter (Signed)
Results reviewed with pt as per Dr. Krasowski's note.  Pt verbalized understanding and had no additional questions. Routed to PCP  

## 2021-12-11 NOTE — Telephone Encounter (Signed)
Follow Up:    Patient is returning a call from yesterday, concerning her test results.

## 2021-12-17 ENCOUNTER — Telehealth: Payer: Self-pay | Admitting: Cardiology

## 2021-12-17 ENCOUNTER — Other Ambulatory Visit: Payer: Self-pay

## 2021-12-17 MED ORDER — METOPROLOL TARTRATE 25 MG PO TABS: 25.0000 mg | ORAL_TABLET | Freq: Two times a day (BID) | ORAL | 3 refills | Status: DC

## 2021-12-17 NOTE — Telephone Encounter (Signed)
Patient was informed of the result of her heart monitor and her medication was sent to her pharmacy via [place]. Patient had no further questions at this time.

## 2021-12-17 NOTE — Telephone Encounter (Signed)
Patient returned call for her monitor results.  

## 2021-12-22 ENCOUNTER — Telehealth: Payer: Self-pay | Admitting: Cardiology

## 2021-12-22 NOTE — Telephone Encounter (Signed)
   Pt c/o swelling: STAT is pt has developed SOB within 24 hours  If swelling, where is the swelling located? Legs are swollen  How much weight have you gained and in what time span? No hasn't gained weight  Have you gained 3 pounds in a day or 5 pounds in a week? no  Do you have a log of your daily weights (if so, list)? no  Are you currently taking a fluid pill? Doesn't know  Are you currently SOB? no  Have you traveled recently? No  Patient is calling about a new heart medication she was put on she doesn't know the name of it. She states she runs out of energy. Her legs are more swollen than usual.

## 2021-12-23 NOTE — Telephone Encounter (Signed)
Spoke with pt and advised per Dr. Wendy Poet note to stop Metoprolol to see if this helps her leg swelling and fatigue symptoms. Pt agreed and verbalized understanding.

## 2022-01-05 ENCOUNTER — Ambulatory Visit: Payer: 59

## 2022-01-06 ENCOUNTER — Ambulatory Visit (INDEPENDENT_AMBULATORY_CARE_PROVIDER_SITE_OTHER): Payer: 59 | Admitting: *Deleted

## 2022-01-06 DIAGNOSIS — J4550 Severe persistent asthma, uncomplicated: Secondary | ICD-10-CM | POA: Diagnosis not present

## 2022-02-18 ENCOUNTER — Telehealth: Payer: Self-pay | Admitting: Allergy and Immunology

## 2022-02-18 NOTE — Telephone Encounter (Signed)
Error

## 2022-03-03 ENCOUNTER — Ambulatory Visit: Payer: 59

## 2022-03-08 ENCOUNTER — Ambulatory Visit (INDEPENDENT_AMBULATORY_CARE_PROVIDER_SITE_OTHER): Payer: BC Managed Care – PPO | Admitting: *Deleted

## 2022-03-08 DIAGNOSIS — J4550 Severe persistent asthma, uncomplicated: Secondary | ICD-10-CM

## 2022-04-26 ENCOUNTER — Other Ambulatory Visit: Payer: Self-pay | Admitting: *Deleted

## 2022-04-26 MED ORDER — FASENRA 30 MG/ML ~~LOC~~ SOSY: 30.0000 mg | PREFILLED_SYRINGE | SUBCUTANEOUS | 6 refills | Status: DC

## 2022-05-03 ENCOUNTER — Ambulatory Visit: Payer: Self-pay

## 2022-05-10 ENCOUNTER — Ambulatory Visit (INDEPENDENT_AMBULATORY_CARE_PROVIDER_SITE_OTHER): Payer: BC Managed Care – PPO | Admitting: *Deleted

## 2022-05-10 DIAGNOSIS — J4550 Severe persistent asthma, uncomplicated: Secondary | ICD-10-CM

## 2022-05-15 ENCOUNTER — Other Ambulatory Visit: Payer: Self-pay | Admitting: Allergy and Immunology

## 2022-05-17 ENCOUNTER — Other Ambulatory Visit: Payer: Self-pay

## 2022-05-17 MED ORDER — ALBUTEROL SULFATE HFA 108 (90 BASE) MCG/ACT IN AERS: INHALATION_SPRAY | RESPIRATORY_TRACT | 0 refills | Status: DC

## 2022-05-19 ENCOUNTER — Ambulatory Visit: Payer: BC Managed Care – PPO | Attending: Cardiology | Admitting: Cardiology

## 2022-05-19 ENCOUNTER — Encounter: Payer: Self-pay | Admitting: Cardiology

## 2022-05-19 VITALS — BP 132/76 | HR 73 | Ht 61.0 in | Wt 301.0 lb

## 2022-05-19 DIAGNOSIS — I2510 Atherosclerotic heart disease of native coronary artery without angina pectoris: Secondary | ICD-10-CM

## 2022-05-19 DIAGNOSIS — G4733 Obstructive sleep apnea (adult) (pediatric): Secondary | ICD-10-CM | POA: Diagnosis not present

## 2022-05-19 DIAGNOSIS — R0609 Other forms of dyspnea: Secondary | ICD-10-CM

## 2022-05-19 DIAGNOSIS — K219 Gastro-esophageal reflux disease without esophagitis: Secondary | ICD-10-CM | POA: Diagnosis not present

## 2022-05-19 NOTE — Patient Instructions (Signed)
Medication Instructions:  Your physician recommends that you continue on your current medications as directed. Please refer to the Current Medication list given to you today.  *If you need a refill on your cardiac medications before your next appointment, please call your pharmacy*   Lab Work: None Ordered If you have labs (blood work) drawn today and your tests are completely normal, you will receive your results only by: [place] (if you have MyChart) OR A paper copy in the mail If you have any lab test that is abnormal or we need to change your treatment, we will call you to review the results.   Testing/Procedures: None Ordered   Follow-Up: At [facility], you and your health needs are our priority.  As part of our continuing mission to provide you with exceptional heart care, we have created designated Provider Care Teams.  These Care Teams include your primary Cardiologist (physician) and Advanced Practice Providers (APPs -  Physician Assistants and Nurse Practitioners) who all work together to provide you with the care you need, when you need it.  We recommend signing up for the patient portal called "MyChart".  Sign up information is provided on this After Visit Summary.  MyChart is used to connect with patients for Virtual Visits (Telemedicine).  Patients are able to view lab/test results, encounter notes, upcoming appointments, etc.  Non-urgent messages can be sent to your provider as well.   To learn more about what you can do with MyChart, go to [email or web address].    Your next appointment:   6 month(s)  The format for your next appointment:   In Person  Provider:   Jenne Campus, MD    Other Instructions NA   This visit was accompanied by Enrigue Catena.

## 2022-05-19 NOTE — Progress Notes (Signed)
Cardiology Office Note:    Date:  05/19/2022   ID:  Beth Rodriguez, DOB 1961-02-12, MRN [id number]  PCP:  [name], MD  Cardiologist:  Jenne Campus, MD    Referring MD: [name], MD   Chief Complaint  Patient presents with   Follow-up    History of Present Illness:    Beth Rodriguez is a 62 y.o. female with past medical history significant for essential hypertension, dyslipidemia, morbid obesity she did have a stress test done in 2018 which shows small area of ischemia she did not want to do cardiac catheterization, she is completely asymptomatic.  Recently she had a calcium score done which is only mildly elevated calcium score was 26.  She comes today to months for follow-up.  She denies have any issues.  There is no chest pain tightness squeezing pressure burning chest.  Past Medical History:  Diagnosis Date   Abnormal stress test 04/18/2017   Acquired hypothyroidism 07/18/2015   Last Assessment & Plan:  Formatting of this note might be different from the original. Relevant Hx: Course: Daily Update: Today's Plan:she is going to have her TSH updated for her   Electronically signed by: Mayer Camel, NP 07/22/15 2115   ADRENAL INSUFFICIENCY, HX OF 01/23/2010   Qualifier: Diagnosis of  By: Inda Castle FNP, Melissa S    Allergic rhinitis 12/12/2014   Aortic atherosclerosis ([place]) 02/18/2017   Formatting of this note might be different from the original. Noted on CT scan ([facility]) 02/2017   Arthritis    Asthma    ASTHMA 01/16/2010   Qualifier: Diagnosis of  By: Inda Castle FNP, Melissa S    Cardiomegaly 01/16/2010   Qualifier: Diagnosis of  By: Inda Castle FNP, Melissa S    Carpal tunnel syndrome on right    Coronary artery disease 07/21/2017   With mildly abnormal stress test showing small defect involving apex indicating distal LAD disease  Formatting of this note might be different from the original. Overview:  With mildly abnormal stress test showing  small defect involving apex indicating distal LAD disease   COUGH, CHRONIC 01/23/2010   Qualifier: Diagnosis of  By: Inda Castle FNP, Melissa S    DEEP VENOUS THROMBOPHLEBITIS, LEG, RIGHT, HX OF 01/16/2010   Qualifier: Diagnosis of  By: Inda Castle FNP, Melissa S    Degeneration of lumbar intervertebral disc 07/18/2015   Last Assessment & Plan:  Formatting of this note might be different from the original. Relevant Hx: Course: Daily Update: Today's Plan:longstanding for her and she is still working and on her feet some days being harder for her than others, she is to again work on her weight to help with this  Electronically signed by: Mayer Camel, NP [id number]   DIASTOLIC DYSFUNCTION [id number]   Annotation: grade 2 per 2-D echo 01/26/10 Qualifier: Diagnosis of  By: Inda Castle FNP, Melissa S    Diverticulosis 10/03/2017   DYSPNEA ON EXERTION 01/16/2010   Qualifier: Diagnosis of  By: Inda Castle FNP, Melissa S    Dyspnea on exertion 03/17/2017   Edema 01/16/2010   Qualifier: Diagnosis of  By: Inda Castle FNP, Melissa S    Embolism - blood clot 2007   right leg hx of   Enlarged heart    Essential hypertension 01/29/2010   Qualifier: Diagnosis of  By: Ronnald Ramp RN, Crystal    Last Assessment & Plan:  Formatting of this note might be different from the original. Relevant Hx: Course: Daily Update: Today's Plan:stable for her  and will follow this for her  Electronically signed by: Mayer Camel, NP 07/22/15 2113   Gastroesophageal reflux disease without esophagitis 07/18/2015   Last Assessment & Plan:  Formatting of this note might be different from the original. Relevant Hx: Course: Daily Update: Today's Plan:this is stable for her at this time and will follow her  Electronically signed by: Mayer Camel, NP 07/22/15 2116   GERD (gastroesophageal reflux disease)    GERD, SEVERE 02/01/2010   Qualifier: Diagnosis of  By: Joya Gaskins MD, Burnett Harry    Hypertension     Hypothyroidism    HYPOTHYROIDISM 01/16/2010   Qualifier: Diagnosis of  By: Inda Castle FNP, Melissa S    LIPOMA 05/15/2010   Qualifier: Diagnosis of  By: Inda Castle FNP, Melissa S    Lumbar spondylosis 02/18/2017   Formatting of this note might be different from the original. Noted on ([facility]) CT scan 02/2017   Mixed hyperlipidemia 07/18/2015   Last Assessment & Plan:  Formatting of this note might be different from the original. Relevant Hx: Course: Daily Update: Today's Plan:she is going to have her lipids updated fasting  Electronically signed by: Mayer Camel, NP 07/22/15 2112   OBESITY, MORBID 01/16/2010   Qualifier: Diagnosis of  By: Inda Castle FNP, [place]   Formatting of this note might be different from the original. Overview:  Qualifier: Diagnosis of  By: Inda Castle FNP, Melissa S   Obstructive sleep apnea syndrome 01/16/2010   last sleep study over five years Formatting of this note might be different from the original. Overview:  Qualifier: Diagnosis of  By: Inda Castle FNP, [place]:  Formatting of this note might be different from the original. Relevant Hx: Course: Daily Update: Today's Plan:she cannot wear the mask for this and have discussed with her how this can affect her by not doing so     Osteopenia of multiple sites 06/02/2017   Formatting of this note might be different from the original. -2.3   Other acute sinusitis 01/29/2010   Qualifier: Diagnosis of  By: Joya Gaskins MD, Burnett Harry    Prediabetes 07/22/2015   Last Assessment & Plan:  Formatting of this note might be different from the original. Relevant Hx: Course: Daily Update: Today's Plan:would update her HGBA1C for her and with her use of the steroids this can be elevated for her  Electronically signed by: Mayer Camel, NP 07/22/15 2109   Right rotator cuff tear 08/09/2014   Severe persistent asthma 01/09/2015   Beth Rodriguez presents today with a one week history of wheezing and  DOE and slight cough requiring her to use a SABA a few times per day. In addition, has nasal congestion and green rhinorrhea. She has been off her prednisone for over two months and was doing very well until this flare up. She continue on her Nucala and her large collection of medication for inflammation and reflux. Her reflux is under c   Shortness of breath dyspnea    with exertion   Sleep apnea    last sleep study over five years   SLEEP APNEA, OBSTRUCTIVE 01/16/2010   Qualifier: Diagnosis of  By: Inda Castle FNP, Melissa S    Thyroid disease    hypothyroidism   Vitamin D deficiency 02/02/2010   Qualifier: Diagnosis of  By: Inda Castle FNP, [place]   Formatting of this note might be different from the original. Overview:  Qualifier: Diagnosis of  By: Inda Castle FNP, [place]  Past Surgical History:  Procedure Laterality Date   CARPAL TUNNEL RELEASE Right    EYE SURGERY Bilateral    cataract removal    KNEE SURGERY  2007   right knee   LIPOMA EXCISION Right 03/28/2019   Procedure: EXCISION SUBCUTANEOUS LIPOMA RIGHT SHOULDER;  Surgeon: Donnie Mesa, MD;  Location: [facility];  Service: General;  Laterality: Right;   SHOULDER ARTHROSCOPY WITH OPEN ROTATOR CUFF REPAIR AND DISTAL CLAVICLE ACROMINECTOMY Right 08/09/2014   Procedure: SHOULDER ARTHROSCOPY WITH OPEN ROTATOR CUFF REPAIR AND DISTAL CLAVICLE [place];  Surgeon: Earlie Server, MD;  Location: [place];  Service: Orthopedics;  Laterality: Right;   SINUS SURGERY WITH INSTATRAK     ???    Current Medications: Current Meds  Medication Sig   albuterol (PROVENTIL) (2.5 MG/3ML) 0.083% nebulizer solution Take 2.5 mg by nebulization every 6 (six) hours as needed for wheezing or shortness of breath.   albuterol (VENTOLIN HFA) 108 (90 Base) MCG/ACT inhaler Can inhale two puffs every four to six hours as needed for cough or wheeze. (Patient taking differently: Inhale 2 puffs into the lungs every 6 (six) hours as needed for wheezing  (cough). Can inhale two puffs every four to six hours as needed for cough or wheeze.)   aspirin EC 81 MG tablet Take 81 mg by mouth daily.    atorvastatin (LIPITOR) 10 MG tablet Take 1 tablet (10 mg total) by mouth daily.   AUVI-Q 0.3 MG/0.3ML SOAJ injection Use as directed for life-threatening allergic reaction. (Patient taking differently: Inject 0.3 mg into the muscle as needed for anaphylaxis. Use as directed for life-threatening allergic reaction.)   Benralizumab (FASENRA) 30 MG/ML SOSY Inject 1 mL (30 mg total) into the skin every 8 (eight) weeks.   Budeson-Glycopyrrol-Formoterol (BREZTRI AEROSPHERE) 160-9-4.8 MCG/ACT AERO Inhale 2 puffs into the lungs 2 (two) times daily.   hydrochlorothiazide (MICROZIDE) 12.5 MG capsule TAKE 1 CAPSULE BY MOUTH EVERY DAY (Patient taking differently: Take 12.5 mg by mouth daily.)   ibandronate (BONIVA) 150 MG tablet Take 1 tablet by mouth every 28 (twenty-eight) days.   levothyroxine (SYNTHROID, LEVOTHROID) 88 MCG tablet Take 88 mcg by mouth daily before breakfast.    loratadine (CLARITIN) 10 MG tablet Take 10 mg by mouth daily as needed for allergies. Reported on 07/21/2015   losartan (COZAAR) 100 MG tablet Take 100 mg by mouth daily.   mometasone (NASONEX) 50 MCG/ACT nasal spray Use one spray in each nostril one to two times per days. (Patient taking differently: Place 2 sprays into the nose daily. Use one spray in each nostril one to two times per days.)   pantoprazole (PROTONIX) 40 MG tablet Take 40 mg by mouth 2 (two) times daily.   Propylene Glycol (SYSTANE COMPLETE) 0.6 % SOLN Place 1 drop into both eyes daily as needed (dry eyes).   tiZANidine (ZANAFLEX) 4 MG tablet Take 4 mg by mouth every 6 (six) hours as needed for muscle spasms.   [DISCONTINUED] levalbuterol (XOPENEX HFA) 45 MCG/ACT inhaler INHALE 2 PUFFS EVERY 4 HOURS AS NEEDED FOR COUGH OR WHEEZE (Patient taking differently: Inhale 2 puffs into the lungs every 6 (six) hours as needed for wheezing or  shortness of breath. INHALE 2 PUFFS EVERY 4 HOURS AS NEEDED FOR COUGH OR WHEEZE)   [DISCONTINUED] metoprolol tartrate (LOPRESSOR) 25 MG tablet Take 1 tablet (25 mg total) by mouth 2 (two) times daily.   Current Facility-Administered Medications for the 05/19/22 encounter (Office Visit) with Park Liter, MD  Medication  Benralizumab SOSY 30 mg     Allergies:   Penicillins, Theophyllines, and Theophylline   Social History   Socioeconomic History   Marital status: Married    Spouse name: Not on file   Number of children: 1   Years of education: Not on file   Highest education level: Not on file  Occupational History   Occupation: PRODUCTION ASSEMBLY    Employer: [employer]  Tobacco Use   Smoking status: Never   Smokeless tobacco: Never  Vaping Use   Vaping Use: Never used  Substance and Sexual Activity   Alcohol use: No   Drug use: No   Sexual activity: Not on file  Other Topics Concern   Not on file  Social History Narrative   Not on file   Social Determinants of Health   Financial Resource Strain: Not on file  Food Insecurity: Not on file  Transportation Needs: Not on file  Physical Activity: Not on file  Stress: Not on file  Social Connections: Not on file     Family History: The patient's family history includes Cancer in her sister; Carpal tunnel syndrome in her daughter and sister; Diabetes in her sister; Hyperlipidemia in her sister; Hypertension in her brother, daughter, mother, and sister. ROS:   Please see the history of present illness.    All 14 point review of systems negative except as described per history of present illness  EKGs/Labs/Other Studies Reviewed:      Recent Labs: No results found for requested labs within last 365 days.  Recent Lipid Panel    Component Value Date/Time   CHOL 137 07/21/2017 1015   TRIG 44 07/21/2017 1015   HDL 62 07/21/2017 1015   CHOLHDL 2.2 07/21/2017 1015   LDLCALC 66 07/21/2017 1015     Physical Exam:    VS:  BP 132/76 (BP Location: Left Arm, Patient Position: Sitting)   Pulse 73   Ht 5' 1"$  (1.549 m)   Wt (!) 301 lb (136.5 kg)   SpO2 98%   BMI 56.87 kg/m     Wt Readings from Last 3 Encounters:  05/19/22 (!) 301 lb (136.5 kg)  11/27/21 286 lb (129.7 kg)  10/07/21 290 lb (131.5 kg)     GEN:  Well nourished, well developed in no acute distress HEENT: Normal NECK: No JVD; No carotid bruits LYMPHATICS: No lymphadenopathy CARDIAC: RRR, no murmurs, no rubs, no gallops RESPIRATORY:  Clear to auscultation without rales, wheezing or rhonchi  ABDOMEN: Soft, non-tender, non-distended MUSCULOSKELETAL:  No edema; No deformity  SKIN: Warm and dry LOWER EXTREMITIES: no swelling NEUROLOGIC:  Alert and oriented x 3 PSYCHIATRIC:  Normal affect   ASSESSMENT:    1. Coronary artery disease involving native coronary artery of native heart without angina pectoris   2. SLEEP APNEA, OBSTRUCTIVE   3. Gastroesophageal reflux disease without esophagitis   4. Dyspnea on exertion    PLAN:    In order of problems listed above:  Coronary disease seems to be stable from that point review we will continue present management. Dyslipidemia I did review her K PN which show me LDL 66 HDL 662.  Continue Lipitor as well as aspirin. Gastroesophageal reflux disease.  Stable. Dyspnea on exertion denies having any.  I was chaperoned by Wilburn Mylar during the    Medication Adjustments/Labs and Tests Ordered: Current medicines are reviewed at length with the patient today.  Concerns regarding medicines are outlined above.  No orders of the defined types were  placed in this encounter.  Medication changes: No orders of the defined types were placed in this encounter.   Signed, Park Liter, MD, [facility] 05/19/2022 4:37 PM    [place]

## 2022-05-31 ENCOUNTER — Ambulatory Visit: Payer: 59 | Admitting: Cardiology

## 2022-06-17 ENCOUNTER — Ambulatory Visit: Payer: BC Managed Care – PPO | Admitting: Allergy and Immunology

## 2022-06-17 ENCOUNTER — Encounter: Payer: Self-pay | Admitting: Allergy and Immunology

## 2022-06-17 VITALS — BP 132/62 | HR 76 | Resp 20 | Ht 60.0 in | Wt 297.8 lb

## 2022-06-17 DIAGNOSIS — H8113 Benign paroxysmal vertigo, bilateral: Secondary | ICD-10-CM

## 2022-06-17 DIAGNOSIS — J3089 Other allergic rhinitis: Secondary | ICD-10-CM

## 2022-06-17 DIAGNOSIS — J4550 Severe persistent asthma, uncomplicated: Secondary | ICD-10-CM | POA: Diagnosis not present

## 2022-06-17 DIAGNOSIS — K219 Gastro-esophageal reflux disease without esophagitis: Secondary | ICD-10-CM | POA: Diagnosis not present

## 2022-06-17 MED ORDER — AIRSUPRA 90-80 MCG/ACT IN AERO: 2.0000 | INHALATION_SPRAY | RESPIRATORY_TRACT | 1 refills | Status: DC | PRN

## 2022-06-17 NOTE — Patient Instructions (Addendum)
  1. Continue Breztri - 2 inhalations 2 times per day    2. Continue Benralizumab injections  3. Continue nasonex 1 spray each nostril 1-2 times per day.   4. Continue pantoprazole '40mg'$  1-2 times per day  5. Continue AirSupra (Coupon) or Albuterol nebulization if needed.  6. Can add OTC antihistamine - Zyrtec / Allegra / Claritin, nasal saline if needed  7. For this vertigo issue:   A. Use the prescribed meclizine  B. Prednisone 10 mg - 1 tablet 1 time per day for 10 days C. Use horizontal visual cues  8. Return to clinic in 6 months or earlier if problem

## 2022-06-17 NOTE — Progress Notes (Signed)
[facility] [facility] [place] [facility] [place]   Follow-up Note  Referring Provider: [name], MD Primary Provider: [name], MD Date of Office Visit: 06/17/2022  Subjective:   Beth Rodriguez (DOB: 23-Sep-1960) is a 62 y.o. female who returns to the [facility] on 06/17/2022 in re-evaluation of the following:  HPI: Edis returns to this clinic in evaluation of asthma, allergic rhinitis, chronic sinusitis, LPR, and a recent issue with vertigo.  I last saw her in this clinic 28 September 2021.  Concerning her airway, she appears to be doing pretty well.  She still has a pretty high requirement for short acting bronchodilator at least several times per week but overall is very stable on her current plan of anti-inflammatory medications for her airway and an anti-IL-5 biologic agent.  She definitely must use a short acting bronchodilator prior to exercise.  She had very little problems with her nose.  She had very little problems with reflux.  She has been having an issue with dizziness that has been developing over the course of the past week without any tinnitus or hearing loss and without an obvious precipitant for which she saw her primary care doctor earlier this week and was started on meclizine.  This dizziness appears to be have a positional quality.  She is exercising as a program in preparation for working at [employer] and there are a lot of floor exercises that are being done and it seems as though that is is a problem whenever she goes from the recumbent position to the standing position and sometimes from the standing position to the recumbent position regarding this issue.  She contracted COVID during this Christmas holiday and was treated with an antiviral agent and fortunately she had no long-term sequela as a result of that issue.  Allergies as of 06/17/2022       Reactions   Penicillins Hives, Other (See Comments)   Did it involve  swelling of the face/tongue/throat, SOB, or low BP? No Did it involve sudden or severe rash/hives, skin peeling, or any reaction on the inside of your mouth or nose?    #  #  YES  #  #  Did you need to seek medical attention at a hospital or doctor's office?    #  #  YES  #  #  When did it last happen?     2019    Theophyllines Other (See Comments)   HEADACHE Severe headaches   Theophylline Other (See Comments)   REACTION: headaches        Medication List    albuterol (2.5 MG/3ML) 0.083% nebulizer solution Commonly known as: PROVENTIL Take 2.5 mg by nebulization every 6 (six) hours as needed for wheezing or shortness of breath.   albuterol 108 (90 Base) MCG/ACT inhaler Commonly known as: VENTOLIN HFA Can inhale two puffs every four to six hours as needed for cough or wheeze.   aspirin EC 81 MG tablet Take 81 mg by mouth daily.   atorvastatin 10 MG tablet Commonly known as: LIPITOR Take 1 tablet (10 mg total) by mouth daily.   Auvi-Q 0.3 mg/0.3 mL Soaj injection Generic drug: EPINEPHrine Use as directed for life-threatening allergic reaction.   Breztri Aerosphere 160-9-4.8 MCG/ACT Aero Generic drug: Budeson-Glycopyrrol-Formoterol Inhale 2 puffs into the lungs 2 (two) times daily.   Fasenra 30 MG/ML Sosy Generic drug: Benralizumab Inject 1 mL (30 mg total) into the skin every  8 (eight) weeks.   hydrochlorothiazide 12.5 MG capsule Commonly known as: MICROZIDE TAKE 1 CAPSULE BY MOUTH EVERY DAY What changed: how much to take   ibandronate 150 MG tablet Commonly known as: BONIVA Take 1 tablet by mouth every 28 (twenty-eight) days.   levothyroxine 88 MCG tablet Commonly known as: SYNTHROID Take 88 mcg by mouth daily before breakfast.   loratadine 10 MG tablet Commonly known as: CLARITIN Take 10 mg by mouth daily as needed for allergies. Reported on 07/21/2015   losartan 100 MG tablet Commonly known as: COZAAR Take 100 mg by mouth daily.   meclizine 25 MG  tablet Commonly known as: ANTIVERT Take by mouth.   methocarbamol 500 MG tablet Commonly known as: ROBAXIN Take by mouth.   mometasone 50 MCG/ACT nasal spray Commonly known as: Nasonex Use one spray in each nostril one to two times per days.   pantoprazole 40 MG tablet Commonly known as: PROTONIX Take 40 mg by mouth 2 (two) times daily.   Systane Complete 0.6 % Soln Generic drug: Propylene Glycol Place 1 drop into both eyes daily as needed (dry eyes).   tiZANidine 4 MG tablet Commonly known as: ZANAFLEX Take 4 mg by mouth every 6 (six) hours as needed for muscle spasms.    Past Medical History:  Diagnosis Date   Abnormal stress test 04/18/2017   Acquired hypothyroidism 07/18/2015   Last Assessment & Plan:  Formatting of this note might be different from the original. Relevant Hx: Course: Daily Update: Today's Plan:she is going to have her TSH updated for her   Electronically signed by: Mayer Camel, NP 07/22/15 2115   ADRENAL INSUFFICIENCY, HX OF 01/23/2010   Qualifier: Diagnosis of  By: Inda Castle FNP, Melissa S    Allergic rhinitis 12/12/2014   Aortic atherosclerosis ([place]) 02/18/2017   Formatting of this note might be different from the original. Noted on CT scan ([facility]) 02/2017   Arthritis    Asthma    ASTHMA 01/16/2010   Qualifier: Diagnosis of  By: Inda Castle FNP, Melissa S    Cardiomegaly 01/16/2010   Qualifier: Diagnosis of  By: Inda Castle FNP, Melissa S    Carpal tunnel syndrome on right    Coronary artery disease 07/21/2017   With mildly abnormal stress test showing small defect involving apex indicating distal LAD disease  Formatting of this note might be different from the original. Overview:  With mildly abnormal stress test showing small defect involving apex indicating distal LAD disease   COUGH, CHRONIC 01/23/2010   Qualifier: Diagnosis of  By: Inda Castle FNP, Melissa S    DEEP VENOUS THROMBOPHLEBITIS, LEG, RIGHT, HX OF 01/16/2010    Qualifier: Diagnosis of  By: Inda Castle FNP, Melissa S    Degeneration of lumbar intervertebral disc 07/18/2015   Last Assessment & Plan:  Formatting of this note might be different from the original. Relevant Hx: Course: Daily Update: Today's Plan:longstanding for her and she is still working and on her feet some days being harder for her than others, she is to again work on her weight to help with this  Electronically signed by: Mayer Camel, NP [id number]   DIASTOLIC DYSFUNCTION [id number]   Annotation: grade 2 per 2-D echo 01/26/10 Qualifier: Diagnosis of  By: Inda Castle FNP, Melissa S    Diverticulosis 10/03/2017   DYSPNEA ON EXERTION 01/16/2010   Qualifier: Diagnosis of  By: Inda Castle FNP, Melissa S    Dyspnea on exertion 03/17/2017   Edema 01/16/2010   Qualifier: Diagnosis  of  By: Inda Castle FNP, Melissa S    Embolism - blood clot 2007   right leg hx of   Enlarged heart    Essential hypertension 01/29/2010   Qualifier: Diagnosis of  By: Ronnald Ramp RN, [place]    Last Assessment & Plan:  Formatting of this note might be different from the original. Relevant Hx: Course: Daily Update: Today's Plan:stable for her and will follow this for her  Electronically signed by: Mayer Camel, NP 07/22/15 2113   Gastroesophageal reflux disease without esophagitis 07/18/2015   Last Assessment & Plan:  Formatting of this note might be different from the original. Relevant Hx: Course: Daily Update: Today's Plan:this is stable for her at this time and will follow her  Electronically signed by: Mayer Camel, NP 07/22/15 2116   GERD (gastroesophageal reflux disease)    GERD, SEVERE 02/01/2010   Qualifier: Diagnosis of  By: Joya Gaskins MD, Burnett Harry    Hypertension    Hypothyroidism    HYPOTHYROIDISM 01/16/2010   Qualifier: Diagnosis of  By: Inda Castle FNP, Melissa S    LIPOMA 05/15/2010   Qualifier: Diagnosis of  By: Inda Castle FNP, Melissa S    Lumbar spondylosis 02/18/2017    Formatting of this note might be different from the original. Noted on ([facility]) CT scan 02/2017   Mixed hyperlipidemia 07/18/2015   Last Assessment & Plan:  Formatting of this note might be different from the original. Relevant Hx: Course: Daily Update: Today's Plan:she is going to have her lipids updated fasting  Electronically signed by: Mayer Camel, NP 07/22/15 2112   OBESITY, MORBID 01/16/2010   Qualifier: Diagnosis of  By: Inda Castle FNP, [place]   Formatting of this note might be different from the original. Overview:  Qualifier: Diagnosis of  By: Inda Castle FNP, Melissa S   Obstructive sleep apnea syndrome 01/16/2010   last sleep study over five years Formatting of this note might be different from the original. Overview:  Qualifier: Diagnosis of  By: Inda Castle FNP, [place]:  Formatting of this note might be different from the original. Relevant Hx: Course: Daily Update: Today's Plan:she cannot wear the mask for this and have discussed with her how this can affect her by not doing so     Osteopenia of multiple sites 06/02/2017   Formatting of this note might be different from the original. -2.3   Other acute sinusitis 01/29/2010   Qualifier: Diagnosis of  By: Joya Gaskins MD, Burnett Harry    Prediabetes 07/22/2015   Last Assessment & Plan:  Formatting of this note might be different from the original. Relevant Hx: Course: Daily Update: Today's Plan:would update her HGBA1C for her and with her use of the steroids this can be elevated for her  Electronically signed by: Mayer Camel, NP 07/22/15 2109   Right rotator cuff tear 08/09/2014   Severe persistent asthma 01/09/2015   Unique presents today with a one week history of wheezing and DOE and slight cough requiring her to use a SABA a few times per day. In addition, has nasal congestion and green rhinorrhea. She has been off her prednisone for over two months and was doing very well until this  flare up. She continue on her Nucala and her large collection of medication for inflammation and reflux. Her reflux is under c   Shortness of breath dyspnea    with exertion   Sleep apnea    last sleep study over five years  SLEEP APNEA, OBSTRUCTIVE 01/16/2010   Qualifier: Diagnosis of  By: Inda Castle FNP, Melissa S    Thyroid disease    hypothyroidism   Vitamin D deficiency 02/02/2010   Qualifier: Diagnosis of  By: Inda Castle FNP, Melissa S   Formatting of this note might be different from the original. Overview:  Qualifier: Diagnosis of  By: Inda Castle FNP, [place]    Past Surgical History:  Procedure Laterality Date   CARPAL TUNNEL RELEASE Right    EYE SURGERY Bilateral    cataract removal    KNEE SURGERY  2007   right knee   LIPOMA EXCISION Right 03/28/2019   Procedure: EXCISION SUBCUTANEOUS LIPOMA RIGHT SHOULDER;  Surgeon: Donnie Mesa, MD;  Location: [facility];  Service: General;  Laterality: Right;   SHOULDER ARTHROSCOPY WITH OPEN ROTATOR CUFF REPAIR AND DISTAL CLAVICLE ACROMINECTOMY Right 08/09/2014   Procedure: SHOULDER ARTHROSCOPY WITH OPEN ROTATOR CUFF REPAIR AND DISTAL CLAVICLE [place];  Surgeon: Earlie Server, MD;  Location: [facility];  Service: Orthopedics;  Laterality: Right;   SINUS SURGERY WITH INSTATRAK     ???    Review of systems negative except as noted in HPI / PMHx or noted below:  Review of Systems  Constitutional: Negative.   HENT: Negative.    Eyes: Negative.   Respiratory: Negative.    Cardiovascular: Negative.   Gastrointestinal: Negative.   Genitourinary: Negative.   Musculoskeletal: Negative.   Skin: Negative.   Neurological: Negative.   Endo/Heme/Allergies: Negative.   Psychiatric/Behavioral: Negative.       Objective:   Vitals:   06/17/22 1118 06/17/22 1201  BP: (!) 132/92 132/62  Pulse: 76   Resp: 20   SpO2: 94%    Height: 5' (152.4 cm)  Weight: 297 lb 12.8 oz (135.1 kg)   Physical Exam Constitutional:      Appearance: She  is not diaphoretic.  HENT:     Head: Normocephalic.     Right Ear: Tympanic membrane, ear canal and external ear normal.     Left Ear: Tympanic membrane, ear canal and external ear normal.     Nose: Nose normal. No mucosal edema or rhinorrhea.     Mouth/Throat:     Pharynx: Uvula midline. No oropharyngeal exudate.  Eyes:     Conjunctiva/sclera: Conjunctivae normal.  Neck:     Thyroid: No thyromegaly.     Trachea: Trachea normal. No tracheal tenderness or tracheal deviation.  Cardiovascular:     Rate and Rhythm: Normal rate and regular rhythm.     Heart sounds: Normal heart sounds, S1 normal and S2 normal. No murmur heard. Pulmonary:     Effort: No respiratory distress.     Breath sounds: No stridor. Wheezing (Scattered expiratory wheezes posterior lung field) present. No rales.  Lymphadenopathy:     Head:     Right side of head: No tonsillar adenopathy.     Left side of head: No tonsillar adenopathy.     Cervical: No cervical adenopathy.  Skin:    Findings: No erythema or rash.     Nails: There is no clubbing.  Neurological:     Mental Status: She is alert.     Diagnostics:    Spirometry was performed and demonstrated an FEV1 of 1.41 at 66 % of predicted.  Assessment and Plan:   1. Asthma, severe persistent, well-controlled   2. Other allergic rhinitis   3. LPRD (laryngopharyngeal reflux disease)   4. Benign paroxysmal positional vertigo due to bilateral vestibular disorder  1. Continue Breztri - 2 inhalations 2 times per day    2. Continue Benralizumab injections  3. Continue nasonex 1 spray each nostril 1-2 times per day.   4. Continue pantoprazole '40mg'$  1-2 times per day  5. Continue AirSupra (Coupon) or Albuterol nebulization if needed.  6. Can add OTC antihistamine - Zyrtec / Allegra / Claritin, nasal saline if needed  7. For this vertigo issue:   A. Use the prescribed meclizine  B. Prednisone 10 mg - 1 tablet 1 time per day for 10 days C. Use  horizontal visual cues  8. Return to clinic in 6 months or earlier if problem  [place] is doing okay regarding her airway and her reflux issue.  She has developed acute positional vertigo and had a talk with her today about using visual cues to help override her inner ear dysfunction and she can use a very short course of prednisone to address any inflammation that may be present giving rise to this issue.  Assuming she does well I will see her back in this clinic in 6 months while she continues to use a collection of anti-inflammatory agents for airway including an anti-IL-5 biologic agent and continues to treat her reflux.  Allena Katz, MD All[facility]ology [place]

## 2022-06-21 ENCOUNTER — Encounter: Payer: Self-pay | Admitting: Allergy and Immunology

## 2022-06-28 ENCOUNTER — Other Ambulatory Visit: Payer: Self-pay | Admitting: Allergy and Immunology

## 2022-06-28 ENCOUNTER — Ambulatory Visit: Payer: BC Managed Care – PPO | Admitting: Allergy and Immunology

## 2022-06-28 ENCOUNTER — Encounter: Payer: Self-pay | Admitting: Allergy and Immunology

## 2022-06-28 VITALS — BP 118/64 | HR 68 | Resp 16

## 2022-06-28 DIAGNOSIS — K219 Gastro-esophageal reflux disease without esophagitis: Secondary | ICD-10-CM

## 2022-06-28 DIAGNOSIS — J3089 Other allergic rhinitis: Secondary | ICD-10-CM

## 2022-06-28 DIAGNOSIS — J4550 Severe persistent asthma, uncomplicated: Secondary | ICD-10-CM

## 2022-06-28 MED ORDER — MOMETASONE FUROATE 50 MCG/ACT NA SUSP: NASAL | 5 refills | Status: DC

## 2022-06-28 MED ORDER — METHYLPREDNISOLONE ACETATE 80 MG/ML IJ SUSP
80.0000 mg | Freq: Once | INTRAMUSCULAR | Status: AC
  Administered 2022-06-28: 80 mg via INTRAMUSCULAR

## 2022-06-28 NOTE — Progress Notes (Unsigned)
[facility] [place] [facility] [place]   Follow-up Note  Referring Provider: [name], MD Primary Provider: [name], MD Date of Office Visit: 06/28/2022  Subjective:   Beth Rodriguez (DOB: Jul 30, 1960) is a 62 y.o. female who returns to the [facility] on 06/28/2022 in re-evaluation of the following:  HPI: Asie returns to this clinic in evaluation of asthma, allergic rhinitis, chronic sinusitis, LPR, and recent issue with vertigo.  I last saw her in this clinic 17 June 2022.  Since have seen her in this clinic last she has had some continued problems with getting short of breath especially if she exerts herself.  She has not had any chest pain and she has not had any leg swelling and this feels like her usual asthma.  She must use a short acting bronchodilator every morning.  She has had no problems with her upper airways.  She had no problems with reflux.  She did have an issue with vertigo but that she slowly adapting to that issue.  Allergies as of 06/28/2022       Reactions   Penicillins Hives, Other (See Comments)   Did it involve swelling of the face/tongue/throat, SOB, or low BP? No Did it involve sudden or severe rash/hives, skin peeling, or any reaction on the inside of your mouth or nose?    #  #  YES  #  #  Did you need to seek medical attention at a hospital or doctor's office?    #  #  YES  #  #  When did it last happen?     2019    Theophyllines Other (See Comments)   HEADACHE Severe headaches   Theophylline Other (See Comments)   REACTION: headaches        Medication List    Airsupra 90-80 MCG/ACT Aero Generic drug: Albuterol-Budesonide Inhale 2 puffs into the lungs as needed (every four to six hours for cough or wheeze.  Rinse, gargle, and spit after use).   albuterol (2.5 MG/3ML) 0.083% nebulizer solution Commonly known as: PROVENTIL Take 2.5 mg by nebulization every 6 (six) hours as needed for wheezing  or shortness of breath.   albuterol 108 (90 Base) MCG/ACT inhaler Commonly known as: VENTOLIN HFA Can inhale two puffs every four to six hours as needed for cough or wheeze.   aspirin EC 81 MG tablet Take 81 mg by mouth daily.   atorvastatin 10 MG tablet Commonly known as: LIPITOR Take 1 tablet (10 mg total) by mouth daily.   Breztri Aerosphere 160-9-4.8 MCG/ACT Aero Generic drug: Budeson-Glycopyrrol-Formoterol Inhale 2 puffs into the lungs 2 (two) times daily.   Fasenra 30 MG/ML Sosy Generic drug: Benralizumab Inject 1 mL (30 mg total) into the skin every 8 (eight) weeks.   hydrochlorothiazide 12.5 MG capsule Commonly known as: MICROZIDE TAKE 1 CAPSULE BY MOUTH EVERY DAY What changed: how much to take   ibandronate 150 MG tablet Commonly known as: BONIVA Take 1 tablet by mouth every 28 (twenty-eight) days.   levothyroxine 88 MCG tablet Commonly known as: SYNTHROID Take 88 mcg by mouth daily before breakfast.   loratadine 10 MG tablet Commonly known as: CLARITIN Take 10 mg by mouth daily as needed for allergies. Reported on 07/21/2015   losartan 100 MG tablet Commonly known as: COZAAR Take 100 mg by mouth daily.   methocarbamol 500 MG tablet Commonly known as: ROBAXIN Take by mouth.   mometasone 50  MCG/ACT nasal spray Commonly known as: Nasonex Use one spray in each nostril one to two times per days.   pantoprazole 40 MG tablet Commonly known as: PROTONIX Take 40 mg by mouth 2 (two) times daily.   Systane Complete 0.6 % Soln Generic drug: Propylene Glycol Place 1 drop into both eyes daily as needed (dry eyes).   tiZANidine 4 MG tablet Commonly known as: ZANAFLEX Take 4 mg by mouth every 6 (six) hours as needed for muscle spasms.    Past Medical History:  Diagnosis Date   Abnormal stress test 04/18/2017   Acquired hypothyroidism 07/18/2015   Last Assessment & Plan:  Formatting of this note might be different from the original. Relevant Hx: Course: Daily  Update: Today's Plan:she is going to have her TSH updated for her   Electronically signed by: Mayer Camel, NP 07/22/15 2115   ADRENAL INSUFFICIENCY, HX OF 01/23/2010   Qualifier: Diagnosis of  By: Inda Castle FNP, Melissa S    Allergic rhinitis 12/12/2014   Aortic atherosclerosis ([place]) 02/18/2017   Formatting of this note might be different from the original. Noted on CT scan ([facility]) 02/2017   Arthritis    Asthma    ASTHMA 01/16/2010   Qualifier: Diagnosis of  By: Inda Castle FNP, Melissa S    Cardiomegaly 01/16/2010   Qualifier: Diagnosis of  By: Inda Castle FNP, Melissa S    Carpal tunnel syndrome on right    Coronary artery disease 07/21/2017   With mildly abnormal stress test showing small defect involving apex indicating distal LAD disease  Formatting of this note might be different from the original. Overview:  With mildly abnormal stress test showing small defect involving apex indicating distal LAD disease   COUGH, CHRONIC 01/23/2010   Qualifier: Diagnosis of  By: Inda Castle FNP, Melissa S    DEEP VENOUS THROMBOPHLEBITIS, LEG, RIGHT, HX OF 01/16/2010   Qualifier: Diagnosis of  By: Inda Castle FNP, Melissa S    Degeneration of lumbar intervertebral disc 07/18/2015   Last Assessment & Plan:  Formatting of this note might be different from the original. Relevant Hx: Course: Daily Update: Today's Plan:longstanding for her and she is still working and on her feet some days being harder for her than others, she is to again work on her weight to help with this  Electronically signed by: Mayer Camel, NP [id number]   DIASTOLIC DYSFUNCTION [id number]   Annotation: grade 2 per 2-D echo 01/26/10 Qualifier: Diagnosis of  By: Inda Castle FNP, Melissa S    Diverticulosis 10/03/2017   DYSPNEA ON EXERTION 01/16/2010   Qualifier: Diagnosis of  By: Inda Castle FNP, Melissa S    Dyspnea on exertion 03/17/2017   Edema 01/16/2010   Qualifier: Diagnosis of  By: Inda Castle FNP,  Melissa S    Embolism - blood clot 2007   right leg hx of   Enlarged heart    Essential hypertension 01/29/2010   Qualifier: Diagnosis of  By: Ronnald Ramp RN, Crystal    Last Assessment & Plan:  Formatting of this note might be different from the original. Relevant Hx: Course: Daily Update: Today's Plan:stable for her and will follow this for her  Electronically signed by: Mayer Camel, NP 07/22/15 2113   Gastroesophageal reflux disease without esophagitis 07/18/2015   Last Assessment & Plan:  Formatting of this note might be different from the original. Relevant Hx: Course: Daily Update: Today's Plan:this is stable for her at this time and will follow her  Electronically signed by: Lenna Sciara  Leeroy Cha, NP 07/22/15 2116   GERD (gastroesophageal reflux disease)    GERD, SEVERE 02/01/2010   Qualifier: Diagnosis of  By: Joya Gaskins MD, Burnett Harry    Hypertension    Hypothyroidism    HYPOTHYROIDISM 01/16/2010   Qualifier: Diagnosis of  By: Inda Castle FNP, Melissa S    LIPOMA 05/15/2010   Qualifier: Diagnosis of  By: Inda Castle FNP, Melissa S    Lumbar spondylosis 02/18/2017   Formatting of this note might be different from the original. Noted on ([facility]) CT scan 02/2017   Mixed hyperlipidemia 07/18/2015   Last Assessment & Plan:  Formatting of this note might be different from the original. Relevant Hx: Course: Daily Update: Today's Plan:she is going to have her lipids updated fasting  Electronically signed by: Mayer Camel, NP 07/22/15 2112   OBESITY, MORBID 01/16/2010   Qualifier: Diagnosis of  By: Inda Castle FNP, [place]   Formatting of this note might be different from the original. Overview:  Qualifier: Diagnosis of  By: Inda Castle FNP, Melissa S   Obstructive sleep apnea syndrome 01/16/2010   last sleep study over five years Formatting of this note might be different from the original. Overview:  Qualifier: Diagnosis of  By: Inda Castle FNP, [place]:  Formatting of this note might be different from the original. Relevant Hx: Course: Daily Update: Today's Plan:she cannot wear the mask for this and have discussed with her how this can affect her by not doing so     Osteopenia of multiple sites 06/02/2017   Formatting of this note might be different from the original. -2.3   Other acute sinusitis 01/29/2010   Qualifier: Diagnosis of  By: Joya Gaskins MD, Burnett Harry    Prediabetes 07/22/2015   Last Assessment & Plan:  Formatting of this note might be different from the original. Relevant Hx: Course: Daily Update: Today's Plan:would update her HGBA1C for her and with her use of the steroids this can be elevated for her  Electronically signed by: Mayer Camel, NP 07/22/15 2109   Right rotator cuff tear 08/09/2014   Severe persistent asthma 01/09/2015   Nargis presents today with a one week history of wheezing and DOE and slight cough requiring her to use a SABA a few times per day. In addition, has nasal congestion and green rhinorrhea. She has been off her prednisone for over two months and was doing very well until this flare up. She continue on her Nucala and her large collection of medication for inflammation and reflux. Her reflux is under c   Shortness of breath dyspnea    with exertion   Sleep apnea    last sleep study over five years   SLEEP APNEA, OBSTRUCTIVE 01/16/2010   Qualifier: Diagnosis of  By: Inda Castle FNP, Melissa S    Thyroid disease    hypothyroidism   Vitamin D deficiency 02/02/2010   Qualifier: Diagnosis of  By: Inda Castle FNP, Melissa S   Formatting of this note might be different from the original. Overview:  Qualifier: Diagnosis of  By: Inda Castle FNP, [place]    Past Surgical History:  Procedure Laterality Date   CARPAL TUNNEL RELEASE Right    EYE SURGERY Bilateral    cataract removal    KNEE SURGERY  2007   right knee   LIPOMA EXCISION Right 03/28/2019   Procedure: EXCISION SUBCUTANEOUS LIPOMA RIGHT  SHOULDER;  Surgeon: Donnie Mesa, MD;  Location: [facility];  Service: General;  Laterality:  Right;   SHOULDER ARTHROSCOPY WITH OPEN ROTATOR CUFF REPAIR AND DISTAL CLAVICLE ACROMINECTOMY Right 08/09/2014   Procedure: SHOULDER ARTHROSCOPY WITH OPEN ROTATOR CUFF REPAIR AND DISTAL CLAVICLE ACROMINECTOMY;  Surgeon: Earlie Server, MD;  Location: [facility];  Service: Orthopedics;  Laterality: Right;   SINUS SURGERY WITH INSTATRAK     ???    Review of systems negative except as noted in HPI / PMHx or noted below:  Review of Systems  Constitutional: Negative.   HENT: Negative.    Eyes: Negative.   Respiratory: Negative.    Cardiovascular: Negative.   Gastrointestinal: Negative.   Genitourinary: Negative.   Musculoskeletal: Negative.   Skin: Negative.   Neurological: Negative.   Endo/Heme/Allergies: Negative.   Psychiatric/Behavioral: Negative.       Objective:   Vitals:   06/28/22 1711 06/28/22 1718  BP: 118/64   Pulse: 68   Resp: 16   SpO2: 93% 90%          Physical Exam Constitutional:      Appearance: She is not diaphoretic.  HENT:     Head: Normocephalic.     Right Ear: Tympanic membrane, ear canal and external ear normal.     Left Ear: Tympanic membrane, ear canal and external ear normal.     Nose: Nose normal. No mucosal edema or rhinorrhea.     Mouth/Throat:     Pharynx: Uvula midline. No oropharyngeal exudate.  Eyes:     Conjunctiva/sclera: Conjunctivae normal.  Neck:     Thyroid: No thyromegaly.     Trachea: Trachea normal. No tracheal tenderness or tracheal deviation.  Cardiovascular:     Rate and Rhythm: Normal rate and regular rhythm.     Heart sounds: Normal heart sounds, S1 normal and S2 normal. No murmur heard. Pulmonary:     Effort: No respiratory distress.     Breath sounds: Normal breath sounds. No stridor. No wheezing or rales.  Lymphadenopathy:     Head:     Right side of head: No tonsillar adenopathy.     Left side of head: No tonsillar adenopathy.      Cervical: No cervical adenopathy.  Skin:    Findings: No erythema or rash.     Nails: There is no clubbing.  Neurological:     Mental Status: She is alert.     Diagnostics:   Oxygen saturation on room air at rest was 93% Oxygen saturation on room air while walking the hallway was 90%  Assessment and Plan:   1. Not well controlled severe persistent asthma   2. Other allergic rhinitis   3. LPRD (laryngopharyngeal reflux disease)    1. Continue Breztri - 2 inhalations 2 times per day    2. Continue Benralizumab injections  3. Continue nasonex 1 spray each nostril 1-2 times per day.   4. Continue pantoprazole 40mg  1-2 times per day  5. Continue AirSupra (Coupon) or Albuterol nebulization if needed.  6. Can add OTC antihistamine - Zyrtec / Allegra / Claritin, nasal saline if needed  7. Depomedrol 80 IM delivered in clinic today  8. Further evaluation??? Yes, if not responding to treatment.   9. Return to clinic in 6 months or earlier if problem  Kyela appears to have some inflammation in her airway and we will treat her with a systemic steroid and she will report back on her response to this approach while she also maintains a large collection of medical therapy directed against respiratory tract inflammation and reflux as noted above.  If she does not respond optimally we need to consider other etiologic factors contributing to her dyspnea including steroid unresponsive inflammation of airway, cardiac dysfunction, pulmonary embolus.  Allena Katz, MD Allergy / [facility]ology [facility]

## 2022-06-28 NOTE — Patient Instructions (Addendum)
  1. Continue Breztri - 2 inhalations 2 times per day    2. Continue Benralizumab injections  3. Continue nasonex 1 spray each nostril 1-2 times per day.   4. Continue pantoprazole 40mg  1-2 times per day  5. Continue AirSupra (Coupon) or Albuterol nebulization if needed.  6. Can add OTC antihistamine - Zyrtec / Allegra / Claritin, nasal saline if needed  7. Depomedrol 80 IM delivered in clinic today  8. Further evaluation??? Yes, if not responding to treatment.   9. Return to clinic in 6 months or earlier if problem

## 2022-06-29 ENCOUNTER — Encounter: Payer: Self-pay | Admitting: Allergy and Immunology

## 2022-07-05 ENCOUNTER — Ambulatory Visit: Payer: BC Managed Care – PPO

## 2022-07-05 ENCOUNTER — Encounter: Payer: Self-pay | Admitting: Allergy and Immunology

## 2022-07-05 ENCOUNTER — Ambulatory Visit: Payer: BC Managed Care – PPO | Admitting: Allergy and Immunology

## 2022-07-05 VITALS — BP 138/82 | HR 72 | Resp 20

## 2022-07-05 DIAGNOSIS — J4550 Severe persistent asthma, uncomplicated: Secondary | ICD-10-CM | POA: Diagnosis not present

## 2022-07-05 DIAGNOSIS — K219 Gastro-esophageal reflux disease without esophagitis: Secondary | ICD-10-CM | POA: Diagnosis not present

## 2022-07-05 DIAGNOSIS — J3089 Other allergic rhinitis: Secondary | ICD-10-CM | POA: Diagnosis not present

## 2022-07-05 DIAGNOSIS — R0609 Other forms of dyspnea: Secondary | ICD-10-CM

## 2022-07-05 MED ORDER — BREZTRI AEROSPHERE 160-9-4.8 MCG/ACT IN AERO: INHALATION_SPRAY | RESPIRATORY_TRACT | 5 refills | Status: DC

## 2022-07-05 NOTE — Progress Notes (Unsigned)
[facility] [place] [facility] [place]   Follow-up Note  Referring Provider: [name], MD Primary Provider: [name], MD Date of Office Visit: 07/05/2022  Subjective:   Beth Rodriguez (DOB: 1960/11/10) is a 62 y.o. female who returns to the [facility] on 07/05/2022 in re-evaluation of the following:  HPI: Beth Rodriguez returns to this clinic in evaluation of continued breathing problems in the setting of asthma, allergic rhinitis, chronic sinusitis, LPR and a history of acute vertigo.  I last saw her in this clinic 28 June 2022.  During her last visit she noted dyspnea on exertion along with some cough unassociated with any chest pain or leg swelling and she felt like this was her usual asthma and we gave her an injection of systemic steroids.  She is really not have much better.  She still has a bit of cough especially some tickle in her upper chest and she still has some clear phlegm and she has had some wheezing but what bothers her the most is that she is completely out of breath if she exerts herself.  If she is sitting in a chair at rest she does pretty well.  During her last visit we did make her walk the hallway and her oxygen dropped from an oxygen saturation of 94% at rest to 90% during that activity.  Allergies as of 07/05/2022       Reactions   Penicillins Hives, Other (See Comments)   Did it involve swelling of the face/tongue/throat, SOB, or low BP? No Did it involve sudden or severe rash/hives, skin peeling, or any reaction on the inside of your mouth or nose?    #  #  YES  #  #  Did you need to seek medical attention at a hospital or doctor's office?    #  #  YES  #  #  When did it last happen?     2019    Theophyllines Other (See Comments)   HEADACHE Severe headaches   Theophylline Other (See Comments)   REACTION: headaches        Medication List    Airsupra 90-80 MCG/ACT Aero Generic drug:  Albuterol-Budesonide Inhale 2 puffs into the lungs as needed (every four to six hours for cough or wheeze.  Rinse, gargle, and spit after use).   albuterol (2.5 MG/3ML) 0.083% nebulizer solution Commonly known as: PROVENTIL Take 2.5 mg by nebulization every 6 (six) hours as needed for wheezing or shortness of breath.   albuterol 108 (90 Base) MCG/ACT inhaler Commonly known as: VENTOLIN HFA Can inhale two puffs every four to six hours as needed for cough or wheeze.   aspirin EC 81 MG tablet Take 81 mg by mouth daily.   atorvastatin 10 MG tablet Commonly known as: LIPITOR Take 1 tablet (10 mg total) by mouth daily.   Breztri Aerosphere 160-9-4.8 MCG/ACT Aero Generic drug: Budeson-Glycopyrrol-Formoterol Inhale 2 puffs into the lungs 2 (two) times daily.   Fasenra 30 MG/ML Sosy Generic drug: Benralizumab Inject 1 mL (30 mg total) into the skin every 8 (eight) weeks.   fluticasone 50 MCG/ACT nasal spray Commonly known as: FLONASE __________________________   hydrochlorothiazide 12.5 MG capsule Commonly known as: MICROZIDE TAKE 1 CAPSULE BY MOUTH EVERY DAY   ibandronate 150 MG tablet Commonly known as: BONIVA Take 1 tablet by mouth every 28 (twenty-eight) days.   levothyroxine 88 MCG tablet Commonly known as: SYNTHROID Take 88 mcg by  mouth daily before breakfast.   loratadine 10 MG tablet Commonly known as: CLARITIN Take 10 mg by mouth daily as needed for allergies. Reported on 07/21/2015   losartan 100 MG tablet Commonly known as: COZAAR Take 100 mg by mouth daily.   methocarbamol 500 MG tablet Commonly known as: ROBAXIN Take by mouth.   pantoprazole 40 MG tablet Commonly known as: PROTONIX Take 40 mg by mouth 2 (two) times daily.   Systane Complete 0.6 % Soln Generic drug: Propylene Glycol Place 1 drop into both eyes daily as needed (dry eyes).   tiZANidine 4 MG tablet Commonly known as: ZANAFLEX Take 4 mg by mouth every 6 (six) hours as needed for muscle  spasms.    Past Medical History:  Diagnosis Date   Abnormal stress test 04/18/2017   Acquired hypothyroidism 07/18/2015   Last Assessment & Plan:  Formatting of this note might be different from the original. Relevant Hx: Course: Daily Update: Today's Plan:she is going to have her TSH updated for her   Electronically signed by: Mayer Camel, NP 07/22/15 2115   ADRENAL INSUFFICIENCY, HX OF 01/23/2010   Qualifier: Diagnosis of  By: Inda Castle FNP, Melissa S    Allergic rhinitis 12/12/2014   Aortic atherosclerosis ([place]) 02/18/2017   Formatting of this note might be different from the original. Noted on CT scan ([facility]) 02/2017   Arthritis    Asthma    ASTHMA 01/16/2010   Qualifier: Diagnosis of  By: Inda Castle FNP, Melissa S    Cardiomegaly 01/16/2010   Qualifier: Diagnosis of  By: Inda Castle FNP, Melissa S    Carpal tunnel syndrome on right    Coronary artery disease 07/21/2017   With mildly abnormal stress test showing small defect involving apex indicating distal LAD disease  Formatting of this note might be different from the original. Overview:  With mildly abnormal stress test showing small defect involving apex indicating distal LAD disease   COUGH, CHRONIC 01/23/2010   Qualifier: Diagnosis of  By: Inda Castle FNP, Melissa S    DEEP VENOUS THROMBOPHLEBITIS, LEG, RIGHT, HX OF 01/16/2010   Qualifier: Diagnosis of  By: Inda Castle FNP, Melissa S    Degeneration of lumbar intervertebral disc 07/18/2015   Last Assessment & Plan:  Formatting of this note might be different from the original. Relevant Hx: Course: Daily Update: Today's Plan:longstanding for her and she is still working and on her feet some days being harder for her than others, she is to again work on her weight to help with this  Electronically signed by: Mayer Camel, NP [id number]   DIASTOLIC DYSFUNCTION [id number]   Annotation: grade 2 per 2-D echo 01/26/10 Qualifier: Diagnosis of  By: Inda Castle  FNP, Melissa S    Diverticulosis 10/03/2017   DYSPNEA ON EXERTION 01/16/2010   Qualifier: Diagnosis of  By: Inda Castle FNP, Melissa S    Dyspnea on exertion 03/17/2017   Edema 01/16/2010   Qualifier: Diagnosis of  By: Inda Castle FNP, Melissa S    Embolism - blood clot 2007   right leg hx of   Enlarged heart    Essential hypertension 01/29/2010   Qualifier: Diagnosis of  By: Ronnald Ramp RN, Crystal    Last Assessment & Plan:  Formatting of this note might be different from the original. Relevant Hx: Course: Daily Update: Today's Plan:stable for her and will follow this for her  Electronically signed by: Mayer Camel, NP 07/22/15 2113   Gastroesophageal reflux disease without esophagitis 07/18/2015   Last  Assessment & Plan:  Formatting of this note might be different from the original. Relevant Hx: Course: Daily Update: Today's Plan:this is stable for her at this time and will follow her  Electronically signed by: Mayer Camel, NP 07/22/15 2116   GERD (gastroesophageal reflux disease)    GERD, SEVERE 02/01/2010   Qualifier: Diagnosis of  By: Joya Gaskins MD, Burnett Harry    Hypertension    Hypothyroidism    HYPOTHYROIDISM 01/16/2010   Qualifier: Diagnosis of  By: Inda Castle FNP, Melissa S    LIPOMA 05/15/2010   Qualifier: Diagnosis of  By: Inda Castle FNP, Melissa S    Lumbar spondylosis 02/18/2017   Formatting of this note might be different from the original. Noted on ([facility]) CT scan 02/2017   Mixed hyperlipidemia 07/18/2015   Last Assessment & Plan:  Formatting of this note might be different from the original. Relevant Hx: Course: Daily Update: Today's Plan:she is going to have her lipids updated fasting  Electronically signed by: Mayer Camel, NP 07/22/15 2112   OBESITY, MORBID 01/16/2010   Qualifier: Diagnosis of  By: Inda Castle FNP, [place]   Formatting of this note might be different from the original. Overview:  Qualifier: Diagnosis of  By: Inda Castle FNP,  Melissa S   Obstructive sleep apnea syndrome 01/16/2010   last sleep study over five years Formatting of this note might be different from the original. Overview:  Qualifier: Diagnosis of  By: Inda Castle FNP, Erma:  Formatting of this note might be different from the original. Relevant Hx: Course: Daily Update: Today's Plan:she cannot wear the mask for this and have discussed with her how this can affect her by not doing so     Osteopenia of multiple sites 06/02/2017   Formatting of this note might be different from the original. -2.3   Other acute sinusitis 01/29/2010   Qualifier: Diagnosis of  By: Joya Gaskins MD, Burnett Harry    Prediabetes 07/22/2015   Last Assessment & Plan:  Formatting of this note might be different from the original. Relevant Hx: Course: Daily Update: Today's Plan:would update her HGBA1C for her and with her use of the steroids this can be elevated for her  Electronically signed by: Mayer Camel, NP 07/22/15 2109   Right rotator cuff tear 08/09/2014   Severe persistent asthma 01/09/2015   Beth Rodriguez presents today with a one week history of wheezing and DOE and slight cough requiring her to use a SABA a few times per day. In addition, has nasal congestion and green rhinorrhea. She has been off her prednisone for over two months and was doing very well until this flare up. She continue on her Nucala and her large collection of medication for inflammation and reflux. Her reflux is under c   Shortness of breath dyspnea    with exertion   Sleep apnea    last sleep study over five years   SLEEP APNEA, OBSTRUCTIVE 01/16/2010   Qualifier: Diagnosis of  By: Inda Castle FNP, Melissa S    Thyroid disease    hypothyroidism   Vitamin D deficiency 02/02/2010   Qualifier: Diagnosis of  By: Inda Castle FNP, [place]   Formatting of this note might be different from the original. Overview:  Qualifier: Diagnosis of  By: Inda Castle FNP, Wellington [place]    Past Surgical  History:  Procedure Laterality Date   CARPAL TUNNEL RELEASE Right    EYE SURGERY Bilateral    cataract removal  KNEE SURGERY  2007   right knee   LIPOMA EXCISION Right 03/28/2019   Procedure: EXCISION SUBCUTANEOUS LIPOMA RIGHT SHOULDER;  Surgeon: Donnie Mesa, MD;  Location: [facility];  Service: General;  Laterality: Right;   SHOULDER ARTHROSCOPY WITH OPEN ROTATOR CUFF REPAIR AND DISTAL CLAVICLE ACROMINECTOMY Right 08/09/2014   Procedure: SHOULDER ARTHROSCOPY WITH OPEN ROTATOR CUFF REPAIR AND DISTAL CLAVICLE [place];  Surgeon: Earlie Server, MD;  Location: [facility];  Service: Orthopedics;  Laterality: Right;   SINUS SURGERY WITH INSTATRAK     ???    Review of systems negative except as noted in HPI / PMHx or noted below:  Review of Systems  Constitutional: Negative.   HENT: Negative.    Eyes: Negative.   Respiratory: Negative.    Cardiovascular: Negative.   Gastrointestinal: Negative.   Genitourinary: Negative.   Musculoskeletal: Negative.   Skin: Negative.   Neurological: Negative.   Endo/Heme/Allergies: Negative.   Psychiatric/Behavioral: Negative.       Objective:   Vitals:   07/05/22 1400 07/05/22 1446  BP: (!) 144/90 138/82  Pulse: 72   Resp: 20   SpO2: 96%           Physical Exam Constitutional:      Appearance: She is not diaphoretic.  HENT:     Head: Normocephalic.     Right Ear: Tympanic membrane, ear canal and external ear normal.     Left Ear: Tympanic membrane, ear canal and external ear normal.     Nose: Nose normal. No mucosal edema or rhinorrhea.     Mouth/Throat:     Pharynx: Uvula midline. No oropharyngeal exudate.  Eyes:     Conjunctiva/sclera: Conjunctivae normal.  Neck:     Thyroid: No thyromegaly.     Trachea: Trachea normal. No tracheal tenderness or tracheal deviation.  Cardiovascular:     Rate and Rhythm: Normal rate and regular rhythm.     Heart sounds: Normal heart sounds, S1 normal and S2 normal. No murmur heard. Pulmonary:      Effort: No respiratory distress.     Breath sounds: No stridor. Wheezing (Scattered inspiratory expiratory wheeze) present. No rales.  Lymphadenopathy:     Head:     Right side of head: No tonsillar adenopathy.     Left side of head: No tonsillar adenopathy.     Cervical: No cervical adenopathy.  Skin:    Findings: No erythema or rash.     Nails: There is no clubbing.  Neurological:     Mental Status: She is alert.     Diagnostics: Spirometry was performed and demonstrated an FEV1 of 1.25 at 59 % of predicted.  Assessment and Plan:   1. Not well controlled severe persistent asthma   2. Dyspnea on exertion   3. Other allergic rhinitis   4. LPRD (laryngopharyngeal reflux disease)    1. Continue Breztri - 2 inhalations 2 times per day    2. Continue nasonex 1 spray each nostril 1-2 times per day.   3. Continue pantoprazole 40mg  1-2 times per day  4. Continue AirSupra (Coupon) or Albuterol nebulization if needed.  5. Can add OTC antihistamine - Zyrtec / Allegra / Claritin, nasal saline if needed  6. For this event:   A. Obtain chest x-ray  B. Obtain blood - CBC w/d, BNP  C. Prednisone 10 mg - 2 tabs daily x 7 days, then 1 tab daily x 7 days  D. Start tezepelumab injection to replace benralizumab injection.   8. Further  evaluation??? Yes, if not responding to treatment.   9. Return to clinic in 6 months or earlier if problem  Beth Rodriguez is not really responding to her usual therapy directed against respiratory tract inflammation regarding her respiratory tract symptoms as she has in the past.  We have given her systemic steroid and this did not really appear to make much of an impact.  I am going to give her some additional systemic teroids and we will start her on an anti-TSLP antibody and as well I am going to obtain a chest x-ray and some blood tests in investigation of other etiologic factors that may be contributing to her dyspnea on exertion.  I will contact her once the  results of the blood tests and x-rays are available for review.  Allena Katz, MD [facility]

## 2022-07-05 NOTE — Patient Instructions (Addendum)
  1. Continue Breztri - 2 inhalations 2 times per day    2. Continue nasonex 1 spray each nostril 1-2 times per day.   3. Continue pantoprazole 40mg  1-2 times per day  4. Continue AirSupra (Coupon) or Albuterol nebulization if needed.  5. Can add OTC antihistamine - Zyrtec / Allegra / Claritin, nasal saline if needed  6. For this event:   A. Obtain chest x-ray  B. Obtain blood - CBC w/d, BNP  C. Prednisone 10 mg - 2 tabs daily x 7 days, then 1 tab daily x 7 days  D. Start tezepelumab injection to replace benralizumab injection.   8. Further evaluation??? Yes, if not responding to treatment.   9. Return to clinic in 6 months or earlier if problem

## 2022-07-06 ENCOUNTER — Encounter: Payer: Self-pay | Admitting: Allergy and Immunology

## 2022-07-07 ENCOUNTER — Ambulatory Visit (INDEPENDENT_AMBULATORY_CARE_PROVIDER_SITE_OTHER): Payer: BC Managed Care – PPO | Admitting: *Deleted

## 2022-07-07 DIAGNOSIS — J4550 Severe persistent asthma, uncomplicated: Secondary | ICD-10-CM

## 2022-07-07 MED ORDER — TEZEPELUMAB-EKKO 210 MG/1.91ML ~~LOC~~ SOSY
210.0000 mg | PREFILLED_SYRINGE | SUBCUTANEOUS | Status: AC
  Administered 2022-07-07 – 2024-05-16 (×24): 210 mg via SUBCUTANEOUS

## 2022-07-07 NOTE — Progress Notes (Unsigned)
Started Tezspire today and will continue every 4 weeks. Reports no issues after injection.

## 2022-07-08 ENCOUNTER — Telehealth: Payer: Self-pay | Admitting: *Deleted

## 2022-07-08 NOTE — Telephone Encounter (Signed)
L/m for patient approval and submit for Tezspire to [facility] to replace fasenra

## 2022-07-10 ENCOUNTER — Encounter: Payer: Self-pay | Admitting: Internal Medicine

## 2022-07-10 MED ORDER — CEFDINIR 300 MG PO CAPS: 300.0000 mg | ORAL_CAPSULE | Freq: Two times a day (BID) | ORAL | 0 refills | Status: AC

## 2022-07-10 NOTE — Progress Notes (Signed)
Patient called reporting worsening in symptoms  She has been having a productive cough for weeks but the past few days it has become a lot thicker and now green. Also with shortness of breath and wheezing with activity. On prednisone since seeing Dr. Neldon Mc 5 days ago and cxr was negative. Reports increased nasal drainage and rhinorrhea with this also. Already switched from fasenra to tezpspire recently. Discussed doing a short cours e of abx to see if this is bacterial. Has a history of penicillin allergy so will do omnicef.

## 2022-07-12 ENCOUNTER — Ambulatory Visit: Payer: BC Managed Care – PPO | Admitting: Allergy and Immunology

## 2022-07-12 ENCOUNTER — Encounter: Payer: Self-pay | Admitting: Allergy and Immunology

## 2022-07-12 VITALS — BP 126/72 | HR 84 | Resp 18

## 2022-07-12 DIAGNOSIS — J4550 Severe persistent asthma, uncomplicated: Secondary | ICD-10-CM

## 2022-07-12 DIAGNOSIS — K219 Gastro-esophageal reflux disease without esophagitis: Secondary | ICD-10-CM | POA: Diagnosis not present

## 2022-07-12 DIAGNOSIS — J3089 Other allergic rhinitis: Secondary | ICD-10-CM

## 2022-07-12 MED ORDER — IPRATROPIUM-ALBUTEROL 0.5-2.5 (3) MG/3ML IN SOLN: RESPIRATORY_TRACT | 1 refills | Status: DC

## 2022-07-12 MED ORDER — AZITHROMYCIN 500 MG PO TABS: ORAL_TABLET | ORAL | 0 refills | Status: DC

## 2022-07-12 MED ORDER — FLUTICASONE PROPIONATE 50 MCG/ACT NA SUSP: NASAL | 5 refills | Status: DC

## 2022-07-12 NOTE — Progress Notes (Unsigned)
- [facility]   Follow-up Note  Referring Provider: Raina Rodriguez., MD Primary Provider: Raina Rodriguez., MD Date of Office Visit: 07/12/2022  Subjective:   Beth Rodriguez (DOB: 21-Nov-1960) is a 62 y.o. female who returns to the [facility] on 07/12/2022 in re-evaluation of the following:  HPI: [name] returns to this clinic in evaluation of asthma, allergic rhinitis, history of chronic sinusitis, LPR.  I last saw her in this clinic 05 July 2022.  During her last visit she just did not respond well to a plan of systemic steroids administered by intramuscular injection using Depo-Medrol 80 mg 1 week prior and we started her on tezepelumab injection and started her on prednisone 20 mg a day for 7 days and then 10 mg a day for 7 days.  With that plan she was actually doing relatively well but 4 days ago she had acute onset of coughing and sneezing and green nasal discharge and unable to breathe and she contacted Dr. Posey Pronto who started her on Omnicef.  She has had 2 days of Omnicef administration.  She might be slightly better in that she is not coughing up any green stuff anymore but she still has lots of wheezing and coughing.  She did test herself for COVID with a home swab which was negative.  Allergies as of 07/12/2022       Reactions   Penicillins Hives, Other (See Comments)   Did it involve swelling of the face/tongue/throat, SOB, or low BP? No Did it involve sudden or severe rash/hives, skin peeling, or any reaction on the inside of your mouth or nose?    #  #  YES  #  #  Did you need to seek medical attention at a hospital or doctor's office?    #  #  YES  #  #  When did it last happen?     2019    Theophyllines Other (See Comments)   HEADACHE Severe headaches   Theophylline Other (See Comments)   REACTION: headaches        Medication List    Airsupra 90-80 MCG/ACT Aero Generic drug: Albuterol-Budesonide Inhale 2  puffs into the lungs as needed (every four to six hours for cough or wheeze.  Rinse, gargle, and spit after use).   albuterol (2.5 MG/3ML) 0.083% nebulizer solution Commonly known as: PROVENTIL Take 2.5 mg by nebulization every 6 (six) hours as needed for wheezing or shortness of breath.   albuterol 108 (90 Base) MCG/ACT inhaler Commonly known as: VENTOLIN HFA Can inhale two puffs every four to six hours as needed for cough or wheeze.   aspirin EC 81 MG tablet Take 81 mg by mouth daily.   atorvastatin 10 MG tablet Commonly known as: LIPITOR Take 1 tablet (10 mg total) by mouth daily.   Breztri Aerosphere 160-9-4.8 MCG/ACT Aero Generic drug: Budeson-Glycopyrrol-Formoterol Inhale two puffs twice daily to prevent cough or wheeze.  Rinse, gargle, and spit after use.   cefdinir 300 MG capsule Commonly known as: OMNICEF Take 1 capsule (300 mg total) by mouth 2 (two) times daily for 5 days.   Fasenra 30 MG/ML Sosy Generic drug: Benralizumab Inject 1 mL (30 mg total) into the skin every 8 (eight) weeks.   fluticasone 50 MCG/ACT nasal spray Commonly known as: FLONASE __________________________   hydrochlorothiazide 12.5 MG capsule Commonly known as: MICROZIDE TAKE 1 CAPSULE BY MOUTH EVERY DAY What changed: how much  to take   ibandronate 150 MG tablet Commonly known as: BONIVA Take 1 tablet by mouth every 28 (twenty-eight) days.   levothyroxine 88 MCG tablet Commonly known as: SYNTHROID Take 88 mcg by mouth daily before breakfast.   loratadine 10 MG tablet Commonly known as: CLARITIN Take 10 mg by mouth daily as needed for allergies. Reported on 07/21/2015   losartan 100 MG tablet Commonly known as: COZAAR Take 100 mg by mouth daily.   methocarbamol 500 MG tablet Commonly known as: ROBAXIN Take by mouth.   pantoprazole 40 MG tablet Commonly known as: PROTONIX Take 40 mg by mouth 2 (two) times daily.   Systane Complete 0.6 % Soln Generic drug: Propylene  Glycol Place 1 drop into both eyes daily as needed (dry eyes).   tiZANidine 4 MG tablet Commonly known as: ZANAFLEX Take 4 mg by mouth every 6 (six) hours as needed for muscle spasms.    Past Medical History:  Diagnosis Date   Abnormal stress test 04/18/2017   Acquired hypothyroidism 07/18/2015   Last Assessment & Plan:  Formatting of this note might be different from the original. Relevant Hx: Course: Daily Update: Today's Plan:she is going to have her TSH updated for her   Electronically signed by: Mayer Camel, NP 07/22/15 2115   ADRENAL INSUFFICIENCY, HX OF 01/23/2010   Qualifier: Diagnosis of  By: Inda Castle FNP, Melissa S    Allergic rhinitis 12/12/2014   Aortic atherosclerosis 02/18/2017   Formatting of this note might be different from the original. Noted on CT scan ([facility]) 02/2017   Arthritis    Asthma    ASTHMA 01/16/2010   Qualifier: Diagnosis of  By: Inda Castle FNP, Melissa S    Cardiomegaly 01/16/2010   Qualifier: Diagnosis of  By: Inda Castle FNP, Melissa S    Carpal tunnel syndrome on right    Coronary artery disease 07/21/2017   With mildly abnormal stress test showing small defect involving apex indicating distal LAD disease  Formatting of this note might be different from the original. Overview:  With mildly abnormal stress test showing small defect involving apex indicating distal LAD disease   COUGH, CHRONIC 01/23/2010   Qualifier: Diagnosis of  By: Inda Castle FNP, Melissa S    DEEP VENOUS THROMBOPHLEBITIS, LEG, RIGHT, HX OF 01/16/2010   Qualifier: Diagnosis of  By: Inda Castle FNP, Melissa S    Degeneration of lumbar intervertebral disc 07/18/2015   Last Assessment & Plan:  Formatting of this note might be different from the original. Relevant Hx: Course: Daily Update: Today's Plan:longstanding for her and she is still working and on her feet some days being harder for her than others, she is to again work on her weight to help with this  Electronically  signed by: Mayer Camel, NP [id number]   DIASTOLIC DYSFUNCTION [id number]   Annotation: grade 2 per 2-D echo 01/26/10 Qualifier: Diagnosis of  By: Inda Castle FNP, Melissa S    Diverticulosis 10/03/2017   DYSPNEA ON EXERTION 01/16/2010   Qualifier: Diagnosis of  By: Inda Castle FNP, Melissa S    Dyspnea on exertion 03/17/2017   Edema 01/16/2010   Qualifier: Diagnosis of  By: Inda Castle FNP, Melissa S    Embolism - blood clot 2007   right leg hx of   Enlarged heart    Essential hypertension 01/29/2010   Qualifier: Diagnosis of  By: Ronnald Ramp RN, Crystal    Last Assessment & Plan:  Formatting of this note might be different from the original. Relevant Hx:  Course: Daily Update: Today's Plan:stable for her and will follow this for her  Electronically signed by: Mayer Camel, NP 07/22/15 2113   Gastroesophageal reflux disease without esophagitis 07/18/2015   Last Assessment & Plan:  Formatting of this note might be different from the original. Relevant Hx: Course: Daily Update: Today's Plan:this is stable for her at this time and will follow her  Electronically signed by: Mayer Camel, NP 07/22/15 2116   GERD (gastroesophageal reflux disease)    GERD, SEVERE 02/01/2010   Qualifier: Diagnosis of  By: Joya Gaskins MD, Burnett Harry    Hypertension    Hypothyroidism    HYPOTHYROIDISM 01/16/2010   Qualifier: Diagnosis of  By: Inda Castle FNP, Melissa S    LIPOMA 05/15/2010   Qualifier: Diagnosis of  By: Inda Castle FNP, Melissa S    Lumbar spondylosis 02/18/2017   Formatting of this note might be different from the original. Noted on ([facility]) CT scan 02/2017   Mixed hyperlipidemia 07/18/2015   Last Assessment & Plan:  Formatting of this note might be different from the original. Relevant Hx: Course: Daily Update: Today's Plan:she is going to have her lipids updated fasting  Electronically signed by: Mayer Camel, NP 07/22/15 2112   OBESITY, MORBID 01/16/2010    Qualifier: Diagnosis of  By: Inda Castle FNP, [place]   Formatting of this note might be different from the original. Overview:  Qualifier: Diagnosis of  By: Inda Castle FNP, Melissa S   Obstructive sleep apnea syndrome 01/16/2010   last sleep study over five years Formatting of this note might be different from the original. Overview:  Qualifier: Diagnosis of  By: Inda Castle FNP, [place]:  Formatting of this note might be different from the original. Relevant Hx: Course: Daily Update: Today's Plan:she cannot wear the mask for this and have discussed with her how this can affect her by not doing so     Osteopenia of multiple sites 06/02/2017   Formatting of this note might be different from the original. -2.3   Other acute sinusitis 01/29/2010   Qualifier: Diagnosis of  By: Joya Gaskins MD, Burnett Harry    Prediabetes 07/22/2015   Last Assessment & Plan:  Formatting of this note might be different from the original. Relevant Hx: Course: Daily Update: Today's Plan:would update her HGBA1C for her and with her use of the steroids this can be elevated for her  Electronically signed by: Mayer Camel, NP 07/22/15 2109   Right rotator cuff tear 08/09/2014   Severe persistent asthma 01/09/2015   Beth Rodriguez presents today with a one week history of wheezing and DOE and slight cough requiring her to use a SABA a few times per day. In addition, has nasal congestion and green rhinorrhea. She has been off her prednisone for over two months and was doing very well until this flare up. She continue on her Nucala and her large collection of medication for inflammation and reflux. Her reflux is under c   Shortness of breath dyspnea    with exertion   Sleep apnea    last sleep study over five years   SLEEP APNEA, OBSTRUCTIVE 01/16/2010   Qualifier: Diagnosis of  By: Inda Castle FNP, Melissa S    Thyroid disease    hypothyroidism   Vitamin D deficiency 02/02/2010   Qualifier: Diagnosis of   By: Inda Castle FNP, [place]   Formatting of this note might be different from the original. Overview:  Qualifier: Diagnosis of  By: Inda Castle FNP, [place]    Past Surgical History:  Procedure Laterality Date   CARPAL TUNNEL RELEASE Right    EYE SURGERY Bilateral    cataract removal    KNEE SURGERY  2007   right knee   LIPOMA EXCISION Right 03/28/2019   Procedure: EXCISION SUBCUTANEOUS LIPOMA RIGHT SHOULDER;  Surgeon: Donnie Mesa, MD;  Location: [facility];  Service: General;  Laterality: Right;   SHOULDER ARTHROSCOPY WITH OPEN ROTATOR CUFF REPAIR AND DISTAL CLAVICLE ACROMINECTOMY Right 08/09/2014   Procedure: SHOULDER ARTHROSCOPY WITH OPEN ROTATOR CUFF REPAIR AND DISTAL CLAVICLE [place];  Surgeon: Earlie Server, MD;  Location: [facility];  Service: Orthopedics;  Laterality: Right;   SINUS SURGERY WITH INSTATRAK     ???    Review of systems negative except as noted in HPI / PMHx or noted below:  Review of Systems  Constitutional: Negative.   HENT: Negative.    Eyes: Negative.   Respiratory: Negative.    Cardiovascular: Negative.   Gastrointestinal: Negative.   Genitourinary: Negative.   Musculoskeletal: Negative.   Skin: Negative.   Neurological: Negative.   Endo/Heme/Allergies: Negative.   Psychiatric/Behavioral: Negative.       Objective:   Vitals:   07/12/22 1137  BP: 126/72  Pulse: 84  Resp: 18  SpO2: 92%          Physical Exam Constitutional:      Appearance: She is not diaphoretic.  HENT:     Head: Normocephalic.     Right Ear: Tympanic membrane, ear canal and external ear normal.     Left Ear: Tympanic membrane, ear canal and external ear normal.     Nose: Nose normal. No mucosal edema or rhinorrhea.     Mouth/Throat:     Pharynx: Uvula midline. No oropharyngeal exudate.  Eyes:     Conjunctiva/sclera: Conjunctivae normal.  Neck:     Thyroid: No thyromegaly.     Trachea: Trachea normal. No tracheal tenderness or tracheal deviation.   Cardiovascular:     Rate and Rhythm: Normal rate and regular rhythm.     Heart sounds: Normal heart sounds, S1 normal and S2 normal. No murmur heard. Pulmonary:     Effort: No respiratory distress.     Breath sounds: No stridor. Wheezing (bilateral expiratory wheezes) present. No rales.  Lymphadenopathy:     Head:     Right side of head: No tonsillar adenopathy.     Left side of head: No tonsillar adenopathy.     Cervical: No cervical adenopathy.  Skin:    Findings: No erythema or rash.     Nails: There is no clubbing.  Neurological:     Mental Status: She is alert.     Diagnostics: none  Assessment and Plan:   1. Not well controlled severe persistent asthma   2. Other allergic rhinitis   3. LPRD (laryngopharyngeal reflux disease)    1. Continue Tezepelumab injections  2. Continue Breztri - 2 inhalations 2 times per day    3. Continue nasonex 1 spray each nostril 1-2 times per day.   4. Continue pantoprazole 40mg  1-2 times per day  5. Continue AirSupra (Coupon) or DUONEB nebulization if needed. Duoneb administered in clinic today  6. Can add OTC antihistamine - Zyrtec / Allegra / Claritin, nasal saline if needed  7. For this event:   A. Continue Omnicef  B. START Azithromycin 500 mg - 1 tablet 1 time per day for 3 days only  C. Stay on Prednisone 20  mg daily x 1 week, then 10 mg daily x 1 week  8. Return to clinic in 1 month or earlier if problem  Beth Rodriguez appears to have contracted a respiratory tract infection about 4 days ago as she was improving regarding her very severe inflammatory condition of her airway on a very large collection of medical therapy prior to that point in time.  Based upon her swab results this does not appear to be COVID but certainly her airway is very inflamed.  We will have her remain on prednisone 20 mg a day instead of tapering her as planned for an additional week and she can continue on her Omnicef and I will start her on a broad-spectrum  antibiotic to cover mycoplasma with the use of azithromycin.  We will see what happens as she utilizes this plan.  Allena Katz, MD [facility] [place]

## 2022-07-12 NOTE — Patient Instructions (Addendum)
  1. Continue Tezepelumab injections  2. Continue Breztri - 2 inhalations 2 times per day    3. Continue nasonex 1 spray each nostril 1-2 times per day.   4. Continue pantoprazole 40mg  1-2 times per day  5. Continue AirSupra (Coupon) or DUONEB nebulization if needed.  6. Can add OTC antihistamine - Zyrtec / Allegra / Claritin, nasal saline if needed  7. For this event:   A. Continue Omnicef  B. START Azithromycin 500 mg - 1 tablet 1 time per day for 3 days only  C. Stay on Prednisone 20 mg daily x 1 week, then 10 mg daily x 1 week  8. Return to clinic in 1 month or earlier if problem

## 2022-07-13 ENCOUNTER — Encounter: Payer: Self-pay | Admitting: Allergy and Immunology

## 2022-07-15 ENCOUNTER — Telehealth: Payer: Self-pay | Admitting: Allergy and Immunology

## 2022-07-15 NOTE — Telephone Encounter (Signed)
Called and informed patient of Dr. Kozlow's message. 

## 2022-07-15 NOTE — Telephone Encounter (Signed)
Patient called and states she is finally feeling a little better. She did finish the round of antibiotics today but feels that she needs more. She wants to know if Dr. Neldon Mc would be OK with sending more in.

## 2022-07-19 ENCOUNTER — Telehealth: Payer: Self-pay | Admitting: *Deleted

## 2022-07-19 ENCOUNTER — Other Ambulatory Visit: Payer: Self-pay | Admitting: *Deleted

## 2022-07-19 MED ORDER — PREDNISONE 10 MG PO TABS: ORAL_TABLET | ORAL | 0 refills | Status: DC

## 2022-07-19 NOTE — Telephone Encounter (Signed)
Clydie Braun informed and rx for Prednisone sent to pharmacy. She will call back to schedule follow up.

## 2022-07-19 NOTE — Telephone Encounter (Signed)
Beth Rodriguez says that her cough is not getting any better. She's still having a productive cough, she gets up green mucus. Please advise.

## 2022-07-21 ENCOUNTER — Encounter: Payer: Self-pay | Admitting: Allergy and Immunology

## 2022-07-22 ENCOUNTER — Other Ambulatory Visit: Payer: Self-pay

## 2022-07-22 MED ORDER — ATORVASTATIN CALCIUM 10 MG PO TABS: 10.0000 mg | ORAL_TABLET | Freq: Every day | ORAL | 2 refills | Status: DC

## 2022-08-04 ENCOUNTER — Ambulatory Visit (INDEPENDENT_AMBULATORY_CARE_PROVIDER_SITE_OTHER): Payer: BC Managed Care – PPO | Admitting: *Deleted

## 2022-08-04 DIAGNOSIS — J4550 Severe persistent asthma, uncomplicated: Secondary | ICD-10-CM

## 2022-08-16 ENCOUNTER — Other Ambulatory Visit: Payer: Self-pay | Admitting: Allergy and Immunology

## 2022-08-28 ENCOUNTER — Other Ambulatory Visit: Payer: Self-pay | Admitting: Allergy and Immunology

## 2022-08-31 ENCOUNTER — Other Ambulatory Visit: Payer: Self-pay | Admitting: *Deleted

## 2022-08-31 ENCOUNTER — Telehealth: Payer: Self-pay | Admitting: Allergy and Immunology

## 2022-08-31 MED ORDER — ALBUTEROL SULFATE HFA 108 (90 BASE) MCG/ACT IN AERS: INHALATION_SPRAY | RESPIRATORY_TRACT | 1 refills | Status: DC

## 2022-08-31 NOTE — Telephone Encounter (Signed)
Rx sent 

## 2022-08-31 NOTE — Telephone Encounter (Signed)
Patient is requesting a refill on her Albuterol inhaler. She states Paulene Floor was not working well for her.  Best pharmacy- [facility] in [place]

## 2022-09-01 ENCOUNTER — Ambulatory Visit (INDEPENDENT_AMBULATORY_CARE_PROVIDER_SITE_OTHER): Payer: BC Managed Care – PPO | Admitting: *Deleted

## 2022-09-01 DIAGNOSIS — J4550 Severe persistent asthma, uncomplicated: Secondary | ICD-10-CM | POA: Diagnosis not present

## 2022-09-11 NOTE — Patient Instructions (Incomplete)
  1. Continue Tezepelumab injections  2. Continue Breztri - 2 inhalations 2 times per day    3. Continue nasonex 1 spray each nostril 1-2 times per day.   4. Continue pantoprazole 40mg  1-2 times per day  5. Continue albuterol or DUONEB nebulization if needed.   6. Can add OTC antihistamine - Zyrtec / Allegra / Claritin, nasal saline if needed  7.Get CT chest without contrast - We will also get lab work for difficult to treat asthma -Schedule an appointment with your cardiologist to discuss dyspnea with exertion that does not respond to prednisone and albuterol/AirSupra  8. Return to clinic in 1-2 weeks or earlier if problem

## 2022-09-13 ENCOUNTER — Ambulatory Visit: Payer: BC Managed Care – PPO | Admitting: Family

## 2022-09-13 ENCOUNTER — Encounter: Payer: Self-pay | Admitting: Family

## 2022-09-13 VITALS — BP 126/82 | HR 84 | Resp 16

## 2022-09-13 DIAGNOSIS — J3089 Other allergic rhinitis: Secondary | ICD-10-CM

## 2022-09-13 DIAGNOSIS — J4550 Severe persistent asthma, uncomplicated: Secondary | ICD-10-CM

## 2022-09-13 DIAGNOSIS — R0609 Other forms of dyspnea: Secondary | ICD-10-CM | POA: Diagnosis not present

## 2022-09-13 DIAGNOSIS — K219 Gastro-esophageal reflux disease without esophagitis: Secondary | ICD-10-CM

## 2022-09-13 NOTE — Progress Notes (Addendum)
[address] [place] [address] Dept: [phone]  FOLLOW UP NOTE  Patient ID: Beth Rodriguez, female    DOB: 1961/03/25  Age: 62 y.o. MRN: [id number] Date of Office Visit: 09/13/2022  Assessment  Chief Complaint: Asthma  HPI Beth Rodriguez is a 62 year old female who presents today for an acute visit of dyspnea on exertion.  She was last seen on July 12, 2022 by Dr. Lucie Leather for not well-controlled severe persistent asthma, allergic rhinitis, and laryngopharyngeal reflux disease.  She denies any new diagnosis or surgery since her last office visit.  Not well-controlled severe persistent asthma: She continues to receive Tezspire injections every 4 weeks, Breztri 2 puffs 2 times a day with spacer, and albuterol or DuoNeb as needed.  She did not feel like air supra helped so she is back to using albuterol.  She also mentions that she has received 3 Tezspire injections now and cannot tell a difference since starting them.  She has previously been on [place]  She reports prior to getting COVID-19 the second time she was doing well on Fasenra injections, but this then changed.  She reports shortness of breath with exertion and occasional cough that started 2 to 3 days ago.  When the cough is productive what she coughs up is clear in color.  She denies wheezing, tightness in chest, nocturnal awakenings due to breathing problems, and fever/chills.  Her dyspnea exertion has been worse since at least March.  She has had a chest x-ray on July 06, 2022 showing: "Minimal scar or atelectasis of right lung base, stable.  No acute cardiopulmonary disease.  She also had normal NT-pro BN P on July 06, 2022.  She does not feel like prednisone that she has been prescribed in the past is really helped.  She had previously been prescribed on July 19, 2022 prednisone 20 mg once a day for 10 days, 15 mg for 10 days, then 10 mg for 10 days.  She has been using albuterol twice a day and when asked if it  helps reports that she "guesses it does".  She does have a history of coronary artery disease, diastolic congestive heart failure, cardiomegaly, and hypertension.  She reports that the last time she saw her cardiologist that everything was okay.  She is due to schedule a follow-up appointment with her cardiologist.  She does not mention for the past month that her heart has been beating fast.  This also occurred when she first started working at [employer].  She reports that she is just not able to keep up with her coworkers due to the dyspnea on exertion.  She also has bilateral leg swelling that comes and goes.  She denies chest pain.  Laryngopharyngeal reflux disease is reported as good and not a problem.  She continues to take pantoprazole 40 mg once a day.  Allergic rhinitis: She denies rhinorrhea, nasal congestion, and postnasal drip.  She has not had any sinus infections since we last saw her.  She continues to take Nasonex 1 spray each nostril twice a day and Claritin as needed.     Drug Allergies:  Allergies  Allergen Reactions   Penicillins Hives and Other (See Comments)    Did it involve swelling of the face/tongue/throat, SOB, or low BP? No Did it involve sudden or severe rash/hives, skin peeling, or any reaction on the inside of your mouth or nose?    #  #  YES  #  #  Did you need to seek medical attention at a hospital or doctor's office?    #  #  YES  #  #  When did it last happen?     2019     Theophyllines Other (See Comments)    HEADACHE Severe headaches   Theophylline Other (See Comments)    REACTION: headaches    Review of Systems: Review of Systems  Constitutional:  Negative for chills and fever.  HENT:         Denies rhinorrhea, nasal congestion, and post nasal drip  Respiratory:  Positive for cough and shortness of breath. Negative for wheezing.        Reports an occasional cough that started 2 to 3 days ago.  The cough can be productive with clear mucus.  She also  reports dyspnea with exertion.  Denies wheezing, tightness in chest, and nocturnal awakening due to breathing problems.  Cardiovascular:  Negative for chest pain and palpitations.       She reports that her heart beats fast at times and just recently started back this past month.  She also reports bilateral leg swelling that comes and goes.  She denies chest pain  Gastrointestinal:        Denies heartburn or reflux symptoms  Skin:  Negative for itching and rash.  Neurological:  Negative for headaches.  Endo/Heme/Allergies:  Positive for environmental allergies.     Physical Exam: BP 126/82   Pulse 84   Resp 16   SpO2 98%    Physical Exam Constitutional:      Appearance: Normal appearance.  HENT:     Head: Normocephalic and atraumatic.     Comments: Pharynx normal. Eyes normal. Ears normal. Nose normal    Right Ear: Tympanic membrane, ear canal and external ear normal.     Left Ear: Tympanic membrane, ear canal and external ear normal.     Nose: Nose normal.     Mouth/Throat:     Mouth: Mucous membranes are moist.     Pharynx: Oropharynx is clear.  Eyes:     Conjunctiva/sclera: Conjunctivae normal.  Cardiovascular:     Rate and Rhythm: Normal rate and regular rhythm.     Heart sounds: Normal heart sounds.  Pulmonary:     Effort: Pulmonary effort is normal.     Breath sounds: Normal breath sounds.     Comments: Lungs clear to auscultation Musculoskeletal:     Cervical back: Neck supple.  Skin:    General: Skin is warm.     Comments: Bilateral 2+ pitting edema of lower legs  Neurological:     Mental Status: She is alert and oriented to person, place, and time.  Psychiatric:        Mood and Affect: Mood normal.        Behavior: Behavior normal.        Thought Content: Thought content normal.        Judgment: Judgment normal.     Diagnostics: FVC 2.22 L (82%), FEV1 1.35 L (63%).  Spirometry indicates moderate airway obstruction.  Spirometry is improved from previous  spirometry.  Assessment and Plan: 1. Not well controlled severe persistent asthma   2. Dyspnea on exertion   3. Other allergic rhinitis   4. LPRD (laryngopharyngeal reflux disease)     No orders of the defined types were placed in this encounter.   Patient Instructions   1. Continue Tezepelumab injections  2. Continue Breztri - 2 inhalations 2 times per  day    3. Continue nasonex 1 spray each nostril 1-2 times per day.   4. Continue pantoprazole 40mg  1-2 times per day  5. Continue albuterol or DUONEB nebulization if needed.   6. Can add OTC antihistamine - Zyrtec / Allegra / Claritin, nasal saline if needed  7.Get CT chest without contrast - We will also get lab work for difficult to treat asthma -Schedule an appointment with your cardiologist to discuss dyspnea with exertion that does not respond to prednisone and albuterol/AirSupra  8. Return to clinic in 1-2 weeks or earlier if problem  Return in about 2 weeks (around 09/27/2022), or if symptoms worsen or fail to improve.    Thank you for the opportunity to care for this patient.  Please do not hesitate to contact me with questions.  Nehemiah Settle, FNP [facility]

## 2022-09-15 ENCOUNTER — Ambulatory Visit: Payer: BC Managed Care – PPO | Admitting: Allergy and Immunology

## 2022-09-16 ENCOUNTER — Telehealth: Payer: Self-pay

## 2022-09-16 NOTE — Telephone Encounter (Signed)
Chest CT has been scheduled at [facility]   Appointment date: 09/22/2022   Patient needs to check in at 4:15 pm  Authorization # : [id number]  Exp date: 10/12/2022   Left voicemail for patient to return call.

## 2022-09-18 LAB — ALPHA-1-ANTITRYPSIN: A-1 Antitrypsin: 153 mg/dL (ref 101–187)

## 2022-09-20 ENCOUNTER — Ambulatory Visit: Payer: BC Managed Care – PPO | Admitting: Allergy and Immunology

## 2022-09-20 LAB — ALPHA-1-ANTITRYPSIN PHENOTYP

## 2022-09-20 LAB — IGE: IgE (Immunoglobulin E), Serum: 8 IU/mL (ref 6–495)

## 2022-09-20 LAB — ANCA TITERS

## 2022-09-20 NOTE — Telephone Encounter (Signed)
Patient informed. 

## 2022-09-23 LAB — ASPERGILLUS PRECIPITINS
A.Fumigatus #1 Abs: NEGATIVE
Aspergillus Flavus Antibodies: NEGATIVE
Aspergillus Niger Antibodies: NEGATIVE
Aspergillus glaucus IgG: NEGATIVE
Aspergillus nidulans IgG: NEGATIVE
Aspergillus terreus IgG: NEGATIVE

## 2022-09-23 LAB — ALPHA-1-ANTITRYPSIN PHENOTYP: A-1 Antitrypsin: 165 mg/dL (ref 101–187)

## 2022-09-23 LAB — ANCA TITERS: P-ANCA: <1:20 {titer}

## 2022-09-24 NOTE — Progress Notes (Signed)
Please let Beth Rodriguez know that her results are back.  Alpha one antitrypsin level is normal. This rules out alpha one antitrypsin deficiency. This is one cause of difficult to control asthma.  ANCA titers are negative. This rules out Wegener's granulomatosis, which can present with difficult to control asthma,  Aspergillus titers: this is negative. This rules out something called allergic bronchopulmonary aspergillosis, which can present with difficult to control asthma.  IgE: normal

## 2022-09-27 ENCOUNTER — Encounter: Payer: Self-pay | Admitting: Allergy and Immunology

## 2022-09-27 ENCOUNTER — Ambulatory Visit: Payer: BC Managed Care – PPO | Admitting: Allergy and Immunology

## 2022-09-27 VITALS — BP 122/84 | HR 88 | Resp 18

## 2022-09-27 DIAGNOSIS — K219 Gastro-esophageal reflux disease without esophagitis: Secondary | ICD-10-CM | POA: Diagnosis not present

## 2022-09-27 DIAGNOSIS — J3089 Other allergic rhinitis: Secondary | ICD-10-CM

## 2022-09-27 DIAGNOSIS — R0609 Other forms of dyspnea: Secondary | ICD-10-CM

## 2022-09-27 DIAGNOSIS — J4550 Severe persistent asthma, uncomplicated: Secondary | ICD-10-CM | POA: Diagnosis not present

## 2022-09-27 NOTE — Patient Instructions (Addendum)
  1. Continue Tezepelumab injections  2. Continue Breztri - 2 inhalations 2 times per day    3. Continue nasonex 1 spray each nostril 1-2 times per day.   4. Continue pantoprazole 40mg  1-2 times per day  5. Continue albuterol or DUONEB nebulization if needed.   6. Can add OTC antihistamine - Zyrtec / Allegra / Claritin, nasal saline if needed  7. Return to clinic in 12 weeks or earlier if problem  8. Plan for fall flu vaccine

## 2022-09-27 NOTE — Progress Notes (Unsigned)
- [place] - [place] - [place] [facility] [place]   Follow-up Note  Referring Provider: Gordan Payment., MD Primary Provider: Gordan Payment., MD Date of Office Visit: 09/27/2022  Subjective:   Beth Rodriguez (DOB: [birth year]) is a 62 y.o. female who [facility] in re-evaluation of the following:  HPI: Beth Rodriguez returns to this clinic in evaluation of severe asthma, dyspnea on exertion, obesity, allergic rhinitis, LPR.  I last saw her in this clinic 12 July 2022 and she was seen by our nurse practitioner on 13 September 2022.  She has resolved her coughing.  She is a resolved her wheezing.  But she still gets very dyspneic when she walks.  She walks about 100 feet she usually has to stop and take a rest and then she can go back and walk another 100 feet and then take a stop and rest.  Recovery times about a minute.  She still has some positional vertigo.  She is working with her primary care doctor on this issue.  She has an appointment to see cardiology on 11 October 2023.  Allergies as of 09/27/2022       Reactions   Penicillins Hives, Other (See Comments)   Did it involve swelling of the face/tongue/throat, SOB, or low BP? No Did it involve sudden or severe rash/hives, skin peeling, or any reaction on the inside of your mouth or nose?    #  #  YES  #  #  Did you need to seek medical attention at a hospital or doctor's office?    #  #  YES  #  #  When did it last happen?     2019    Theophyllines Other (See Comments)   HEADACHE Severe headaches   Theophylline Other (See Comments)   REACTION: headaches        Medication List    albuterol (2.5 MG/3ML) 0.083% nebulizer solution Commonly known as: PROVENTIL Take 2.5 mg by nebulization every 6 (six) hours as needed for wheezing or shortness of breath.   albuterol 108 (90 Base) MCG/ACT inhaler Commonly known as: VENTOLIN HFA Inhale two puffs every 4-6 hours if needed.   aspirin EC  81 MG tablet Take 81 mg by mouth daily.   atorvastatin 10 MG tablet Commonly known as: LIPITOR Take 1 tablet (10 mg total) by mouth daily.   Breztri Aerosphere 160-9-4.8 MCG/ACT Aero Generic drug: Budeson-Glycopyrrol-Formoterol Inhale two puffs twice daily to prevent cough or wheeze.  Rinse, gargle, and spit after use.   fluticasone 50 MCG/ACT nasal spray Commonly known as: FLONASE Use one spray in each nostril one to two times daily as directed.   hydrochlorothiazide 12.5 MG capsule Commonly known as: MICROZIDE TAKE 1 CAPSULE BY MOUTH EVERY DAY   ibandronate 150 MG tablet Commonly known as: BONIVA Take 1 tablet by mouth every 28 (twenty-eight) days.   ipratropium-albuterol 0.5-2.5 (3) MG/3ML Soln Commonly known as: DUONEB Can use one vial in nebulizer every four to six hours as needed for cough or wheeze.   levothyroxine 88 MCG tablet Commonly known as: SYNTHROID Take 88 mcg by mouth daily before breakfast.   loratadine 10 MG tablet Commonly known as: CLARITIN Take 10 mg by mouth daily as needed for allergies. Reported on 07/21/2015   losartan 100 MG tablet Commonly known as: COZAAR Take 100 mg by mouth daily.   methocarbamol 500 MG tablet Commonly known as: ROBAXIN Take by mouth.  pantoprazole 40 MG tablet Commonly known as: PROTONIX Take 40 mg by mouth 2 (two) times daily.   Systane Complete 0.6 % Soln Generic drug: Propylene Glycol Place 1 drop into both eyes daily as needed (dry eyes).   Tezspire 210 MG/1. syringe Generic drug: tezepelumab-ekko Inject into the skin.    Past Medical History:  Diagnosis Date   Abnormal stress test 04/18/2017   Acquired hypothyroidism 07/18/2015   Last Assessment & Plan:  Formatting of this note might be different from the original. Relevant Hx: Course: Daily Update: Today's Plan:she is going to have her TSH updated for her   Electronically signed by: Krystal Clark, NP 07/22/15 2115   ADRENAL INSUFFICIENCY,  HX OF 01/23/2010   Qualifier: Diagnosis of  By: Peggyann Juba FNP, Melissa S    Allergic rhinitis 12/12/2014   Aortic atherosclerosis (HCC) 02/18/2017   Formatting of this note might be different from the original. Noted on CT scan ([facility]) 02/2017   Arthritis    Asthma    ASTHMA 01/16/2010   Qualifier: Diagnosis of  By: Peggyann Juba FNP, Melissa S    Cardiomegaly 01/16/2010   Qualifier: Diagnosis of  By: Peggyann Juba FNP, Melissa S    Carpal tunnel syndrome on right    Coronary artery disease 07/21/2017   With mildly abnormal stress test showing small defect involving apex indicating distal LAD disease  Formatting of this note might be different from the original. Overview:  With mildly abnormal stress test showing small defect involving apex indicating distal LAD disease   COUGH, CHRONIC 01/23/2010   Qualifier: Diagnosis of  By: Peggyann Juba FNP, Melissa S    DEEP VENOUS THROMBOPHLEBITIS, LEG, RIGHT, HX OF 01/16/2010   Qualifier: Diagnosis of  By: Peggyann Juba FNP, Melissa S    Degeneration of lumbar intervertebral disc 07/18/2015   Last Assessment & Plan:  Formatting of this note might be different from the original. Relevant Hx: Course: Daily Update: Today's Plan:longstanding for her and she is still working and on her feet some days being harder for her than others, she is to again work on her weight to help with this  Electronically signed by: Krystal Clark, NP 07/22/15 2113   DIASTOLIC DYSFUNCTION 01/29/2010   Annotation: grade 2 per 2-D echo 01/26/10 Qualifier: Diagnosis of  By: Peggyann Juba FNP, Melissa S    Diverticulosis 10/03/2017   DYSPNEA ON EXERTION 01/16/2010   Qualifier: Diagnosis of  By: Peggyann Juba FNP, Melissa S    Dyspnea on exertion 03/17/2017   Edema 01/16/2010   Qualifier: Diagnosis of  By: Peggyann Juba FNP, Melissa S    Embolism - blood clot 2007   right leg hx of   Enlarged heart    Essential hypertension 01/29/2010   Qualifier: Diagnosis of  By: Yetta Barre RN, Crystal     Last Assessment & Plan:  Formatting of this note might be different from the original. Relevant Hx: Course: Daily Update: Today's Plan:stable for her and will follow this for her  Electronically signed by: Krystal Clark, NP 07/22/15 2113   Gastroesophageal reflux disease without esophagitis 07/18/2015   Last Assessment & Plan:  Formatting of this note might be different from the original. Relevant Hx: Course: Daily Update: Today's Plan:this is stable for her at this time and will follow her  Electronically signed by: Krystal Clark, NP 07/22/15 2116   GERD (gastroesophageal reflux disease)    GERD, SEVERE 02/01/2010   Qualifier: Diagnosis of  By: Delford Field MD, Charlcie Cradle    Hypertension  Hypothyroidism    HYPOTHYROIDISM 01/16/2010   Qualifier: Diagnosis of  By: Peggyann Juba FNP, Melissa S    LIPOMA 05/15/2010   Qualifier: Diagnosis of  By: Peggyann Juba FNP, Melissa S    Lumbar spondylosis 02/18/2017   Formatting of this note might be different from the original. Noted on ([facility]) CT scan 02/2017   Mixed hyperlipidemia 07/18/2015   Last Assessment & Plan:  Formatting of this note might be different from the original. Relevant Hx: Course: Daily Update: Today's Plan:she is going to have her lipids updated fasting  Electronically signed by: Krystal Clark, NP 07/22/15 2112   OBESITY, MORBID 01/16/2010   Qualifier: Diagnosis of  By: Peggyann Juba FNP, Katrinka Blazing   Formatting of this note might be different from the original. Overview:  Qualifier: Diagnosis of  By: Peggyann Juba FNP, Melissa S   Obstructive sleep apnea syndrome 01/16/2010   last sleep study over five years Formatting of this note might be different from the original. Overview:  Qualifier: Diagnosis of  By: Peggyann Juba FNP, Katrinka Blazing  Last Assessment & Plan:  Formatting of this note might be different from the original. Relevant Hx: Course: Daily Update: Today's Plan:she cannot wear the mask for this and have  discussed with her how this can affect her by not doing so     Osteopenia of multiple sites 06/02/2017   Formatting of this note might be different from the original. -2.3   Other acute sinusitis 01/29/2010   Qualifier: Diagnosis of  By: Delford Field MD, Charlcie Cradle    Prediabetes 07/22/2015   Last Assessment & Plan:  Formatting of this note might be different from the original. Relevant Hx: Course: Daily Update: Today's Plan:would update her HGBA1C for her and with her use of the steroids this can be elevated for her  Electronically signed by: Krystal Clark, NP 07/22/15 2109   Right rotator cuff tear 08/09/2014   Severe persistent asthma 01/09/2015   Courtni presents today with a one week history of wheezing and DOE and slight cough requiring her to use a SABA a few times per day. In addition, has nasal congestion and green rhinorrhea. She has been off her prednisone for over two months and was doing very well until this flare up. She continue on her Nucala and her large collection of medication for inflammation and reflux. Her reflux is under c   Shortness of breath dyspnea    with exertion   Sleep apnea    last sleep study over five years   SLEEP APNEA, OBSTRUCTIVE 01/16/2010   Qualifier: Diagnosis of  By: Peggyann Juba FNP, Melissa S    Thyroid disease    hypothyroidism   Vitamin D deficiency 02/02/2010   Qualifier: Diagnosis of  By: Peggyann Juba FNP, Katrinka Blazing   Formatting of this note might be different from the original. Overview:  Qualifier: Diagnosis of  By: Peggyann Juba FNP, Katrinka Blazing    Past Surgical History:  Procedure Laterality Date   CARPAL TUNNEL RELEASE Right    EYE SURGERY Bilateral    cataract removal    KNEE SURGERY  2007   right knee   LIPOMA EXCISION Right 03/28/2019   Procedure: EXCISION SUBCUTANEOUS LIPOMA RIGHT SHOULDER;  Surgeon: Manus Rudd, MD;  Location: [facility] OR;  Service: General;  Laterality: Right;   SHOULDER ARTHROSCOPY WITH OPEN ROTATOR CUFF REPAIR AND DISTAL  CLAVICLE ACROMINECTOMY Right 08/09/2014   Procedure: SHOULDER ARTHROSCOPY WITH OPEN ROTATOR CUFF REPAIR AND DISTAL CLAVICLE ACROMINECTOMY;  Surgeon: Frederico Hamman, MD;  Location: [facility] OR;  Service: Orthopedics;  Laterality: Right;   SINUS SURGERY WITH INSTATRAK     ???    Review of systems negative except as noted in HPI / PMHx or noted below:  Review of Systems  Constitutional: Negative.   HENT: Negative.    Eyes: Negative.   Respiratory: Negative.    Cardiovascular: Negative.   Gastrointestinal: Negative.   Genitourinary: Negative.   Musculoskeletal: Negative.   Skin: Negative.   Neurological: Negative.   Endo/Heme/Allergies: Negative.   Psychiatric/Behavioral: Negative.       Objective:   Vitals:   09/27/22 1655  BP: 122/84  Pulse: 88  Resp: 18  SpO2: 95%          Physical Exam Constitutional:      Appearance: She is not diaphoretic.  HENT:     Head: Normocephalic.     Right Ear: Tympanic membrane, ear canal and external ear normal.     Left Ear: Tympanic membrane, ear canal and external ear normal.     Nose: Nose normal. No mucosal edema or rhinorrhea.     Mouth/Throat:     Pharynx: Uvula midline. No oropharyngeal exudate.  Eyes:     Conjunctiva/sclera: Conjunctivae normal.  Neck:     Thyroid: No thyromegaly.     Trachea: Trachea normal. No tracheal tenderness or tracheal deviation.  Cardiovascular:     Rate and Rhythm: Normal rate and regular rhythm.     Heart sounds: Normal heart sounds, S1 normal and S2 normal. No murmur heard. Pulmonary:     Effort: No respiratory distress.     Breath sounds: Normal breath sounds. No stridor. No wheezing or rales.  Lymphadenopathy:     Head:     Right side of head: No tonsillar adenopathy.     Left side of head: No tonsillar adenopathy.     Cervical: No cervical adenopathy.  Skin:    Findings: No erythema or rash.     Nails: There is no clubbing.  Neurological:     Mental Status: She is alert.      Diagnostics:    Spirometry was performed and demonstrated an FEV1 of 1.63 at 77 % of predicted.  Results of a chest CT scan obtained 22 September 2022 identifies  Assessment and Plan:   No diagnosis found.  Patient Instructions   1. Continue Tezepelumab injections  2. Continue Breztri - 2 inhalations 2 times per day    3. Continue nasonex 1 spray each nostril 1-2 times per day.   4. Continue pantoprazole 40mg  1-2 times per day  5. Continue albuterol or DUONEB nebulization if needed.   6. Can add OTC antihistamine - Zyrtec / Allegra / Claritin, nasal saline if needed  7. Return to clinic in 12 weeks or earlier if problem  8. Plan for fall flu vaccine  Laurette Schimke, MD Allergy / Immunology [facility]

## 2022-09-28 ENCOUNTER — Encounter: Payer: Self-pay | Admitting: Allergy and Immunology

## 2022-09-29 ENCOUNTER — Ambulatory Visit (INDEPENDENT_AMBULATORY_CARE_PROVIDER_SITE_OTHER): Payer: BC Managed Care – PPO | Admitting: *Deleted

## 2022-09-29 ENCOUNTER — Telehealth: Payer: Self-pay

## 2022-09-29 DIAGNOSIS — J4550 Severe persistent asthma, uncomplicated: Secondary | ICD-10-CM

## 2022-09-29 NOTE — Telephone Encounter (Signed)
Received ct results from  imaging Dr Marlynn Perking interrupted them results was normal lm for pt to call us back about results

## 2022-09-29 NOTE — Telephone Encounter (Signed)
Informed Aloha that her scan was normal.

## 2022-10-01 DIAGNOSIS — K5901 Slow transit constipation: Secondary | ICD-10-CM | POA: Insufficient documentation

## 2022-10-10 NOTE — Progress Notes (Signed)
Cardiology Office Note:  .   Date:  10/11/2022  ID:  Beth Rodriguez, DOB 05/13/1960, MRN [id number] PCP: Gordan Payment., MD  [facility] Providers Cardiologist:  None    History of Present Illness: Beth Rodriguez is a 62 y.o. female with a past medical history of hypertension, CAD, congestive heart failure, OSA, GERD, hypothyroidism, history of DVT, hyperlipidemia, BMI greater than 40.  12/14/2021 monitor heart rate range of 49 to 210 bpm with an average heart rate of 75 bpm, predominant underlying rhythm was sinus, first-degree AV block was present, BBB/IVCD was present, 103 SVT runs occurred.  She was started on metoprolol 25 mg twice daily. 12/08/2021 coronary calcium score of 26, placing her in the 76 percentile. 01/19/2021 echocardiogram EF 60 to 65%, mild concentric LVH, grade 2 DD, trivial MR. 2018 stress test revealed small area of ischemia however she did not want to proceed with cardiac catheterization as she was asymptomatic.  Most recently evaluated by Dr. Bing Matter on 05/19/2022, she was well from a cardiac perspective and offers no complaints.  Presented today for a follow-up appointment for worsening shortness of breath.  She was evaluated by her PCP on several occasions, CT of the chest was normal, chest x-ray was normal.  She was most recently started on Tezspire, and noted over the last week her SOB was improved, however she kept the appointment today for a general follow up.  She offers no formal complaints today.  She is working at the [employer], hopeful to transition to 12-hour shifts in the next few weeks. She denies chest pain, palpitations, dyspnea, pnd, orthopnea, n, v, dizziness, syncope, edema, weight gain, or early satiety.   CMET on 10/05/2022 sodium 141, potassium 3.4, creatinine 1.36, GFR 44, normal LFTs, LDL 69 TSH normal, CBC normal  ROS: Review of Systems  Constitutional: Negative.   HENT: Negative.    Eyes: Negative.   Respiratory: Negative.     Cardiovascular:  Positive for palpitations.  Gastrointestinal: Negative.   Genitourinary: Negative.   Musculoskeletal: Negative.   Skin: Negative.   Neurological: Negative.   Endo/Heme/Allergies: Negative.   Psychiatric/Behavioral: Negative.       Studies Reviewed: Marland Kitchen   EKG Interpretation Date/Time:  Monday October 11 2022 11:41:29 EDT Ventricular Rate:  75 PR Interval:  198 QRS Duration:  106 QT Interval:  388 QTC Calculation: 433 R Axis:   -26  Text Interpretation: Sinus rhythm with Premature atrial complexes Low voltage QRS Incomplete right bundle branch block Cannot rule out Anterior infarct , age undetermined Abnormal ECG When compared with ECG of 28-Mar-2019 09:42, Premature atrial complexes are now Present Confirmed by Wallis Bamberg [phone]) on 10/11/2022 12:36:29 PM    Cardiac Studies & Procedures     STRESS TESTS  MYOCARDIAL PERFUSION IMAGING 05/05/2017   ECHOCARDIOGRAM  ECHOCARDIOGRAM COMPLETE 01/19/2021  Narrative ECHOCARDIOGRAM REPORT    Patient Name:   Beth Rodriguez Date of Exam: 01/19/2021 Medical Rec #:  [id number]      Height:       61.0 in Accession #:    [id number]     Weight:       290.6 lb Date of Birth:  1960/11/14      BSA:          2.214 m Patient Age:    60 years       BP:           114/72 mmHg Patient Gender: F  HR:           77 bpm. Exam Location:  [place]  Procedure: 2D Echo, Cardiac Doppler, Color Doppler and Strain Analysis  Indications:    Dyspnea R06.00  History:        Patient has prior history of Echocardiogram examinations, most recent 03/30/2017. Cardiomegaly, CAD, Signs/Symptoms:Severe obesity; Risk Factors:Hypertension.  Sonographer:    Louie Boston RDCS Referring Phys: [phone] Marveen Reeks KRASOWSKI  IMPRESSIONS   1. Left ventricular ejection fraction, by estimation, is 60 to 65%. The left ventricle has normal function. The left ventricle has no regional wall motion abnormalities. There is mild concentric left  ventricular hypertrophy. Left ventricular diastolic parameters are consistent with Grade II diastolic dysfunction (pseudonormalization). Elevated left atrial pressure. 2. Right ventricular systolic function is normal. The right ventricular size is normal. There is normal pulmonary artery systolic pressure. 3. The mitral valve is normal in structure. Trivial mitral valve regurgitation. No evidence of mitral stenosis. 4. The aortic valve is tricuspid. Aortic valve regurgitation is not visualized. No aortic stenosis is present. 5. The inferior vena cava is normal in size with greater than 50% respiratory variability, suggesting right atrial pressure of 3 mmHg.  FINDINGS Left Ventricle: Left ventricular ejection fraction, by estimation, is 60 to 65%. The left ventricle has normal function. The left ventricle has no regional wall motion abnormalities. The left ventricular internal cavity size was normal in size. There is mild concentric left ventricular hypertrophy. Left ventricular diastolic parameters are consistent with Grade II diastolic dysfunction (pseudonormalization). Elevated left atrial pressure.  Right Ventricle: The right ventricular size is normal. No increase in right ventricular wall thickness. Right ventricular systolic function is normal. There is normal pulmonary artery systolic pressure. The tricuspid regurgitant velocity is 2.14 m/s, and with an assumed right atrial pressure of 3 mmHg, the estimated right ventricular systolic pressure is 21.3 mmHg.  Left Atrium: Left atrial size was normal in size.  Right Atrium: Right atrial size was normal in size.  Pericardium: There is no evidence of pericardial effusion.  Mitral Valve: The mitral valve is normal in structure. Trivial mitral valve regurgitation. No evidence of mitral valve stenosis.  Tricuspid Valve: The tricuspid valve is normal in structure. Tricuspid valve regurgitation is trivial. No evidence of tricuspid  stenosis.  Aortic Valve: The aortic valve is tricuspid. Aortic valve regurgitation is not visualized. No aortic stenosis is present.  Pulmonic Valve: The pulmonic valve was normal in structure. Pulmonic valve regurgitation is not visualized. No evidence of pulmonic stenosis.  Aorta: The aortic root, ascending aorta, aortic arch and descending aorta are all structurally normal, with no evidence of dilitation or obstruction.  Venous: The pulmonary veins were not well visualized. The inferior vena cava is normal in size with greater than 50% respiratory variability, suggesting right atrial pressure of 3 mmHg.  IAS/Shunts: No atrial level shunt detected by color flow Doppler.   LEFT VENTRICLE PLAX 2D LVIDd:         5.15 cm   Diastology LVIDs:         3.10 cm   LV e' medial:    5.98 cm/s LV PW:         1.20 cm   LV E/e' medial:  15.5 LV IVS:        1.20 cm   LV e' lateral:   9.14 cm/s LVOT diam:     2.20 cm   LV E/e' lateral: 10.1 LV SV:         95  LV SV Index:   43 LVOT Area:     3.80 cm   RIGHT VENTRICLE             IVC RV S prime:     11.60 cm/s  IVC diam: 1.90 cm TAPSE (M-mode): 3.3 cm  LEFT ATRIUM           Index        RIGHT ATRIUM           Index LA diam:      4.60 cm 2.08 cm/m   RA Area:     17.60 cm LA Vol (A2C): 48.5 ml 21.91 ml/m  RA Volume:   48.30 ml  21.82 ml/m LA Vol (A4C): 55.3 ml 24.98 ml/m AORTIC VALVE LVOT Vmax:   131.00 cm/s LVOT Vmean:  87.400 cm/s LVOT VTI:    0.251 m  AORTA Ao Root diam: 3.40 cm Ao Asc diam:  3.50 cm Ao Desc diam: 2.10 cm  MITRAL VALVE               TRICUSPID VALVE MV Area (PHT): 3.49 cm    TR Peak grad:   18.3 mmHg MV Decel Time: 218 msec    TR Vmax:        214.00 cm/s MV E velocity: 92.55 cm/s MV A velocity: 58.90 cm/s  SHUNTS MV E/A ratio:  1.57        Systemic VTI:  0.25 m Systemic Diam: 2.20 cm  Norman Herrlich MD Electronically signed by Norman Herrlich MD Signature Date/Time: 01/19/2021/12:02:02 PM    Final     MONITORS  LONG TERM MONITOR (3-14 DAYS) 12/14/2021  Narrative Patch Wear Time:  6 days and 15 hours (2023-08-18T09:00:32-0400 to 2023-08-25T00:06:51-0400)  Patient had a min HR of 49 bpm, max HR of 210 bpm, and avg HR of 75 bpm. Predominant underlying rhythm was Sinus Rhythm. First Degree AV Block was present. Bundle Branch Block/IVCD was present. 103 Supraventricular Tachycardia runs occurred, the run with the fastest interval lasting 5 beats with a max rate of 210 bpm, the longest lasting 38.4 secs with an avg rate of 133 bpm. Supraventricular Tachycardia was detected within +/- 45 seconds of symptomatic patient event(s). Isolated SVEs were occasional (2.8%, 19952), SVE Couplets were rare (<1.0%, 147), and SVE Triplets were rare (<1.0%, 69). Isolated VEs were rare (<1.0%), VE Couplets were rare (<1.0%), and no VE Triplets were present. Ventricular Bigeminy was present.  Summary and conclusions: 103 episode of supraventricular tachycardia longest episode 38.4 seconds at rate of 133.  Symptomatic.   CT SCANS  CT CARDIAC SCORING (SELF PAY ONLY) 12/08/2021  Addendum 12/08/2021  6:17 PM ADDENDUM REPORT: 12/08/2021 18:15  ADDENDUM: CLINICAL DATA:  Cardiovascular Disease Risk stratification  EXAM:  Coronary Calcium Score  TECHNIQUE:  A gated, non-contrast computed tomography scan of the heart was  performed using 3mm slice thickness. Axial images were analyzed on a  dedicated workstation. Calcium scoring of the coronary arteries was  performed using the Agatston method.  FINDINGS:  Coronary Calcium Score:  Left main: 0  Left anterior descending artery: 26.2  Left circumflex artery: 0  Right coronary artery: 0  Total: 26.2  Percentile: 0  Pericardium: Normal.  Ascending Aorta: Upper limit of normal - 39 mm.  Pulmonary artery: Normal caliber  Non-cardiac: See separate report from [facility].  IMPRESSION: IMPRESSION  Coronary calcium score of 16.2 -  LAD. This was 25 percentile for age, race, and sex-matched controls.  RECOMMENDATIONS:  Coronary artery calcium (  CAC) score is a strong predictor of  incident coronary heart disease (CHD) and provides predictive  information beyond traditional risk factors. CAC scoring is  reasonable to use in the decision to withhold, postpone, or initiate  statin therapy in intermediate-risk or selected borderline-risk  asymptomatic adults (age 73-75 years and LDL-C >=70 to <190 mg/dL)  who do not have diabetes or established atherosclerotic  cardiovascular disease (ASCVD).* In intermediate-risk (10-year ASCVD  risk >=7.5% to <20%) adults or selected borderline-risk (10-year  ASCVD risk >=5% to <7.5%) adults in whom a CAC score is measured for  the purpose of making a treatment decision the following  recommendations have been made:  If CAC=0, it is reasonable to withhold statin therapy and reassess  in 5 to 10 years, as long as higher risk conditions are absent  (diabetes mellitus, family history of premature CHD in first degree  relatives (males <55 years; females <65 years), cigarette smoking,  or LDL >=190 mg/dL).  If CAC is 1 to 99, it is reasonable to initiate statin therapy for  patients >=42 years of age.  If CAC is >=100 or >=75th percentile, it is reasonable to initiate  statin therapy at any age.  Cardiology referral should be considered for patients with CAC  scores >=400 or >=75th percentile.  *2018 AHA/ACC/AACVPR/AAPA/ABC/ACPM/ADA/AGS/APhA/ASPC/NLA/PCNA  Guideline on the Management of Blood Cholesterol: A Report of the  American College of Cardiology/American Heart Association Task Force  on Clinical Practice Guidelines. J Am Coll Cardiol.  2019;73(24):3168-3209.   Electronically Signed By: Gypsy Balsam M.D. On: 12/08/2021 18:15  Narrative EXAM: OVER-READ INTERPRETATION  CT CHEST  The following report is an over-read performed by radiologist  Dr. Maryelizabeth Rowan [facility] on 12/08/2021. This over-read does not include interpretation of cardiac or coronary anatomy or pathology. The calcium score interpretation by the cardiologist is attached.  COMPARISON:  None.  FINDINGS: Limited view of the lung parenchyma demonstrates no suspicious nodularity. Airways are normal.  Limited view of the mediastinum demonstrates no adenopathy. Esophagus normal.  Limited view of the upper abdomen unremarkable.  Limited view of the skeleton and chest wall is unremarkable.  IMPRESSION: No significant extracardiac findings.  Electronically Signed: By: Genevive Bi M.D. On: 12/08/2021 17:28          Risk Assessment/Calculations:             Physical Exam:   VS:  BP 130/80 (BP Location: Right Arm, Patient Position: Sitting, Cuff Size: Normal)   Pulse 75   Ht 5\' 1"  (1.549 m)   Wt 299 lb (135.6 kg)   SpO2 94%   BMI 56.50 kg/m    Wt Readings from Last 3 Encounters:  10/11/22 299 lb (135.6 kg)  06/17/22 297 lb 12.8 oz (135.1 kg)  05/19/22 (!) 301 lb (136.5 kg)    GEN: Well nourished, well developed in no acute distress NECK: No JVD; No carotid bruits CARDIAC: RRR, no murmurs, rubs, gallops RESPIRATORY:  Clear to auscultation without rales, wheezing or rhonchi  ABDOMEN: Soft, non-tender, non-distended EXTREMITIES:  No edema; No deformity   ASSESSMENT AND PLAN: .   Nonobstructive CAD- coronary calcium score of 26 in 2023; Stable with no anginal symptoms. No indication for ischemic evaluation.  Continue aspirin 81 mg daily, continue Lipitor 10 mg daily  Shortness of breath-shortness of breath is better today since she was started on Tezspire, and feels like her shortness of breath is improved.  HFpEF-most recent echo revealed a preserved EF, grade 2 DD.  We did discuss SGLT2 inhibitor however she has a history of frequent UTIs and yeast infections.  Her breathing feels better right now so she wants to hold off  on any further treatment at this time.  Hyperlipidemia-this is managed by her PCP, records are not available for review, continue atorvastatin.    Palpitations-  monitor revealed a heart rate range of 49 to 210 bpm with an average heart rate of 75 bpm, predominant underlying rhythm was sinus, first-degree AV block was present, BBB/IVCD was present, 103 SVT runs occurred was previously on metoprolol however she was unable to tolerate this, they are not bothersome to her at this time.    Dispo: Return in 6 months.  Signed, Flossie Dibble, NP

## 2022-10-11 ENCOUNTER — Ambulatory Visit: Payer: BC Managed Care – PPO | Attending: Cardiology | Admitting: Cardiology

## 2022-10-11 ENCOUNTER — Encounter: Payer: Self-pay | Admitting: Cardiology

## 2022-10-11 VITALS — BP 130/80 | HR 75 | Ht 61.0 in | Wt 299.0 lb

## 2022-10-11 DIAGNOSIS — R609 Edema, unspecified: Secondary | ICD-10-CM

## 2022-10-11 DIAGNOSIS — K5901 Slow transit constipation: Secondary | ICD-10-CM

## 2022-10-11 DIAGNOSIS — G5601 Carpal tunnel syndrome, right upper limb: Secondary | ICD-10-CM

## 2022-10-11 DIAGNOSIS — K219 Gastro-esophageal reflux disease without esophagitis: Secondary | ICD-10-CM

## 2022-10-11 DIAGNOSIS — M1990 Unspecified osteoarthritis, unspecified site: Secondary | ICD-10-CM

## 2022-10-11 DIAGNOSIS — E039 Hypothyroidism, unspecified: Secondary | ICD-10-CM | POA: Diagnosis not present

## 2022-10-11 DIAGNOSIS — E559 Vitamin D deficiency, unspecified: Secondary | ICD-10-CM

## 2022-10-11 DIAGNOSIS — I2089 Other forms of angina pectoris: Secondary | ICD-10-CM

## 2022-10-11 DIAGNOSIS — R9439 Abnormal result of other cardiovascular function study: Secondary | ICD-10-CM

## 2022-10-11 DIAGNOSIS — J309 Allergic rhinitis, unspecified: Secondary | ICD-10-CM

## 2022-10-11 DIAGNOSIS — I159 Secondary hypertension, unspecified: Secondary | ICD-10-CM

## 2022-10-11 DIAGNOSIS — J45909 Unspecified asthma, uncomplicated: Secondary | ICD-10-CM

## 2022-10-11 DIAGNOSIS — I10 Essential (primary) hypertension: Secondary | ICD-10-CM

## 2022-10-11 DIAGNOSIS — M5136 Other intervertebral disc degeneration, lumbar region: Secondary | ICD-10-CM

## 2022-10-11 DIAGNOSIS — M8589 Other specified disorders of bone density and structure, multiple sites: Secondary | ICD-10-CM

## 2022-10-11 DIAGNOSIS — I5030 Unspecified diastolic (congestive) heart failure: Secondary | ICD-10-CM

## 2022-10-11 DIAGNOSIS — R0609 Other forms of dyspnea: Secondary | ICD-10-CM

## 2022-10-11 DIAGNOSIS — I519 Heart disease, unspecified: Secondary | ICD-10-CM

## 2022-10-11 DIAGNOSIS — I25119 Atherosclerotic heart disease of native coronary artery with unspecified angina pectoris: Secondary | ICD-10-CM

## 2022-10-11 DIAGNOSIS — G8929 Other chronic pain: Secondary | ICD-10-CM | POA: Insufficient documentation

## 2022-10-11 DIAGNOSIS — E782 Mixed hyperlipidemia: Secondary | ICD-10-CM | POA: Diagnosis not present

## 2022-10-11 DIAGNOSIS — Z8672 Personal history of thrombophlebitis: Secondary | ICD-10-CM

## 2022-10-11 DIAGNOSIS — Z79899 Other long term (current) drug therapy: Secondary | ICD-10-CM

## 2022-10-11 DIAGNOSIS — R059 Cough, unspecified: Secondary | ICD-10-CM

## 2022-10-11 DIAGNOSIS — I2510 Atherosclerotic heart disease of native coronary artery without angina pectoris: Secondary | ICD-10-CM

## 2022-10-11 DIAGNOSIS — I517 Cardiomegaly: Secondary | ICD-10-CM

## 2022-10-11 DIAGNOSIS — R002 Palpitations: Secondary | ICD-10-CM | POA: Diagnosis not present

## 2022-10-11 DIAGNOSIS — E079 Disorder of thyroid, unspecified: Secondary | ICD-10-CM

## 2022-10-11 DIAGNOSIS — N281 Cyst of kidney, acquired: Secondary | ICD-10-CM

## 2022-10-11 DIAGNOSIS — M47816 Spondylosis without myelopathy or radiculopathy, lumbar region: Secondary | ICD-10-CM

## 2022-10-11 DIAGNOSIS — J018 Other acute sinusitis: Secondary | ICD-10-CM

## 2022-10-11 DIAGNOSIS — K5790 Diverticulosis of intestine, part unspecified, without perforation or abscess without bleeding: Secondary | ICD-10-CM

## 2022-10-11 DIAGNOSIS — R7303 Prediabetes: Secondary | ICD-10-CM

## 2022-10-11 DIAGNOSIS — B070 Plantar wart: Secondary | ICD-10-CM

## 2022-10-11 DIAGNOSIS — G4733 Obstructive sleep apnea (adult) (pediatric): Secondary | ICD-10-CM

## 2022-10-11 DIAGNOSIS — Z780 Asymptomatic menopausal state: Secondary | ICD-10-CM

## 2022-10-11 DIAGNOSIS — J4550 Severe persistent asthma, uncomplicated: Secondary | ICD-10-CM

## 2022-10-11 DIAGNOSIS — I700 Atherosclerosis of aorta: Secondary | ICD-10-CM

## 2022-10-11 NOTE — Patient Instructions (Signed)
Medication Instructions:  Your physician recommends that you continue on your current medications as directed. Please refer to the Current Medication list given to you today.  *If you need a refill on your cardiac medications before your next appointment, please call your pharmacy*   Lab Work: None If you have labs (blood work) drawn today and your tests are completely normal, you will receive your results only by: MyChart Message (if you have MyChart) OR A paper copy in the mail If you have any lab test that is abnormal or we need to change your treatment, we will call you to review the results.   Testing/Procedures: None   Follow-Up: At [facility], you and your health needs are our priority.  As part of our continuing mission to provide you with exceptional heart care, we have created designated Provider Care Teams.  These Care Teams include your primary Cardiologist (physician) and Advanced Practice Providers (APPs -  Physician Assistants and Nurse Practitioners) who all work together to provide you with the care you need, when you need it.  We recommend signing up for the patient portal called "MyChart".  Sign up information is provided on this After Visit Summary.  MyChart is used to connect with patients for Virtual Visits (Telemedicine).  Patients are able to view lab/test results, encounter notes, upcoming appointments, etc.  Non-urgent messages can be sent to your provider as well.   To learn more about what you can do with MyChart, go to [email or web address].    Your next appointment:   6 month(s)  Provider:   Jennifer Woody, NP ([place])    Other Instructions None  

## 2022-10-22 DIAGNOSIS — M170 Bilateral primary osteoarthritis of knee: Secondary | ICD-10-CM | POA: Insufficient documentation

## 2022-10-27 ENCOUNTER — Ambulatory Visit (INDEPENDENT_AMBULATORY_CARE_PROVIDER_SITE_OTHER): Payer: BC Managed Care – PPO | Admitting: *Deleted

## 2022-10-27 DIAGNOSIS — J4550 Severe persistent asthma, uncomplicated: Secondary | ICD-10-CM

## 2022-11-05 ENCOUNTER — Other Ambulatory Visit: Payer: Self-pay | Admitting: Allergy and Immunology

## 2022-11-25 ENCOUNTER — Ambulatory Visit (INDEPENDENT_AMBULATORY_CARE_PROVIDER_SITE_OTHER): Payer: BC Managed Care – PPO | Admitting: *Deleted

## 2022-11-25 DIAGNOSIS — J4550 Severe persistent asthma, uncomplicated: Secondary | ICD-10-CM

## 2022-12-23 ENCOUNTER — Ambulatory Visit (INDEPENDENT_AMBULATORY_CARE_PROVIDER_SITE_OTHER): Payer: BC Managed Care – PPO | Admitting: *Deleted

## 2022-12-23 DIAGNOSIS — J4550 Severe persistent asthma, uncomplicated: Secondary | ICD-10-CM | POA: Diagnosis not present

## 2023-01-14 ENCOUNTER — Other Ambulatory Visit: Payer: Self-pay | Admitting: Allergy and Immunology

## 2023-01-18 ENCOUNTER — Ambulatory Visit: Payer: BC Managed Care – PPO

## 2023-01-18 DIAGNOSIS — J4550 Severe persistent asthma, uncomplicated: Secondary | ICD-10-CM | POA: Diagnosis not present

## 2023-02-15 ENCOUNTER — Ambulatory Visit (INDEPENDENT_AMBULATORY_CARE_PROVIDER_SITE_OTHER): Payer: BC Managed Care – PPO | Admitting: *Deleted

## 2023-02-15 DIAGNOSIS — J4550 Severe persistent asthma, uncomplicated: Secondary | ICD-10-CM | POA: Diagnosis not present

## 2023-03-15 ENCOUNTER — Ambulatory Visit: Payer: BC Managed Care – PPO

## 2023-03-16 ENCOUNTER — Ambulatory Visit (INDEPENDENT_AMBULATORY_CARE_PROVIDER_SITE_OTHER): Payer: BC Managed Care – PPO | Admitting: *Deleted

## 2023-03-16 DIAGNOSIS — J4550 Severe persistent asthma, uncomplicated: Secondary | ICD-10-CM

## 2023-04-05 ENCOUNTER — Encounter: Payer: Self-pay | Admitting: Allergy

## 2023-04-05 ENCOUNTER — Ambulatory Visit: Payer: BC Managed Care – PPO | Admitting: Allergy

## 2023-04-05 VITALS — BP 132/80 | HR 82 | Temp 98.6°F | Resp 18

## 2023-04-05 DIAGNOSIS — J3089 Other allergic rhinitis: Secondary | ICD-10-CM

## 2023-04-05 DIAGNOSIS — J4551 Severe persistent asthma with (acute) exacerbation: Secondary | ICD-10-CM

## 2023-04-05 DIAGNOSIS — M778 Other enthesopathies, not elsewhere classified: Secondary | ICD-10-CM | POA: Insufficient documentation

## 2023-04-05 DIAGNOSIS — K219 Gastro-esophageal reflux disease without esophagitis: Secondary | ICD-10-CM

## 2023-04-05 DIAGNOSIS — L600 Ingrowing nail: Secondary | ICD-10-CM | POA: Insufficient documentation

## 2023-04-05 MED ORDER — AZITHROMYCIN 500 MG PO TABS: ORAL_TABLET | ORAL | 0 refills | Status: DC

## 2023-04-05 NOTE — Patient Instructions (Addendum)
  1. Continue Tezepelumab injections  2. Continue Breztri - 2 inhalations 2 times per day    3. Continue nasonex 1 spray each nostril 1-2 times per day.   4. Continue pantoprazole 40mg  1-2 times per day  5. For current symptoms:  A.  Azithromycin 500mg  daily x 3 days  B.  Prednisone 20mg  twice a day x 5 days  C.  Today use Albuterol pump or nebulizer every 4 hours while awake (if sleeping do not wake for treatment) then resume as needed use tomorrow  D.  If having symptoms (coughing, wheeze, shortness of breath or chest tightness) use DUONEB every 6 hours as needed  6. Can add OTC antihistamine - Zyrtec / Allegra / Claritin, nasal saline if needed  7. Return to clinic in 12 weeks or earlier if problem

## 2023-04-05 NOTE — Progress Notes (Signed)
Follow-up Note  RE: Beth Rodriguez Beth Rodriguez Beth Rodriguez Date of Office Visit: 04/05/2023   History of present illness: Beth Rodriguez is a 62 y.o. female presenting today for sick visit today.  She has history of severe persistent asthma, allergic rhinitis and LPRD.  She was last seen in the office on 09/27/2022 Dr. Lucie Leather.  She presents today with her husband.  Discussed the use of AI scribe software for clinical note transcription with the patient, who gave verbal consent to proceed.    She presents with an exacerbation of symptoms. She reports an increase in coughing, production of green thick phlegm, wheezing, shortness of breath. These symptoms began after feeling unwell at work several days ago and have persisted.  She states a coworker is sick that she believes she may have gotten exposed to this.  The patient notes shortness of breath, particularly when active, but denies any fever or chills. She has been using her rescue medications, including an albuterol pump, approximately every four hours, and has a nebulizer at home that she has not used yet for symptoms.  The patient is also on a regimen of Tezspre injections and is due next week, Nasonex, and Pantoprazole. She received a flu vaccine earlier this year. The patient denies any new medications or antihistamine use at this time.    Review of systems: 10pt ROS negative unless noted above in HPI  All other systems negative unless noted above in HPI  Past medical/social/surgical/family history have been reviewed and are unchanged unless specifically indicated below.  No changes  Medication List: Current Outpatient Medications  Medication Sig Dispense Refill   albuterol (PROVENTIL) (2.5 MG/3ML) 0.083% nebulizer solution Take 2.5 mg by nebulization every 6 (six) hours as needed for wheezing or shortness of breath.     albuterol (VENTOLIN HFA) 108 (90 Base) MCG/ACT inhaler CAN INHALE TWO PUFFS EVERY FOUR TO SIX HOURS AS  NEEDED FOR COUGH OR WHEEZE. 8.5 each 1   aspirin EC 81 MG tablet Take 81 mg by mouth daily.      atorvastatin (LIPITOR) 10 MG tablet Take 1 tablet (10 mg total) by mouth daily. 90 tablet 2   Budeson-Glycopyrrol-Formoterol (BREZTRI AEROSPHERE) 160-9-4.8 MCG/ACT AERO Inhale two puffs twice daily to prevent cough or wheeze.  Rinse, gargle, and spit after use. 10.7 g 5   hydrochlorothiazide (MICROZIDE) 12.5 MG capsule TAKE 1 CAPSULE BY MOUTH EVERY DAY 90 capsule 0   ibuprofen (ADVIL) 800 MG tablet Take one tablet once or twice daily with food     ipratropium-albuterol (DUONEB) 0.5-2.5 (3) MG/3ML SOLN Can use one vial in nebulizer every four to six hours as needed for cough or wheeze. 180 mL 1   levothyroxine (SYNTHROID, LEVOTHROID) 88 MCG tablet Take 88 mcg by mouth daily before breakfast.   5   loratadine (CLARITIN) 10 MG tablet Take 10 mg by mouth daily as needed for allergies. Reported on 07/21/2015     losartan (COZAAR) 100 MG tablet Take 100 mg by mouth daily.  1   methocarbamol (ROBAXIN) 500 MG tablet Take by mouth.     mometasone (NASONEX 24HR) 50 MCG/ACT nasal spray Place 2 sprays into the nose as needed.     pantoprazole (PROTONIX) 40 MG tablet Take 40 mg by mouth 2 (two) times daily.     Propylene Glycol (SYSTANE COMPLETE) 0.6 % SOLN Place 1 drop into both eyes daily as needed (dry eyes).     TEZSPIRE 210 MG/1. syringe Inject into  the skin.     Current Facility-Administered Medications  Medication Dose Route Frequency Provider Last Rate Last Admin   tezepelumab-ekko (TEZSPIRE) 210 MG/1. syringe 210 mg  210 mg Subcutaneous Q28 days Jessica Priest, MD   210 mg at 03/16/23 1438     Known medication allergies: Allergies  Allergen Reactions   Penicillins Hives and Other (See Comments)    Did it involve swelling of the face/tongue/throat, SOB, or low BP? No Did it involve sudden or severe rash/hives, skin peeling, or any reaction on the inside of your mouth or nose?    #  #  YES  #   #  Did you need to seek medical attention at a hospital or doctor's office?    #  #  YES  #  #  When did it last happen?     2019     Theophyllines Other (See Comments)    HEADACHE Severe headaches   Meloxicam     Upset stomach   Theophylline Other (See Comments)    REACTION: headaches     Physical examination: Blood pressure 132/80, pulse 82, temperature 98.6 F (37 C), temperature source Oral, resp. rate 18, SpO2 93%.  General: Alert, interactive, in no acute distress. HEENT: PERRLA, TMs pearly gray, turbinates moderately edematous with thick discharge, post-pharynx non erythematous. Neck: Supple without lymphadenopathy. Lungs: Mildly decreased breath sounds bilaterally without wheezing, rhonchi or rales. {no increased work of breathing.  Albuterol use approximately 2 hours prior to exam CV: Normal S1, S2 without murmurs. Abdomen: Nondistended, nontender. Skin: Warm and dry, without lesions or rashes. Extremities:  No clubbing, cyanosis or edema. Neuro:   Grossly intact.  Diagnositics/Labs: None today  Assessment and plan: Severe persistent asthma with exacerbation-recent sick contacts at work with development of symptoms.  Symptoms could still be viral in nature however with the increase in atypical bacterial infections in the community will elect to treat and cover with azithromycin.  She will also take prednisone course as below. Allergic rhinitis LPRD  1. Continue Tezepelumab injections  2. Continue Breztri - 2 inhalations 2 times per day    3. Continue nasonex 1 spray each nostril 1-2 times per day.   4. Continue pantoprazole 40mg  1-2 times per day  5. For current symptoms:  A.  Azithromycin 500mg  daily x 3 days  B.  Prednisone 20mg  twice a day x 5 days  C.  Today use Albuterol pump or nebulizer every 4 hours while awake (if sleeping do not wake for treatment) then resume as needed use tomorrow  D.  If having symptoms (coughing, wheeze, shortness of breath or  chest tightness) use DUONEB every 6 hours as needed  6. Can add OTC antihistamine - Zyrtec / Allegra / Claritin, nasal saline if needed  7. Return to clinic in 12 weeks or earlier if problem  I appreciate the opportunity to take part in [place] care. Please do not hesitate to contact me with questions.  Sincerely,   Margo Aye, MD Allergy/Immunology [facility]

## 2023-04-12 ENCOUNTER — Ambulatory Visit (INDEPENDENT_AMBULATORY_CARE_PROVIDER_SITE_OTHER): Payer: BC Managed Care – PPO

## 2023-04-12 DIAGNOSIS — J4551 Severe persistent asthma with (acute) exacerbation: Secondary | ICD-10-CM

## 2023-05-02 ENCOUNTER — Other Ambulatory Visit: Payer: Self-pay | Admitting: Allergy and Immunology

## 2023-05-10 ENCOUNTER — Ambulatory Visit: Payer: BC Managed Care – PPO

## 2023-05-11 ENCOUNTER — Ambulatory Visit (INDEPENDENT_AMBULATORY_CARE_PROVIDER_SITE_OTHER): Payer: Self-pay

## 2023-05-11 DIAGNOSIS — J4550 Severe persistent asthma, uncomplicated: Secondary | ICD-10-CM

## 2023-06-02 ENCOUNTER — Ambulatory Visit: Payer: BC Managed Care – PPO | Admitting: Cardiology

## 2023-06-07 ENCOUNTER — Ambulatory Visit (INDEPENDENT_AMBULATORY_CARE_PROVIDER_SITE_OTHER): Payer: BC Managed Care – PPO | Admitting: *Deleted

## 2023-06-07 DIAGNOSIS — J4550 Severe persistent asthma, uncomplicated: Secondary | ICD-10-CM

## 2023-06-08 ENCOUNTER — Ambulatory Visit: Payer: BC Managed Care – PPO

## 2023-06-10 ENCOUNTER — Encounter: Payer: Self-pay | Admitting: *Deleted

## 2023-06-10 ENCOUNTER — Ambulatory Visit: Payer: BC Managed Care – PPO | Attending: Cardiology | Admitting: Cardiology

## 2023-06-10 ENCOUNTER — Encounter: Payer: Self-pay | Admitting: Cardiology

## 2023-06-10 VITALS — BP 110/74 | HR 85 | Ht 61.0 in | Wt 303.0 lb

## 2023-06-10 DIAGNOSIS — R002 Palpitations: Secondary | ICD-10-CM | POA: Diagnosis not present

## 2023-06-10 DIAGNOSIS — G4733 Obstructive sleep apnea (adult) (pediatric): Secondary | ICD-10-CM

## 2023-06-10 DIAGNOSIS — E785 Hyperlipidemia, unspecified: Secondary | ICD-10-CM | POA: Diagnosis not present

## 2023-06-10 DIAGNOSIS — I2510 Atherosclerotic heart disease of native coronary artery without angina pectoris: Secondary | ICD-10-CM

## 2023-06-10 DIAGNOSIS — I5032 Chronic diastolic (congestive) heart failure: Secondary | ICD-10-CM

## 2023-06-10 MED ORDER — ATORVASTATIN CALCIUM 10 MG PO TABS: 10.0000 mg | ORAL_TABLET | Freq: Every day | ORAL | 3 refills | Status: AC

## 2023-06-10 NOTE — Progress Notes (Signed)
 Cardiology Office Note:  .   Date:  06/10/2023  ID:  Beth Rodriguez, DOB [birth year], MRN [id number] PCP: Krystal Clark, NP  [facility] Providers Cardiologist:  Gypsy Balsam, MD    History of Present Illness: Beth Rodriguez is a 63 y.o. female with a past medical history of hypertension, nonobstructive CAD per coronary CTA, congestive heart failure, OSA, GERD, hypothyroidism, history of DVT, hyperlipidemia, BMI greater than 40.  12/14/2021 monitor heart rate range of 49 to 210 bpm with an average heart rate of 75 bpm, predominant underlying rhythm was sinus, first-degree AV block was present, BBB/IVCD was present, 103 SVT runs occurred.  She was started on metoprolol 25 mg twice daily. 12/08/2021 coronary calcium score of 26, placing her in the 76 percentile. 01/19/2021 echocardiogram EF 60 to 65%, mild concentric LVH, grade 2 DD, trivial MR. 2018 stress test revealed small area of ischemia however she did not want to proceed with cardiac catheterization as she was asymptomatic.  She was most recently evaluated on 10/11/2022 for shortness of breath however she had also recently been evaluated by her PCP, had pretty extensive workup and was subsequently started on new medications and feeling better.  We did not make any changes to her plan of care, advised her to follow-up in 6 months.  She is today for follow-up of her nonobstructive CAD and hypertension.  She has been doing well from a cardiac perspective, she offers no formal complaints.  She works at [employer], does not participate in any formal exercise but she is very physically active at work.  She has been struggling with weight loss, has tried multiple diets but is actually gained a few pounds.  She feels like she probably only eats 1 meal per day. She denies chest pain, palpitations, dyspnea, pnd, orthopnea, n, v, dizziness, syncope, edema, weight gain, or early satiety.     ROS: Review of Systems   Constitutional: Negative.   HENT: Negative.    Eyes: Negative.   Respiratory: Negative.    Gastrointestinal: Negative.   Genitourinary: Negative.   Musculoskeletal: Negative.   Skin: Negative.   Neurological: Negative.   Endo/Heme/Allergies: Negative.   Psychiatric/Behavioral: Negative.    All other systems reviewed and are negative.    Studies Reviewed: .        Cardiac Studies & Procedures   ______________________________________________________________________________________________   STRESS TESTS  MYOCARDIAL PERFUSION IMAGING 03/30/2017   ECHOCARDIOGRAM  ECHOCARDIOGRAM COMPLETE 01/19/2021  Narrative ECHOCARDIOGRAM REPORT    Patient Name:   Beth Rodriguez Date of Exam: 01/19/2021 Medical Rec #:  [id number]      Height:       61.0 in Accession #:    [id number]     Weight:       290.6 lb Date of Birth:  Mar 31, 1961      BSA:          2.214 m Patient Age:    60 years       BP:           114/72 mmHg Patient Gender: F              HR:           77 bpm. Exam Location:  [facility]  Procedure: 2D Echo, Cardiac Doppler, Color Doppler and Strain Analysis  Indications:    Dyspnea R06.00  History:        Patient has prior history of Echocardiogram examinations, most recent 03/30/2017.  Cardiomegaly, CAD, Signs/Symptoms:Severe obesity; Risk Factors:Hypertension.  Sonographer:    Louie Boston RDCS Referring Phys: [phone] Marveen Reeks KRASOWSKI  IMPRESSIONS   1. Left ventricular ejection fraction, by estimation, is 60 to 65%. The left ventricle has normal function. The left ventricle has no regional wall motion abnormalities. There is mild concentric left ventricular hypertrophy. Left ventricular diastolic parameters are consistent with Grade II diastolic dysfunction (pseudonormalization). Elevated left atrial pressure. 2. Right ventricular systolic function is normal. The right ventricular size is normal. There is normal pulmonary artery systolic pressure. 3. The mitral valve  is normal in structure. Trivial mitral valve regurgitation. No evidence of mitral stenosis. 4. The aortic valve is tricuspid. Aortic valve regurgitation is not visualized. No aortic stenosis is present. 5. The inferior vena cava is normal in size with greater than 50% respiratory variability, suggesting right atrial pressure of 3 mmHg.  FINDINGS Left Ventricle: Left ventricular ejection fraction, by estimation, is 60 to 65%. The left ventricle has normal function. The left ventricle has no regional wall motion abnormalities. The left ventricular internal cavity size was normal in size. There is mild concentric left ventricular hypertrophy. Left ventricular diastolic parameters are consistent with Grade II diastolic dysfunction (pseudonormalization). Elevated left atrial pressure.  Right Ventricle: The right ventricular size is normal. No increase in right ventricular wall thickness. Right ventricular systolic function is normal. There is normal pulmonary artery systolic pressure. The tricuspid regurgitant velocity is 2.14 m/s, and with an assumed right atrial pressure of 3 mmHg, the estimated right ventricular systolic pressure is 21.3 mmHg.  Left Atrium: Left atrial size was normal in size.  Right Atrium: Right atrial size was normal in size.  Pericardium: There is no evidence of pericardial effusion.  Mitral Valve: The mitral valve is normal in structure. Trivial mitral valve regurgitation. No evidence of mitral valve stenosis.  Tricuspid Valve: The tricuspid valve is normal in structure. Tricuspid valve regurgitation is trivial. No evidence of tricuspid stenosis.  Aortic Valve: The aortic valve is tricuspid. Aortic valve regurgitation is not visualized. No aortic stenosis is present.  Pulmonic Valve: The pulmonic valve was normal in structure. Pulmonic valve regurgitation is not visualized. No evidence of pulmonic stenosis.  Aorta: The aortic root, ascending aorta, aortic arch and  descending aorta are all structurally normal, with no evidence of dilitation or obstruction.  Venous: The pulmonary veins were not well visualized. The inferior vena cava is normal in size with greater than 50% respiratory variability, suggesting right atrial pressure of 3 mmHg.  IAS/Shunts: No atrial level shunt detected by color flow Doppler.   LEFT VENTRICLE PLAX 2D LVIDd:         5.15 cm   Diastology LVIDs:         3.10 cm   LV e' medial:    5.98 cm/s LV PW:         1.20 cm   LV E/e' medial:  15.5 LV IVS:        1.20 cm   LV e' lateral:   9.14 cm/s LVOT diam:     2.20 cm   LV E/e' lateral: 10.1 LV SV:         95 LV SV Index:   43 LVOT Area:     3.80 cm   RIGHT VENTRICLE             IVC RV S prime:     11.60 cm/s  IVC diam: 1.90 cm TAPSE (M-mode): 3.3 cm  LEFT ATRIUM  Index        RIGHT ATRIUM           Index LA diam:      4.60 cm 2.08 cm/m   RA Area:     17.60 cm LA Vol (A2C): 48.5 ml 21.91 ml/m  RA Volume:   48.30 ml  21.82 ml/m LA Vol (A4C): 55.3 ml 24.98 ml/m AORTIC VALVE LVOT Vmax:   131.00 cm/s LVOT Vmean:  87.400 cm/s LVOT VTI:    0.251 m  AORTA Ao Root diam: 3.40 cm Ao Asc diam:  3.50 cm Ao Desc diam: 2.10 cm  MITRAL VALVE               TRICUSPID VALVE MV Area (PHT): 3.49 cm    TR Peak grad:   18.3 mmHg MV Decel Time: 218 msec    TR Vmax:        214.00 cm/s MV E velocity: 92.55 cm/s MV A velocity: 58.90 cm/s  SHUNTS MV E/A ratio:  1.57        Systemic VTI:  0.25 m Systemic Diam: 2.20 cm  Norman Herrlich MD Electronically signed by Norman Herrlich MD Signature Date/Time: 01/19/2021/12:02:02 PM    Final    MONITORS  LONG TERM MONITOR (3-14 DAYS) 12/10/2021  Narrative Patch Wear Time:  6 days and 15 hours (2023-08-18T09:00:32-0400 to 2023-08-25T00:06:51-0400)  Patient had a min HR of 49 bpm, max HR of 210 bpm, and avg HR of 75 bpm. Predominant underlying rhythm was Sinus Rhythm. First Degree AV Block was present. Bundle Branch Block/IVCD  was present. 103 Supraventricular Tachycardia runs occurred, the run with the fastest interval lasting 5 beats with a max rate of 210 bpm, the longest lasting 38.4 secs with an avg rate of 133 bpm. Supraventricular Tachycardia was detected within +/- 45 seconds of symptomatic patient event(s). Isolated SVEs were occasional (2.8%, 19952), SVE Couplets were rare (<1.0%, 147), and SVE Triplets were rare (<1.0%, 69). Isolated VEs were rare (<1.0%), VE Couplets were rare (<1.0%), and no VE Triplets were present. Ventricular Bigeminy was present.  Summary and conclusions: 103 episode of supraventricular tachycardia longest episode 38.4 seconds at rate of 133.  Symptomatic.   CT SCANS  CT CARDIAC SCORING (SELF PAY ONLY) 12/08/2021  Addendum 12/08/2021  6:17 PM ADDENDUM REPORT: 12/08/2021 18:15  ADDENDUM: CLINICAL DATA:  Cardiovascular Disease Risk stratification  EXAM:  Coronary Calcium Score  TECHNIQUE:  A gated, non-contrast computed tomography scan of the heart was  performed using 3mm slice thickness. Axial images were analyzed on a  dedicated workstation. Calcium scoring of the coronary arteries was  performed using the Agatston method.  FINDINGS:  Coronary Calcium Score:  Left main: 0  Left anterior descending artery: 26.2  Left circumflex artery: 0  Right coronary artery: 0  Total: 26.2  Percentile: 0  Pericardium: Normal.  Ascending Aorta: Upper limit of normal - 39 mm.  Pulmonary artery: Normal caliber  Non-cardiac: See separate report from [facility].  IMPRESSION: IMPRESSION  Coronary calcium score of 16.2 - LAD. This was 50 percentile for age, race, and sex-matched controls.  RECOMMENDATIONS:  Coronary artery calcium (CAC) score is a strong predictor of  incident coronary heart disease (CHD) and provides predictive  information beyond traditional risk factors. CAC scoring is  reasonable to use in the decision to withhold, postpone, or  initiate  statin therapy in intermediate-risk or selected borderline-risk  asymptomatic adults (age 59-75 years and LDL-C >=70 to <190 mg/dL)  who do not have  diabetes or established atherosclerotic  cardiovascular disease (ASCVD).* In intermediate-risk (10-year ASCVD  risk >=7.5% to <20%) adults or selected borderline-risk (10-year  ASCVD risk >=5% to <7.5%) adults in whom a CAC score is measured for  the purpose of making a treatment decision the following  recommendations have been made:  If CAC=0, it is reasonable to withhold statin therapy and reassess  in 5 to 10 years, as long as higher risk conditions are absent  (diabetes mellitus, family history of premature CHD in first degree  relatives (males <55 years; females <65 years), cigarette smoking,  or LDL >=190 mg/dL).  If CAC is 1 to 99, it is reasonable to initiate statin therapy for  patients >=64 years of age.  If CAC is >=100 or >=75th percentile, it is reasonable to initiate  statin therapy at any age.  Cardiology referral should be considered for patients with CAC  scores >=400 or >=75th percentile.  *2018 AHA/ACC/AACVPR/AAPA/ABC/ACPM/ADA/AGS/APhA/ASPC/NLA/PCNA  Guideline on the Management of Blood Cholesterol: A Report of the  American College of Cardiology/American Heart Association Task Force  on Clinical Practice Guidelines. J Am Coll Cardiol.  2019;73(24):3168-3209.   Electronically Signed By: Gypsy Balsam M.D. On: 12/08/2021 18:15  Narrative EXAM: OVER-READ INTERPRETATION  CT CHEST  The following report is an over-read performed by radiologist Dr. Maryelizabeth Rowan [facility] on 12/08/2021. This over-read does not include interpretation of cardiac or coronary anatomy or pathology. The calcium score interpretation by the cardiologist is attached.  COMPARISON:  None.  FINDINGS: Limited view of the lung parenchyma demonstrates no suspicious nodularity. Airways are  normal.  Limited view of the mediastinum demonstrates no adenopathy. Esophagus normal.  Limited view of the upper abdomen unremarkable.  Limited view of the skeleton and chest wall is unremarkable.  IMPRESSION: No significant extracardiac findings.  Electronically Signed: By: Genevive Bi M.D. On: 12/08/2021 17:28     ______________________________________________________________________________________________      Risk Assessment/Calculations:             Physical Exam:   VS:  BP 110/74 (BP Location: Right Arm, Patient Position: Sitting, Cuff Size: Large)   Pulse 85   Ht 5\' 1"  (1.549 m)   Wt (!) 303 lb (137.4 kg)   SpO2 96%   BMI 57.25 kg/m    Wt Readings from Last 3 Encounters:  06/10/23 (!) 303 lb (137.4 kg)  10/11/22 299 lb (135.6 kg)  06/17/22 297 lb 12.8 oz (135.1 kg)    GEN: Well nourished, well developed in no acute distress NECK: No JVD; No carotid bruits CARDIAC: RRR, no murmurs, rubs, gallops RESPIRATORY:  Clear to auscultation without rales, wheezing or rhonchi  ABDOMEN: Soft, non-tender, non-distended EXTREMITIES:  No edema; No deformity   ASSESSMENT AND PLAN: .   Nonobstructive CAD- coronary calcium score of 26 in 2023; Stable with no anginal symptoms. No indication for ischemic evaluation.  Continue aspirin 81 mg daily, continue Lipitor 10 mg daily.  Shortness of breath-currently at baseline, she is not currently bothered by this.  HFpEF-most recent echo revealed a preserved EF, grade 2 DD.  We did discuss SGLT2 inhibitor however she has a history of frequent UTIs and yeast infections.  Her breathing feels better right now so she wants to hold off on any further treatment at this time.  Hyperlipidemia-this is managed by her PCP, records are not available for review, continue atorvastatin.    Palpitations-currently quiescent  Obesity with BMI 57-she has been trying hard to lose weight, we did  discuss lifestyle modifications, we will bring her  back in 3 months see if she has been able to lose any weight, if not we will see if her insurance will cover Wegovy to help her augment her weight loss which will help with avoiding future MACE.    Dispo: Return in 3 months to see if she has been able to lose weight, if not we we will discuss Wegovy.  Signed, Flossie Dibble, NP

## 2023-06-10 NOTE — Patient Instructions (Signed)
 Medication Instructions:  Your physician recommends that you continue on your current medications as directed. Please refer to the Current Medication list given to you today.  *If you need a refill on your cardiac medications before your next appointment, please call your pharmacy*   Lab Work: None Ordered If you have labs (blood work) drawn today and your tests are completely normal, you will receive your results only by: MyChart Message (if you have MyChart) OR A paper copy in the mail If you have any lab test that is abnormal or we need to change your treatment, we will call you to review the results.   Testing/Procedures: None Ordered   Follow-Up: At [facility], you and your health needs are our priority.  As part of our continuing mission to provide you with exceptional heart care, we have created designated Provider Care Teams.  These Care Teams include your primary Cardiologist (physician) and Advanced Practice Providers (APPs -  Physician Assistants and Nurse Practitioners) who all work together to provide you with the care you need, when you need it.  We recommend signing up for the patient portal called "MyChart".  Sign up information is provided on this After Visit Summary.  MyChart is used to connect with patients for Virtual Visits (Telemedicine).  Patients are able to view lab/test results, encounter notes, upcoming appointments, etc.  Non-urgent messages can be sent to your provider as well.   To learn more about what you can do with MyChart, go to [email or web address].    Your next appointment:   3 month  Try to lose 10 pounds

## 2023-06-13 ENCOUNTER — Other Ambulatory Visit (HOSPITAL_COMMUNITY): Payer: BC Managed Care – PPO

## 2023-06-17 ENCOUNTER — Other Ambulatory Visit: Payer: Self-pay | Admitting: Allergy and Immunology

## 2023-07-03 ENCOUNTER — Other Ambulatory Visit: Payer: Self-pay | Admitting: Allergy and Immunology

## 2023-07-05 ENCOUNTER — Ambulatory Visit: Payer: BC Managed Care – PPO

## 2023-07-13 ENCOUNTER — Ambulatory Visit

## 2023-07-13 DIAGNOSIS — J4550 Severe persistent asthma, uncomplicated: Secondary | ICD-10-CM

## 2023-07-27 ENCOUNTER — Other Ambulatory Visit: Payer: Self-pay | Admitting: Allergy and Immunology

## 2023-08-10 ENCOUNTER — Ambulatory Visit

## 2023-08-11 ENCOUNTER — Ambulatory Visit

## 2023-08-11 DIAGNOSIS — J4550 Severe persistent asthma, uncomplicated: Secondary | ICD-10-CM

## 2023-09-08 ENCOUNTER — Ambulatory Visit: Payer: BC Managed Care – PPO | Admitting: Cardiology

## 2023-09-08 ENCOUNTER — Ambulatory Visit

## 2023-09-12 ENCOUNTER — Ambulatory Visit (INDEPENDENT_AMBULATORY_CARE_PROVIDER_SITE_OTHER): Admitting: *Deleted

## 2023-09-12 DIAGNOSIS — J4550 Severe persistent asthma, uncomplicated: Secondary | ICD-10-CM | POA: Diagnosis not present

## 2023-09-12 NOTE — Progress Notes (Signed)
 Cardiology Office Note:  .   Date:  09/13/2023  ID:  Beth Rodriguez, DOB [birth year], MRN [id number] PCP: Despina Floro, NP  [facility] Providers Cardiologist:  Ralene Burger, MD    History of Present Illness: Beth Rodriguez is a 63 y.o. female with a past medical history of hypertension, nonobstructive CAD per coronary CTA, congestive heart failure, OSA, GERD, hypothyroidism, history of DVT, hyperlipidemia, BMI greater than 40.  12/14/2021 monitor heart rate range of 49 to 210 bpm with an average heart rate of 75 bpm, predominant underlying rhythm was sinus, first-degree AV block was present, BBB/IVCD was present, 103 SVT runs occurred.  She was started on metoprolol  25 mg twice daily. 12/08/2021 coronary calcium  score of 26, placing her in the 76 percentile. 01/19/2021 echocardiogram EF 60 to 65%, mild concentric LVH, grade 2 DD, trivial MR. 2018 stress test revealed small area of ischemia however she did not want to proceed with cardiac catheterization as she was asymptomatic.  Evaluated on 10/11/2022 for shortness of breath however she had also recently been evaluated by her PCP, had pretty extensive workup and was subsequently started on new medications and feeling better.  We did not make any changes to her plan of care, advised her to follow-up in 6 months.  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  She presents today for follow-up for weight loss.  When she was last evaluated we made plans for 55-month follow-up to see if she would be able to lose some weight, with consideration of starting her on Wegovy  if she had not.  She states initially she was able to lose some weight at home however there was an unexpected death in her family and has actually gained a few pounds.  She continues to stay busy at work, is on her feet most of the day, does have some pedal edema by the end of the day but typically dissipates overnight.  Her blood pressure is elevated in the office today at 143/90 however she just took her medications prior to coming to her appointment and is typically better controlled at home. She denies chest pain, palpitations, dyspnea, pnd, orthopnea, n, v, dizziness, syncope, or early satiety.    ROS: Review of Systems  Constitutional: Negative.   HENT: Negative.    Eyes: Negative.   Respiratory: Negative.    Cardiovascular:  Positive for leg swelling.  Gastrointestinal: Negative.   Genitourinary: Negative.   Musculoskeletal: Negative.   Skin: Negative.   Neurological: Negative.   Endo/Heme/Allergies: Negative.   Psychiatric/Behavioral: Negative.    All other systems reviewed and are negative.    Studies Reviewed: .        Cardiac Studies & Procedures   ______________________________________________________________________________________________   STRESS TESTS  MYOCARDIAL PERFUSION IMAGING 03/30/2017   ECHOCARDIOGRAM  ECHOCARDIOGRAM COMPLETE 01/19/2021  Narrative ECHOCARDIOGRAM REPORT    Patient Name:   Beth Rodriguez Date of Exam: 01/19/2021 Medical Rec #:  [id number]      Height:       61.0 in Accession #:    [id number]     Weight:  290.6 lb Date of Birth:  1960-10-28      BSA:          2.214 m Patient Age:    60 years       BP:           114/72 mmHg Patient Gender: F              HR:           77 bpm. Exam Location:  [place]  Procedure: 2D Echo, Cardiac Doppler, Color Doppler and Strain Analysis  Indications:    Dyspnea R06.00  History:        Patient has prior history of Echocardiogram examinations, most recent 03/30/2017. Cardiomegaly, CAD,  Signs/Symptoms:Severe obesity; Risk Factors:Hypertension.  Sonographer:    Kristen Petri RDCS Referring Phys: [phone] Devorah Fonder KRASOWSKI  IMPRESSIONS   1. Left ventricular ejection fraction, by estimation, is 60 to 65%. The left ventricle has normal function. The left ventricle has no regional wall motion abnormalities. There is mild concentric left ventricular hypertrophy. Left ventricular diastolic parameters are consistent with Grade II diastolic dysfunction (pseudonormalization). Elevated left atrial pressure. 2. Right ventricular systolic function is normal. The right ventricular size is normal. There is normal pulmonary artery systolic pressure. 3. The mitral valve is normal in structure. Trivial mitral valve regurgitation. No evidence of mitral stenosis. 4. The aortic valve is tricuspid. Aortic valve regurgitation is not visualized. No aortic stenosis is present. 5. The inferior vena cava is normal in size with greater than 50% respiratory variability, suggesting right atrial pressure of 3 mmHg.  FINDINGS Left Ventricle: Left ventricular ejection fraction, by estimation, is 60 to 65%. The left ventricle has normal function. The left ventricle has no regional wall motion abnormalities. The left ventricular internal cavity size was normal in size. There is mild concentric left ventricular hypertrophy. Left ventricular diastolic parameters are consistent with Grade II diastolic dysfunction (pseudonormalization). Elevated left atrial pressure.  Right Ventricle: The right ventricular size is normal. No increase in right ventricular wall thickness. Right ventricular systolic function is normal. There is normal pulmonary artery systolic pressure. The tricuspid regurgitant velocity is 2.14 m/s, and with an assumed right atrial pressure of 3 mmHg, the estimated right ventricular systolic pressure is 21.3 mmHg.  Left Atrium: Left atrial size was normal in size.  Right Atrium: Right atrial size was  normal in size.  Pericardium: There is no evidence of pericardial effusion.  Mitral Valve: The mitral valve is normal in structure. Trivial mitral valve regurgitation. No evidence of mitral valve stenosis.  Tricuspid Valve: The tricuspid valve is normal in structure. Tricuspid valve regurgitation is trivial. No evidence of tricuspid stenosis.  Aortic Valve: The aortic valve is tricuspid. Aortic valve regurgitation is not visualized. No aortic stenosis is present.  Pulmonic Valve: The pulmonic valve was normal in structure. Pulmonic valve regurgitation is not visualized. No evidence of pulmonic stenosis.  Aorta: The aortic root, ascending aorta, aortic arch and descending aorta are all structurally normal, with no evidence of dilitation or obstruction.  Venous: The pulmonary veins were not well visualized. The inferior vena cava is normal in size with greater than 50% respiratory variability, suggesting right atrial pressure of 3 mmHg.  IAS/Shunts: No atrial level shunt detected by color flow Doppler.   LEFT VENTRICLE PLAX 2D LVIDd:         5.15 cm   Diastology LVIDs:         3.10 cm   LV e' medial:  5.98 cm/s LV PW:         1.20 cm   LV E/e' medial:  15.5 LV IVS:        1.20 cm   LV e' lateral:   9.14 cm/s LVOT diam:     2.20 cm   LV E/e' lateral: 10.1 LV SV:         95 LV SV Index:   43 LVOT Area:     3.80 cm   RIGHT VENTRICLE             IVC RV S prime:     11.60 cm/s  IVC diam: 1.90 cm TAPSE (M-mode): 3.3 cm  LEFT ATRIUM           Index        RIGHT ATRIUM           Index LA diam:      4.60 cm 2.08 cm/m   RA Area:     17.60 cm LA Vol (A2C): 48.5 ml 21.91 ml/m  RA Volume:   48.30 ml  21.82 ml/m LA Vol (A4C): 55.3 ml 24.98 ml/m AORTIC VALVE LVOT Vmax:   131.00 cm/s LVOT Vmean:  87.400 cm/s LVOT VTI:    0.251 m  AORTA Ao Root diam: 3.40 cm Ao Asc diam:  3.50 cm Ao Desc diam: 2.10 cm  MITRAL VALVE               TRICUSPID VALVE MV Area (PHT): 3.49 cm    TR  Peak grad:   18.3 mmHg MV Decel Time: 218 msec    TR Vmax:        214.00 cm/s MV E velocity: 92.55 cm/s MV A velocity: 58.90 cm/s  SHUNTS MV E/A ratio:  1.57        Systemic VTI:  0.25 m Systemic Diam: 2.20 cm  Zoe Hinds MD Electronically signed by Zoe Hinds MD Signature Date/Time: 01/19/2021/12:02:02 PM    Final    MONITORS  LONG TERM MONITOR (3-14 DAYS) 12/10/2021  Narrative Patch Wear Time:  6 days and 15 hours (2023-08-18T09:00:32-0400 to 2023-08-25T00:06:51-0400)  Patient had a min HR of 49 bpm, max HR of 210 bpm, and avg HR of 75 bpm. Predominant underlying rhythm was Sinus Rhythm. First Degree AV Block was present. Bundle Branch Block/IVCD was present. 103 Supraventricular Tachycardia runs occurred, the run with the fastest interval lasting 5 beats with a max rate of 210 bpm, the longest lasting 38.4 secs with an avg rate of 133 bpm. Supraventricular Tachycardia was detected within +/- 45 seconds of symptomatic patient event(s). Isolated SVEs were occasional (2.8%, 19952), SVE Couplets were rare (<1.0%, 147), and SVE Triplets were rare (<1.0%, 69). Isolated VEs were rare (<1.0%), VE Couplets were rare (<1.0%), and no VE Triplets were present. Ventricular Bigeminy was present.  Summary and conclusions: 103 episode of supraventricular tachycardia longest episode 38.4 seconds at rate of 133.  Symptomatic.   CT SCANS  CT CARDIAC SCORING (SELF PAY ONLY) 12/08/2021  Addendum 12/08/2021  6:17 PM ADDENDUM REPORT: 12/08/2021 18:15  ADDENDUM: CLINICAL DATA:  Cardiovascular Disease Risk stratification  EXAM:  Coronary Calcium  Score  TECHNIQUE:  A gated, non-contrast computed tomography scan of the heart was  performed using 3mm slice thickness. Axial images were analyzed on a  dedicated workstation. Calcium  scoring of the coronary arteries was  performed using the Agatston method.  FINDINGS:  Coronary Calcium  Score:  Left main: 0  Left anterior descending  artery: 26.2  Left circumflex  artery: 0  Right coronary artery: 0  Total: 26.2  Percentile: 0  Pericardium: Normal.  Ascending Aorta: Upper limit of normal - 39 mm.  Pulmonary artery: Normal caliber  Non-cardiac: See separate report from [facility].  IMPRESSION: IMPRESSION  Coronary calcium  score of 16.2 - LAD. This was 57 percentile for age, race, and sex-matched controls.  RECOMMENDATIONS:  Coronary artery calcium  (CAC) score is a strong predictor of  incident coronary heart disease (CHD) and provides predictive  information beyond traditional risk factors. CAC scoring is  reasonable to use in the decision to withhold, postpone, or initiate  statin therapy in intermediate-risk or selected borderline-risk  asymptomatic adults (age 11-75 years and LDL-C >=70 to <190 mg/dL)  who do not have diabetes or established atherosclerotic  cardiovascular disease (ASCVD).* In intermediate-risk (10-year ASCVD  risk >=7.5% to <20%) adults or selected borderline-risk (10-year  ASCVD risk >=5% to <7.5%) adults in whom a CAC score is measured for  the purpose of making a treatment decision the following  recommendations have been made:  If CAC=0, it is reasonable to withhold statin therapy and reassess  in 5 to 10 years, as long as higher risk conditions are absent  (diabetes mellitus, family history of premature CHD in first degree  relatives (males <55 years; females <65 years), cigarette smoking,  or LDL >=190 mg/dL).  If CAC is 1 to 99, it is reasonable to initiate statin therapy for  patients >=39 years of age.  If CAC is >=100 or >=75th percentile, it is reasonable to initiate  statin therapy at any age.  Cardiology referral should be considered for patients with CAC  scores >=400 or >=75th percentile.  *2018 AHA/ACC/AACVPR/AAPA/ABC/ACPM/ADA/AGS/APhA/ASPC/NLA/PCNA  Guideline on the Management of Blood Cholesterol: A Report of the  American  College of Cardiology/American Heart Association Task Force  on Clinical Practice Guidelines. J Am Coll Cardiol.  2019;73(24):3168-3209.   Electronically Signed By: Ralene Burger M.D. On: 12/08/2021 18:15  Narrative EXAM: OVER-READ INTERPRETATION  CT CHEST  The following report is an over-read performed by radiologist Dr. Pearlean Botts [facility] on 12/08/2021. This over-read does not include interpretation of cardiac or coronary anatomy or pathology. The calcium  score interpretation by the cardiologist is attached.  COMPARISON:  None.  FINDINGS: Limited view of the lung parenchyma demonstrates no suspicious nodularity. Airways are normal.  Limited view of the mediastinum demonstrates no adenopathy. Esophagus normal.  Limited view of the upper abdomen unremarkable.  Limited view of the skeleton and chest wall is unremarkable.  IMPRESSION: No significant extracardiac findings.  Electronically Signed: By: Deboraha Fallow M.D. On: 12/08/2021 17:28     ______________________________________________________________________________________________      Risk Assessment/Calculations:          Physical Exam:   VS:  BP (!) 143/90   Pulse 75   Ht 5\' 1"  (1.549 m)   Wt (!) 306 lb (138.8 kg)   SpO2 96%   BMI 57.82 kg/m    Wt Readings from Last 3 Encounters:  09/13/23 (!) 306 lb (138.8 kg)  06/10/23 (!) 303 lb (137.4 kg)  10/11/22 299 lb (135.6 kg)    GEN: Well nourished, well developed in no acute distress NECK: No JVD; No carotid bruits CARDIAC: RRR, no murmurs, rubs, gallops RESPIRATORY:  Clear to auscultation without rales, wheezing or rhonchi  ABDOMEN: Soft, non-tender, non-distended EXTREMITIES:  No edema; No deformity   ASSESSMENT AND PLAN: .   Nonobstructive CAD- coronary calcium  score of 26 in 2023; Stable  with no anginal symptoms. No indication for ischemic evaluation.  Continue aspirin 81 mg daily, continue Lipitor 10 mg daily.  Heart healthy diet and regular cardiovascular exercise encouraged.    HFpEF-most recent echo revealed a preserved EF, grade 2 DD.  We did discuss SGLT2 inhibitor however she has a history of frequent UTIs and yeast infections.  Her breathing feels better right now so she wants to hold off on any further treatment at this time.  Continue losartan  100 mg daily.  Hyperlipidemia-this is managed by her PCP, records are not available for review, continue atorvastatin .    Palpitations-currently quiescent.  Obesity with BMI 57-this was the purpose of her visit today to see if she had been able to lose weight in the last 3 months, she initially did have some weight loss however unfortunately has gained 3 pounds since she was last evaluated in our office.  We discussed ZOXWRU, she is concerned about the side effects and would like to try and lose weight on her own. Heart healthy diet and regular cardiovascular exercise encouraged.      Dispo: Return in 6 months to see if she has been able to lose weight, if not we we will discuss Wegovy again.  Signed, Terrance Ferretti, NP

## 2023-09-13 ENCOUNTER — Encounter: Payer: Self-pay | Admitting: Cardiology

## 2023-09-13 ENCOUNTER — Ambulatory Visit

## 2023-09-13 ENCOUNTER — Ambulatory Visit: Attending: Cardiology | Admitting: Cardiology

## 2023-09-13 VITALS — BP 143/90 | HR 75 | Ht 61.0 in | Wt 306.0 lb

## 2023-09-13 DIAGNOSIS — I2510 Atherosclerotic heart disease of native coronary artery without angina pectoris: Secondary | ICD-10-CM | POA: Diagnosis not present

## 2023-09-13 DIAGNOSIS — E782 Mixed hyperlipidemia: Secondary | ICD-10-CM

## 2023-09-13 DIAGNOSIS — I5032 Chronic diastolic (congestive) heart failure: Secondary | ICD-10-CM | POA: Diagnosis not present

## 2023-09-13 DIAGNOSIS — R002 Palpitations: Secondary | ICD-10-CM

## 2023-09-13 NOTE — Patient Instructions (Signed)
 Medication Instructions:   *If you need a refill on your cardiac medications before your next appointment, please call your pharmacy*  Lab Work:  If you have labs (blood work) drawn today and your tests are completely normal, you will receive your results only by: MyChart Message (if you have MyChart) OR A paper copy in the mail If you have any lab test that is abnormal or we need to change your treatment, we will call you to review the results.  Testing/Procedures:    Follow-Up: At [facility], you and your health needs are our priority.  As part of our continuing mission to provide you with exceptional heart care, our providers are all part of one team.  This team includes your primary Cardiologist (physician) and Advanced Practice Providers or APPs (Physician Assistants and Nurse Practitioners) who all work together to provide you with the care you need, when you need it.  Your next appointment:   6 month(s)  Provider:   Pattricia Bores, NP Georgeana Kindler)    We recommend signing up for the patient portal called "MyChart".  Sign up information is provided on this After Visit Summary.  MyChart is used to connect with patients for Virtual Visits (Telemedicine).  Patients are able to view lab/test results, encounter notes, upcoming appointments, etc.  Non-urgent messages can be sent to your provider as well.   To learn more about what you can do with MyChart, go to [email or web address].   Other Instructions  Work on eating small, frequent meals, try to get 4,000 steps/day

## 2023-09-16 ENCOUNTER — Other Ambulatory Visit: Payer: Self-pay | Admitting: Allergy and Immunology

## 2023-10-10 ENCOUNTER — Ambulatory Visit (INDEPENDENT_AMBULATORY_CARE_PROVIDER_SITE_OTHER): Admitting: *Deleted

## 2023-10-10 DIAGNOSIS — J4550 Severe persistent asthma, uncomplicated: Secondary | ICD-10-CM | POA: Diagnosis not present

## 2023-11-07 ENCOUNTER — Ambulatory Visit

## 2023-11-08 ENCOUNTER — Ambulatory Visit (INDEPENDENT_AMBULATORY_CARE_PROVIDER_SITE_OTHER): Admitting: *Deleted

## 2023-11-08 DIAGNOSIS — J4550 Severe persistent asthma, uncomplicated: Secondary | ICD-10-CM | POA: Diagnosis not present

## 2023-11-21 ENCOUNTER — Telehealth: Payer: Self-pay | Admitting: Allergy and Immunology

## 2023-11-21 NOTE — Telephone Encounter (Signed)
 Informed patient she is due for a Tezspire  reapproval appointment. She states she will schedule her appointment when she comes in for her injection on 8/26.

## 2023-11-26 ENCOUNTER — Other Ambulatory Visit: Payer: Self-pay | Admitting: Allergy and Immunology

## 2023-12-05 NOTE — Telephone Encounter (Signed)
 Patient notified and understood

## 2023-12-06 ENCOUNTER — Encounter: Payer: Self-pay | Admitting: Allergy

## 2023-12-06 ENCOUNTER — Ambulatory Visit

## 2023-12-06 ENCOUNTER — Ambulatory Visit: Admitting: Allergy

## 2023-12-06 VITALS — BP 128/86 | HR 88 | Resp 24

## 2023-12-06 DIAGNOSIS — J3089 Other allergic rhinitis: Secondary | ICD-10-CM

## 2023-12-06 DIAGNOSIS — J4550 Severe persistent asthma, uncomplicated: Secondary | ICD-10-CM | POA: Diagnosis not present

## 2023-12-06 DIAGNOSIS — K219 Gastro-esophageal reflux disease without esophagitis: Secondary | ICD-10-CM

## 2023-12-06 DIAGNOSIS — J4551 Severe persistent asthma with (acute) exacerbation: Secondary | ICD-10-CM

## 2023-12-06 MED ORDER — PREDNISONE 10 MG PO TABS: ORAL_TABLET | ORAL | 0 refills | Status: DC

## 2023-12-06 MED ORDER — AZITHROMYCIN 500 MG PO TABS: ORAL_TABLET | ORAL | 0 refills | Status: DC

## 2023-12-06 NOTE — Patient Instructions (Addendum)
  1. Continue Tezepelumab  injections monthly  2. Continue Breztri  - 2 inhalations 2 times per day    3. Continue nasonex  1 spray each nostril 1-2 times per day for nasal congestion  4. Continue pantoprazole  40mg  1-2 times per day for reflux control  5. For current symptoms:  A.  Azithromycin  500mg  daily x 3 days  B.  Prednisone  20mg  twice a day x 5 days  C.  Today use Albuterol  pump or nebulizer every 4 hours while awake (if sleeping do not wake for treatment) then resume as needed use tomorrow  D.  If having symptoms (coughing, wheeze, shortness of breath or chest tightness) use DUONEB every 6 hours as needed  6. Can add OTC antihistamine - Zyrtec / Allegra / Claritin , nasal saline if needed  7. Return to clinic in 12 weeks or earlier if problem

## 2023-12-06 NOTE — Progress Notes (Signed)
 Follow-up Note  RE: ORCHID GLASSBERG MRN: [id number] DOB: March 04, 1961 Date of Office Visit: 12/06/2023   History of present illness: Beth Rodriguez is a 63 y.o. female presenting today for follow-up of asthma, allergic rhinitis and LPRD.  She was last seen in the office on 04/05/23 by myself.  She presents today with her husband.  Discussed the use of AI scribe software for clinical note transcription with the patient, who gave verbal consent to proceed.  She has been experiencing a productive cough with green sputum for the past couple of days. No fevers, night sweats, or chills. She also has wheezing and chest tightness, particularly when coughing.  She uses her handheld rescue inhaler approximately three times a day since symptoms started, which provides some relief. She has not yet used her nebulizer but has the vials available if needed. She continues to use Breztri , two puffs twice a day, and recently received a Tezspire  shot which she receives once a month. She also takes pantoprazole  and uses a nasal spray as needed. She has not used any antihistamines recently.  Her past medical history includes asthma, for which she has previously required antibiotics and steroids during exacerbations. She has a known penicillin allergy. She works in an environment where she is potentially exposed to respiratory infections, as she works at [employer] and is exposed to people who have children.     Review of systems: 10pt ROS negative unless noted above in HPI   Past medical/social/surgical/family history have been reviewed and are unchanged unless specifically indicated below.  No changes  Medication List: Current Outpatient Medications  Medication Sig Dispense Refill   albuterol  (PROVENTIL ) (2.5 MG/3ML) 0.083% nebulizer solution Take 2.5 mg by nebulization every 6 (six) hours as needed for wheezing or shortness of breath.     albuterol  (VENTOLIN  HFA) 108 (90 Base) MCG/ACT inhaler CAN INHALE TWO PUFFS  EVERY FOUR TO SIX HOURS AS NEEDED FOR COUGH OR WHEEZE. 8.5 each 1   aspirin EC 81 MG tablet Take 81 mg by mouth daily.      atorvastatin  (LIPITOR) 10 MG tablet Take 1 tablet (10 mg total) by mouth daily. 90 tablet 3   budeson-glycopyrrolate -formoterol  (BREZTRI  AEROSPHERE) 160-9-4.8 MCG/ACT AERO inhaler INHALE TWO PUFFS TWICE DAILY TO PREVENT COUGH OR WHEEZE. RINSE, GARGLE, AND SPIT AFTER USE. 10.7 each 3   furosemide (LASIX) 20 MG tablet Take 20 mg by mouth daily.     ibuprofen (ADVIL) 800 MG tablet Take one tablet once or twice daily with food     ipratropium-albuterol  (DUONEB) 0.5-2.5 (3) MG/3ML SOLN Can use one vial in nebulizer every four to six hours as needed for cough or wheeze. 180 mL 1   levothyroxine  (SYNTHROID , LEVOTHROID) 88 MCG tablet Take 88 mcg by mouth daily before breakfast.   5   loratadine  (CLARITIN ) 10 MG tablet Take 10 mg by mouth daily as needed for allergies. Reported on 07/21/2015     losartan  (COZAAR ) 100 MG tablet Take 100 mg by mouth daily.  1   mometasone  (NASONEX  24HR) 50 MCG/ACT nasal spray Place 2 sprays into the nose as needed.     pantoprazole  (PROTONIX ) 40 MG tablet Take 40 mg by mouth daily.     Propylene Glycol (SYSTANE COMPLETE) 0.6 % SOLN Place 1 drop into both eyes daily as needed (dry eyes).     TEZSPIRE  210 MG/1. syringe INJECT 210 MG UNDER THE SKIN EVERY 4 WEEKS (TO BE ADMINISTERED BY A HEALTH CARE PROVIDER IN A  HEALTH CARE SETTING, DISCONTINUE FASENRA ) 1.91 mL 11   Current Facility-Administered Medications  Medication Dose Route Frequency Provider Last Rate Last Admin   tezepelumab -ekko (TEZSPIRE ) 210 MG/1. syringe 210 mg  210 mg Subcutaneous Q28 days Kozlow, Eric J, MD   210 mg at 12/06/23 1002     Known medication allergies: Allergies  Allergen Reactions   Penicillins Hives and Other (See Comments)    Did it involve swelling of the face/tongue/throat, SOB, or low BP? No Did it involve sudden or severe rash/hives, skin peeling, or any  reaction on the inside of your mouth or nose?    #  #  YES  #  #  Did you need to seek medical attention at a hospital or doctor's office?    #  #  YES  #  #  When did it last happen?     2019     Theophyllines Other (See Comments)    HEADACHE Severe headaches   Meloxicam     Upset stomach   Theophylline Other (See Comments)    REACTION: headaches     Physical examination: Blood pressure 128/86, pulse 88, resp. rate (!) 24, SpO2 96%.  General: Alert, interactive, in no acute distress. HEENT: PERRLA, TMs pearly gray, turbinates non-edematous without discharge, post-pharynx non erythematous. Neck: Supple without lymphadenopathy. Lungs: Decreased breath sounds bilaterally without wheezing, rhonchi or rales.  increased work of breathing. CV: Normal S1, S2 without murmurs. Abdomen: Nondistended, nontender. Skin: Warm and dry, without lesions or rashes. Extremities:  No clubbing, cyanosis or edema. Neuro:   Grossly intact.  Diagnostics/Labs: Duoneb given in office due to lung exam--post DuoNeb she did have increased lung sounds throughout  Assessment and plan: Severe persistent asthma with exacerbation Allergic rhinitis LPRD   1. Continue Tezepelumab  injections monthly  2. Continue Breztri  - 2 inhalations 2 times per day    3. Continue nasonex  1 spray each nostril 1-2 times per day for nasal congestion  4. Continue pantoprazole  40mg  1-2 times per day for reflux control  5. For current symptoms:  A.  Azithromycin  500mg  daily x 3 days  B.  Prednisone  20mg  twice a day x 5 days  C.  Today use Albuterol  pump or nebulizer every 4 hours while awake (if sleeping do not wake for treatment) then resume as needed use tomorrow  D.  If having symptoms (coughing, wheeze, shortness of breath or chest tightness) use DUONEB every 6 hours as needed  6. Can add OTC antihistamine - Zyrtec / Allegra / Claritin , nasal saline if needed  7. Return to clinic in 12 weeks or earlier if problem  I  appreciate the opportunity to take part in [place] care. Please do not hesitate to contact me with questions.  Sincerely,   Danita Brain, MD Allergy/Immunology [facility]

## 2023-12-07 ENCOUNTER — Encounter: Payer: Self-pay | Admitting: Cardiology

## 2023-12-08 ENCOUNTER — Other Ambulatory Visit: Payer: Self-pay

## 2023-12-08 ENCOUNTER — Other Ambulatory Visit: Payer: Self-pay | Admitting: Allergy and Immunology

## 2023-12-08 MED ORDER — MOMETASONE FUROATE 50 MCG/ACT NA SUSP: NASAL | 5 refills | Status: DC

## 2023-12-16 ENCOUNTER — Telehealth: Payer: Self-pay

## 2023-12-16 ENCOUNTER — Other Ambulatory Visit (HOSPITAL_COMMUNITY): Payer: Self-pay

## 2023-12-16 NOTE — Telephone Encounter (Signed)
*  AA  Pharmacy Patient Advocate Encounter   Received notification from Fax that prior authorization for Mometasone  Nasal Spray is required/requested.   Insurance verification completed.   The patient is insured through [employer] .   Per test claim:  Flonase /Fluticasone  Nasal Spray is preferred by the insurance.  If suggested medication is appropriate, Please send in a new RX and discontinue this one. If not, please advise as to why it's not appropriate so that we may request a Prior Authorization. Please note, some preferred medications may still require a PA.  If the suggested medications have not been trialed and there are no contraindications to their use, the PA will not be submitted, as it will not be approved.   CMM Key: [id number]

## 2023-12-19 NOTE — Telephone Encounter (Signed)
 Your request has been approved CaseId:[id number];Status:Approved;Review Type:Prior Auth;Coverage Start Date:11/19/2023;Coverage End Date:12/18/2024; Authorization Expiration09/11/2024

## 2023-12-29 ENCOUNTER — Other Ambulatory Visit (HOSPITAL_COMMUNITY): Payer: Self-pay

## 2023-12-29 ENCOUNTER — Telehealth: Payer: Self-pay

## 2023-12-29 ENCOUNTER — Ambulatory Visit: Admitting: Allergy and Immunology

## 2023-12-29 VITALS — BP 136/82 | HR 86 | Resp 16

## 2023-12-29 DIAGNOSIS — J3089 Other allergic rhinitis: Secondary | ICD-10-CM

## 2023-12-29 DIAGNOSIS — R0609 Other forms of dyspnea: Secondary | ICD-10-CM

## 2023-12-29 DIAGNOSIS — J4550 Severe persistent asthma, uncomplicated: Secondary | ICD-10-CM | POA: Diagnosis not present

## 2023-12-29 DIAGNOSIS — Z6841 Body Mass Index (BMI) 40.0 and over, adult: Secondary | ICD-10-CM

## 2023-12-29 DIAGNOSIS — K219 Gastro-esophageal reflux disease without esophagitis: Secondary | ICD-10-CM

## 2023-12-29 DIAGNOSIS — E66813 Obesity, class 3: Secondary | ICD-10-CM

## 2023-12-29 MED ORDER — MOMETASONE FUROATE 50 MCG/ACT NA SUSP: NASAL | 5 refills | Status: AC

## 2023-12-29 MED ORDER — FAMOTIDINE 40 MG PO TABS: 40.0000 mg | ORAL_TABLET | Freq: Every evening | ORAL | 5 refills | Status: AC

## 2023-12-29 MED ORDER — WEGOVY 0.25 MG/0.5ML ~~LOC~~ SOAJ: 0.2500 mg | SUBCUTANEOUS | 5 refills | Status: DC

## 2023-12-29 NOTE — Patient Instructions (Addendum)
  1. Continue Tezepelumab  injections monthly  2. Continue Breztri  - 2 inhalations 2 times per day  (empty lungs)  3. Continue nasonex  1 spray each nostril 1-2 times per day  4. Continue pantoprazole  40mg  2 times per day  5. Start Famotidine  40 mg - 1 tablet in evening  6. Start Wegovy  injections  7. If needed:   A. Albuterol  - 2 inhalations or DUONEB nebulizer every 4 hours    B. OTC antihistamine - Zyrtec / Allegra / Claritin , nasal saline   8. Return to clinic in 12 weeks or earlier if problem  9. Influenza = Tamiflu. Covid = Paxlovid

## 2023-12-29 NOTE — Telephone Encounter (Signed)
*  [facility]  Pharmacy Patient Advocate Encounter   Received notification from CoverMyMeds that prior authorization for Wegovy  0.25MG /0.5ML auto-injectors   is required/requested.   Insurance verification completed.   The patient is insured through [employer] .   Per test claim: PA required; PA submitted to above mentioned insurance via Latent Key/confirmation #/[id number] [facility] Status is pending

## 2023-12-29 NOTE — Progress Notes (Signed)
 [place] - [place] - [place] - [place] - [place]   Follow-up Note  Referring Provider: Benson Eleanor PARAS* Primary Provider: Benson Eleanor Rung, NP Date of Office Visit: 12/29/2023  Subjective:   Beth Rodriguez (DOB: Mar 24, 1961) is a 63 y.o. female who [facility] in re-evaluation of the following:  HPI: Beth Rodriguez returns to this clinic in evaluation of severe asthma, dyspnea on exertion, obesity, allergic rhinitis, LPR.  Last saw her in this clinic 27 September 2022.  She did visit with Dr. Jeneal 06 December 2023 with an apparent exacerbation of her asthma requiring a systemic steroid.  Beth Rodriguez's big issues revolve around the fact that she gets very short of breath when she exerts herself.  Occasionally she has some coughing and wheezing but it is dyspnea on exertion that really bothers her especially while at work.  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  And she has also been having lots of problems with lower back pain and knee pain and she is having that evaluated by her orthopedic doctor.  And she is having recurrent sore throat which has been a problem now for a few months.  She is using pantoprazole  just 1 time per day to treat her reflux.  She continues on a large collection of anti-inflammatory agents for airway including the use of tezepelumab  and she rarely uses any short acting bronchodilator.  Allergies as of 12/29/2023       Reactions   Penicillins Hives, Other (See Comments)   Did it involve swelling of the face/tongue/throat, SOB, or low BP? No Did it involve sudden or severe rash/hives, skin peeling, or any reaction on the inside of your mouth or nose?    #  #  YES  #  #  Did you need to seek medical attention at a hospital or doctor's office?    #  #  YES  #  #  When did it last  happen?     2019    Theophyllines Other (See Comments)   HEADACHE Severe headaches   Meloxicam    Upset stomach   Theophylline Other (See Comments)   REACTION: headaches        Medication List    albuterol  (2.5 MG/3ML) 0.083% nebulizer solution Commonly known as: PROVENTIL  Take 2.5 mg by nebulization every 6 (six) hours as needed for wheezing or shortness of breath.   albuterol  108 (90 Base) MCG/ACT inhaler Commonly known as: VENTOLIN  HFA CAN INHALE TWO PUFFS EVERY FOUR TO SIX HOURS AS NEEDED FOR COUGH OR WHEEZE.   aspirin EC 81 MG tablet Take 81 mg by mouth daily.   atorvastatin  10 MG tablet Commonly known as: LIPITOR Take 1 tablet (10 mg total) by mouth daily.   Breztri  Aerosphere 160-9-4.8 MCG/ACT Aero inhaler Generic drug: budesonide -glycopyrrolate -formoterol  INHALE TWO PUFFS TWICE DAILY TO PREVENT COUGH OR WHEEZE. RINSE, GARGLE, AND SPIT AFTER USE.   furosemide 20 MG tablet Commonly known as: LASIX Take 20 mg by mouth daily.   ibuprofen 800 MG tablet Commonly known as: ADVIL Take one tablet once or twice daily with food   ipratropium-albuterol  0.5-2.5 (3) MG/3ML Soln Commonly known as: DUONEB Can use one vial in nebulizer every four to six hours as needed for cough or wheeze.   levothyroxine  88 MCG tablet Commonly known as: SYNTHROID   Take 88 mcg by mouth daily before breakfast.   loratadine  10 MG tablet Commonly known as: CLARITIN  Take 10 mg by mouth daily as needed for allergies. Reported on 07/21/2015   losartan  100 MG tablet Commonly known as: COZAAR  Take 100 mg by mouth daily.   mometasone  50 MCG/ACT nasal spray Commonly known as: Nasonex  24HR Use one spray in each nostril one to two times per day.   pantoprazole  40 MG tablet Commonly known as: PROTONIX  Take 40 mg by mouth daily.   predniSONE  10 MG tablet Commonly known as: DELTASONE  Take 2 tablets twice daily for 5 days.   Systane Complete 0.6 % Soln Generic drug: Propylene Glycol Place  1 drop into both eyes daily as needed (dry eyes).   Tezspire  210 MG/1. syringe Generic drug: tezepelumab -ekko INJECT 210 MG UNDER THE SKIN EVERY 4 WEEKS (TO BE ADMINISTERED BY A HEALTH CARE PROVIDER IN A HEALTH CARE SETTING, DISCONTINUE FASENRA )    Past Medical History:  Diagnosis Date   Abnormal stress test 04/18/2017   Acquired hypothyroidism 07/18/2015   Last Assessment & Plan:  Formatting of this note might be different from the original. Relevant Hx: Course: Daily Update: Today's Plan:she is going to have her TSH updated for her   Electronically signed by: Eleanor Merlynn Lady, NP 07/22/15 2115   ADRENAL INSUFFICIENCY, HX OF 01/23/2010   Qualifier: Diagnosis of  By: Daryl FNP, Melissa S    Allergic rhinitis 12/12/2014   Aortic atherosclerosis (HCC) 02/18/2017   Formatting of this note might be different from the original. Noted on CT scan ([facility]) 02/2017   Arthritis    Asthma    ASTHMA 01/16/2010   Qualifier: Diagnosis of  By: Daryl FNP, Melissa S    Cardiomegaly 01/16/2010   Qualifier: Diagnosis of  By: Daryl FNP, Melissa S    Carpal tunnel syndrome on right    Coronary artery disease 07/21/2017   With mildly abnormal stress test showing small defect involving apex indicating distal LAD disease  Formatting of this note might be different from the original. Overview:  With mildly abnormal stress test showing small defect involving apex indicating distal LAD disease   COUGH, CHRONIC 01/23/2010   Qualifier: Diagnosis of  By: Daryl FNP, Melissa S    DEEP VENOUS THROMBOPHLEBITIS, LEG, RIGHT, HX OF 01/16/2010   Qualifier: Diagnosis of  By: Daryl FNP, Melissa S    Degeneration of lumbar intervertebral disc 07/18/2015   Last Assessment & Plan:  Formatting of this note might be different from the original. Relevant Hx: Course: Daily Update: Today's Plan:longstanding for her and she is still working and on her feet some days being harder for her than others,  she is to again work on her weight to help with this  Electronically signed by: Eleanor Merlynn Lady, NP 07/22/15 2113   DIASTOLIC DYSFUNCTION 01/29/2010   Annotation: grade 2 per 2-D echo 01/26/10 Qualifier: Diagnosis of  By: Daryl FNP, Melissa S    Diverticulosis 10/03/2017   DYSPNEA ON EXERTION 01/16/2010   Qualifier: Diagnosis of  By: Daryl FNP, Melissa S    Dyspnea on exertion 03/17/2017   Edema 01/16/2010   Qualifier: Diagnosis of  By: Daryl FNP, Melissa S    Embolism - blood clot 2007   right leg hx of   Enlarged heart    Essential hypertension 01/29/2010   Qualifier: Diagnosis of  By: Joshua RN, Crystal    Last Assessment & Plan:  Formatting of this note might be different  from the original. Relevant Hx: Course: Daily Update: Today's Plan:stable for her and will follow this for her  Electronically signed by: Eleanor Merlynn Lady, NP 07/22/15 2113   Gastroesophageal reflux disease without esophagitis 07/18/2015   Last Assessment & Plan:  Formatting of this note might be different from the original. Relevant Hx: Course: Daily Update: Today's Plan:this is stable for her at this time and will follow her  Electronically signed by: Eleanor Merlynn Lady, NP 07/22/15 2116   GERD (gastroesophageal reflux disease)    GERD, SEVERE 02/01/2010   Qualifier: Diagnosis of  By: Brien MD, Belvie BRAVO    Hypertension    Hypothyroidism    HYPOTHYROIDISM 01/16/2010   Qualifier: Diagnosis of  By: Daryl FNP, Melissa S    LIPOMA 05/15/2010   Qualifier: Diagnosis of  By: Daryl FNP, Melissa S    Lumbar spondylosis 02/18/2017   Formatting of this note might be different from the original. Noted on ([facility]) CT scan 02/2017   Mixed hyperlipidemia 07/18/2015   Last Assessment & Plan:  Formatting of this note might be different from the original. Relevant Hx: Course: Daily Update: Today's Plan:she is going to have her lipids updated fasting  Electronically signed by:  Eleanor Merlynn Lady, NP 07/22/15 2112   OBESITY, MORBID 01/16/2010   Qualifier: Diagnosis of  By: Daryl FNP, Eleanor RAMAN   Formatting of this note might be different from the original. Overview:  Qualifier: Diagnosis of  By: Daryl FNP, Melissa S   Obstructive sleep apnea syndrome 01/16/2010   last sleep study over five years Formatting of this note might be different from the original. Overview:  Qualifier: Diagnosis of  By: Daryl FNP, Eleanor RAMAN  Last Assessment & Plan:  Formatting of this note might be different from the original. Relevant Hx: Course: Daily Update: Today's Plan:she cannot wear the mask for this and have discussed with her how this can affect her by not doing so     Osteopenia of multiple sites 06/02/2017   Formatting of this note might be different from the original. -2.3   Other acute sinusitis 01/29/2010   Qualifier: Diagnosis of  By: Brien MD, Belvie BRAVO    Prediabetes 07/22/2015   Last Assessment & Plan:  Formatting of this note might be different from the original. Relevant Hx: Course: Daily Update: Today's Plan:would update her HGBA1C for her and with her use of the steroids this can be elevated for her  Electronically signed by: Eleanor Merlynn Lady, NP 07/22/15 2109   Right rotator cuff tear 08/09/2014   Severe persistent asthma 01/09/2015   Kennisha presents today with a one week history of wheezing and DOE and slight cough requiring her to use a SABA a few times per day. In addition, has nasal congestion and green rhinorrhea. She has been off her prednisone  for over two months and was doing very well until this flare up. She continue on her Nucala  and her large collection of medication for inflammation and reflux. Her reflux is under c   Shortness of breath dyspnea    with exertion   Sleep apnea    last sleep study over five years   SLEEP APNEA, OBSTRUCTIVE 01/16/2010   Qualifier: Diagnosis of  By: Daryl FNP, Melissa S    Thyroid disease     hypothyroidism   Vitamin D deficiency 02/02/2010   Qualifier: Diagnosis of  By: Daryl FNP, Eleanor RAMAN   Formatting of this note might be different from the original. Overview:  Qualifier: Diagnosis of  By: Daryl FNP, Eleanor RAMAN    Past Surgical History:  Procedure Laterality Date   CARPAL TUNNEL RELEASE Right    EYE SURGERY Bilateral    cataract removal    KNEE SURGERY  2007   right knee   LIPOMA EXCISION Right 03/28/2019   Procedure: EXCISION SUBCUTANEOUS LIPOMA RIGHT SHOULDER;  Surgeon: Belinda Cough, MD;  Location: [facility] OR;  Service: General;  Laterality: Right;   SHOULDER ARTHROSCOPY WITH OPEN ROTATOR CUFF REPAIR AND DISTAL CLAVICLE ACROMINECTOMY Right 08/09/2014   Procedure: SHOULDER ARTHROSCOPY WITH OPEN ROTATOR CUFF REPAIR AND DISTAL CLAVICLE ACROMINECTOMY;  Surgeon: Toribio Silos, MD;  Location: [facility] OR;  Service: Orthopedics;  Laterality: Right;   SINUS SURGERY WITH INSTATRAK     ???    Review of systems negative except as noted in HPI / PMHx or noted below:  Review of Systems  Constitutional: Negative.   HENT: Negative.    Eyes: Negative.   Respiratory: Negative.    Cardiovascular: Negative.   Gastrointestinal: Negative.   Genitourinary: Negative.   Musculoskeletal: Negative.   Skin: Negative.   Neurological: Negative.   Endo/Heme/Allergies: Negative.   Psychiatric/Behavioral: Negative.       Objective:   Vitals:   12/29/23 1134  BP: 136/82  Pulse: 86  Resp: 16  SpO2: 97%          Physical Exam Constitutional:      Appearance: She is not diaphoretic.  HENT:     Head: Normocephalic.     Right Ear: Tympanic membrane, ear canal and external ear normal.     Left Ear: Tympanic membrane, ear canal and external ear normal.     Nose: Nose normal. No mucosal edema or rhinorrhea.     Mouth/Throat:     Pharynx: Uvula midline. No oropharyngeal exudate.  Eyes:     Conjunctiva/sclera: Conjunctivae normal.  Neck:     Thyroid: No thyromegaly.      Trachea: Trachea normal. No tracheal tenderness or tracheal deviation.  Cardiovascular:     Rate and Rhythm: Normal rate and regular rhythm.     Heart sounds: Normal heart sounds, S1 normal and S2 normal. No murmur heard. Pulmonary:     Effort: No respiratory distress.     Breath sounds: Normal breath sounds. No stridor. No wheezing or rales.  Lymphadenopathy:     Head:     Right side of head: No tonsillar adenopathy.     Left side of head: No tonsillar adenopathy.     Cervical: No cervical adenopathy.  Skin:    Findings: No erythema or rash.     Nails: There is no clubbing.  Neurological:     Mental Status: She is alert.     Diagnostics: Spirometry was performed and demonstrated an FEV1 of 1.22 at 58 % of predicted.  Assessment and Plan:   1. Asthma, severe persistent, well-controlled   2. Other allergic rhinitis   3. LPRD (laryngopharyngeal reflux disease)   4. Dyspnea on exertion   5. Class 3 severe obesity due to excess calories with serious comorbidity and body mass index (BMI) of 50.0 to 59.9 in adult     1. Continue Tezepelumab  injections monthly  2. Continue Breztri  - 2 inhalations 2 times per day  (empty lungs)  3. Continue nasonex  1 spray each nostril 1-2 times per day  4. Continue pantoprazole  40mg  2 times per day  5. Start Famotidine  40 mg - 1 tablet in evening  6. Start Wegovy  injections  7. If needed:   A. Albuterol  - 2 inhalations or DUONEB nebulizer every 4 hours    B. OTC antihistamine - Zyrtec / Allegra / Claritin , nasal saline   8. Return to clinic in 12 weeks or earlier if problem  9. Influenza = Tamiflu. Covid = Paxlovid  Gracieann appears to be having some issues with her reflux affecting her throat and will have her aggressively treat this issue by using her pantoprazole  twice a day and starting some famotidine  in the evening.  She is also having dyspnea on exertion which has been a longstanding issue along with back pain and knee pain and I  think the best way to approach this is to have her address her obesity by starting her on Wegovy .  She will remain on anti-inflammatory agents for airway including use of tezepelumab  and we will see her back in this clinic in 12 weeks or earlier if there is a problem.  Camellia Denis, MD Allergy / Immunology [facility]

## 2023-12-29 NOTE — Telephone Encounter (Signed)
 Your request has been approved CaseId:[id number];Status:Approved;Review Type:Prior Auth;Coverage Start Date:11/29/2023;Coverage End Date:07/26/2024; Authorization Expiration04/16/2026

## 2024-01-02 ENCOUNTER — Encounter: Payer: Self-pay | Admitting: Allergy and Immunology

## 2024-01-02 NOTE — Addendum Note (Signed)
 Addended by: Joseh Sjogren on: 01/02/2024 04:54 PM   Modules accepted: Orders

## 2024-01-03 ENCOUNTER — Ambulatory Visit

## 2024-01-03 ENCOUNTER — Ambulatory Visit (INDEPENDENT_AMBULATORY_CARE_PROVIDER_SITE_OTHER): Admitting: *Deleted

## 2024-01-03 DIAGNOSIS — J4550 Severe persistent asthma, uncomplicated: Secondary | ICD-10-CM

## 2024-01-09 ENCOUNTER — Ambulatory Visit

## 2024-01-31 ENCOUNTER — Ambulatory Visit

## 2024-01-31 DIAGNOSIS — J4550 Severe persistent asthma, uncomplicated: Secondary | ICD-10-CM | POA: Diagnosis not present

## 2024-02-17 ENCOUNTER — Encounter (HOSPITAL_BASED_OUTPATIENT_CLINIC_OR_DEPARTMENT_OTHER): Payer: Self-pay

## 2024-02-17 ENCOUNTER — Ambulatory Visit (HOSPITAL_BASED_OUTPATIENT_CLINIC_OR_DEPARTMENT_OTHER)
Admission: EM | Admit: 2024-02-17 | Discharge: 2024-02-17 | Disposition: A | Discharge status: Home / Self Care | Attending: Family Medicine | Admitting: Family Medicine

## 2024-02-17 DIAGNOSIS — J4521 Mild intermittent asthma with (acute) exacerbation: Secondary | ICD-10-CM | POA: Diagnosis not present

## 2024-02-17 MED ORDER — IPRATROPIUM-ALBUTEROL 0.5-2.5 (3) MG/3ML IN SOLN: RESPIRATORY_TRACT | 1 refills | Status: DC

## 2024-02-17 MED ORDER — PREDNISONE 20 MG PO TABS: 40.0000 mg | ORAL_TABLET | Freq: Every day | ORAL | 0 refills | Status: DC

## 2024-02-17 NOTE — ED Provider Notes (Signed)
 [facility]    CSN: [id number] Arrival date & time: 02/17/24  1213      History   Chief Complaint Chief Complaint  Patient presents with   Asthma    HPI Beth Rodriguez is a 63 y.o. female.   Patient reports hx of Asthma. Onset of worsening asthma symptoms x 5 days and unable to be seen by pcp. Patient using nebulizers but meds for nebulizer machine or significantly expired. Cough that is frequent.  No fever, chills, body aches, sore throat, ear pain.   Asthma    Past Medical History:  Diagnosis Date   Abnormal stress test 04/18/2017   Acquired hypothyroidism 07/18/2015   Last Assessment & Plan:  Formatting of this note might be different from the original. Relevant Hx: Course: Daily Update: Today's Plan:she is going to have her TSH updated for her   Electronically signed by: Eleanor Merlynn Lady, NP 07/22/15 2115   ADRENAL INSUFFICIENCY, HX OF 01/23/2010   Qualifier: Diagnosis of  By: Daryl FNP, Melissa S    Allergic rhinitis 12/12/2014   Aortic atherosclerosis 02/18/2017   Formatting of this note might be different from the original. Noted on CT scan ([facility]) 02/2017   Arthritis    Asthma    ASTHMA 01/16/2010   Qualifier: Diagnosis of  By: Daryl FNP, Melissa S    Cardiomegaly 01/16/2010   Qualifier: Diagnosis of  By: Daryl FNP, Melissa S    Carpal tunnel syndrome on right    Coronary artery disease 07/21/2017   With mildly abnormal stress test showing small defect involving apex indicating distal LAD disease  Formatting of this note might be different from the original. Overview:  With mildly abnormal stress test showing small defect involving apex indicating distal LAD disease   COUGH, CHRONIC 01/23/2010   Qualifier: Diagnosis of  By: Daryl FNP, Melissa S    DEEP VENOUS THROMBOPHLEBITIS, LEG, RIGHT, HX OF 01/16/2010   Qualifier: Diagnosis of  By: Daryl FNP, Melissa S    Degeneration of lumbar intervertebral disc 07/18/2015    Last Assessment & Plan:  Formatting of this note might be different from the original. Relevant Hx: Course: Daily Update: Today's Plan:longstanding for her and she is still working and on her feet some days being harder for her than others, she is to again work on her weight to help with this  Electronically signed by: Eleanor Merlynn Lady, NP 07/22/15 2113   DIASTOLIC DYSFUNCTION 01/29/2010   Annotation: grade 2 per 2-D echo 01/26/10 Qualifier: Diagnosis of  By: Daryl FNP, Melissa S    Diverticulosis 10/03/2017   DYSPNEA ON EXERTION 01/16/2010   Qualifier: Diagnosis of  By: Daryl FNP, Melissa S    Dyspnea on exertion 03/17/2017   Edema 01/16/2010   Qualifier: Diagnosis of  By: Daryl FNP, Melissa S    Embolism - blood clot 2007   right leg hx of   Enlarged heart    Essential hypertension 01/29/2010   Qualifier: Diagnosis of  By: Joshua RN, Crystal    Last Assessment & Plan:  Formatting of this note might be different from the original. Relevant Hx: Course: Daily Update: Today's Plan:stable for her and will follow this for her  Electronically signed by: Eleanor Merlynn Lady, NP 07/22/15 2113   Gastroesophageal reflux disease without esophagitis 07/18/2015   Last Assessment & Plan:  Formatting of this note might be different from the original. Relevant Hx: Course: Daily Update: Today's Plan:this is stable for her at this  time and will follow her  Electronically signed by: Eleanor Merlynn Lady, NP 07/22/15 2116   GERD (gastroesophageal reflux disease)    GERD, SEVERE 02/01/2010   Qualifier: Diagnosis of  By: Brien MD, Belvie BRAVO    Hypertension    Hypothyroidism    HYPOTHYROIDISM 01/16/2010   Qualifier: Diagnosis of  By: Daryl FNP, Melissa S    LIPOMA 05/15/2010   Qualifier: Diagnosis of  By: Daryl FNP, Melissa S    Lumbar spondylosis 02/18/2017   Formatting of this note might be different from the original. Noted on ( health) CT scan 02/2017   Mixed  hyperlipidemia 07/18/2015   Last Assessment & Plan:  Formatting of this note might be different from the original. Relevant Hx: Course: Daily Update: Today's Plan:she is going to have her lipids updated fasting  Electronically signed by: Eleanor Merlynn Lady, NP 07/22/15 2112   OBESITY, MORBID 01/16/2010   Qualifier: Diagnosis of  By: Daryl FNP, Eleanor RAMAN   Formatting of this note might be different from the original. Overview:  Qualifier: Diagnosis of  By: Daryl FNP, Melissa S   Obstructive sleep apnea syndrome 01/16/2010   last sleep study over five years Formatting of this note might be different from the original. Overview:  Qualifier: Diagnosis of  By: Daryl FNP, Eleanor RAMAN  Last Assessment & Plan:  Formatting of this note might be different from the original. Relevant Hx: Course: Daily Update: Today's Plan:she cannot wear the mask for this and have discussed with her how this can affect her by not doing so     Osteopenia of multiple sites 06/02/2017   Formatting of this note might be different from the original. -2.3   Other acute sinusitis 01/29/2010   Qualifier: Diagnosis of  By: Brien MD, Belvie BRAVO    Prediabetes 07/22/2015   Last Assessment & Plan:  Formatting of this note might be different from the original. Relevant Hx: Course: Daily Update: Today's Plan:would update her HGBA1C for her and with her use of the steroids this can be elevated for her  Electronically signed by: Eleanor Merlynn Lady, NP 07/22/15 2109   Right rotator cuff tear 08/09/2014   Severe persistent asthma (HCC) 01/09/2015   [name] presents today with a one week history of wheezing and DOE and slight cough requiring her to use a SABA a few times per day. In addition, has nasal congestion and green rhinorrhea. She has been off her prednisone  for over two months and was doing very well until this flare up. She continue on her Nucala  and her large collection of medication for inflammation and reflux. Her  reflux is under c   Shortness of breath dyspnea    with exertion   Sleep apnea    last sleep study over five years   SLEEP APNEA, OBSTRUCTIVE 01/16/2010   Qualifier: Diagnosis of  By: Daryl FNP, Melissa S    Thyroid disease    hypothyroidism   Vitamin D deficiency 02/02/2010   Qualifier: Diagnosis of  By: Daryl FNP, Eleanor RAMAN   Formatting of this note might be different from the original. Overview:  Qualifier: Diagnosis of  By: Daryl FNP, Eleanor S    Patient Active Problem List   Diagnosis Date Noted   Extensor tendonitis of foot 04/05/2023   Ingrowing left great toenail 04/05/2023   Primary osteoarthritis of both knees 10/22/2022   Chronic pain of both knees 10/11/2022   Constipation by delayed colonic transit 10/01/2022   Diastolic congestive heart  failure (HCC) 03/27/2020   Thyroid disease    Hypertension    Enlarged heart    Carpal tunnel syndrome on right    Arthritis    Diverticulosis 10/03/2017   Coronary artery disease 07/21/2017   Osteopenia of multiple sites 06/02/2017   Abnormal stress test 04/18/2017   Dyspnea on exertion 03/17/2017   Anginal equivalent 03/11/2017   Aortic atherosclerosis 02/18/2017   Lumbar spondylosis 02/18/2017   Renal cyst, right 02/18/2017   Plantar wart of both feet 10/14/2016   Postmenopausal 08/06/2016   Prediabetes 07/22/2015   Acquired hypothyroidism 07/18/2015   Gastroesophageal reflux disease without esophagitis 07/18/2015   Degeneration of lumbar intervertebral disc 07/18/2015   Mixed hyperlipidemia 07/18/2015   Severe obesity (BMI >= 40) (HCC) 07/18/2015   High risk medication use 07/18/2015   Severe persistent asthma (HCC) 01/09/2015   Allergic rhinitis 12/12/2014   Lipoma 05/15/2010   Vitamin D deficiency 02/02/2010   GERD, SEVERE 02/01/2010   Essential hypertension 01/29/2010   DIASTOLIC DYSFUNCTION 01/29/2010   OTHER ACUTE SINUSITIS 01/29/2010   COUGH, CHRONIC 01/23/2010   ADRENAL INSUFFICIENCY, HX OF  01/23/2010   HYPOTHYROIDISM 01/16/2010   OBESITY, MORBID 01/16/2010   SLEEP APNEA, OBSTRUCTIVE 01/16/2010   CARDIOMEGALY 01/16/2010   ASTHMA 01/16/2010   EDEMA 01/16/2010   DYSPNEA ON EXERTION 01/16/2010   DEEP VENOUS THROMBOPHLEBITIS, LEG, RIGHT, HX OF 01/16/2010   Obstructive sleep apnea syndrome 01/16/2010    Past Surgical History:  Procedure Laterality Date   CARPAL TUNNEL RELEASE Right    EYE SURGERY Bilateral    cataract removal    KNEE SURGERY  2007   right knee   LIPOMA EXCISION Right 03/28/2019   Procedure: EXCISION SUBCUTANEOUS LIPOMA RIGHT SHOULDER;  Surgeon: Belinda Cough, MD;  Location: [facility] OR;  Service: General;  Laterality: Right;   SHOULDER ARTHROSCOPY WITH OPEN ROTATOR CUFF REPAIR AND DISTAL CLAVICLE ACROMINECTOMY Right 08/09/2014   Procedure: SHOULDER ARTHROSCOPY WITH OPEN ROTATOR CUFF REPAIR AND DISTAL CLAVICLE ACROMINECTOMY;  Surgeon: Toribio Silos, MD;  Location: [facility] OR;  Service: Orthopedics;  Laterality: Right;   SINUS SURGERY WITH INSTATRAK     ???    OB History   No obstetric history on file.      Home Medications    Prior to Admission medications   Medication Sig Start Date End Date Taking? Authorizing Provider  predniSONE  (DELTASONE ) 20 MG tablet Take 2 tablets (40 mg total) by mouth daily with breakfast for 5 days. 02/17/24 02/22/24 Yes Maimuna Leaman A, FNP  albuterol  (PROVENTIL ) (2.5 MG/3ML) 0.083% nebulizer solution Take 2.5 mg by nebulization every 6 (six) hours as needed for wheezing or shortness of breath.    [provider]  albuterol  (VENTOLIN  HFA) 108 (90 Base) MCG/ACT inhaler CAN INHALE TWO PUFFS EVERY FOUR TO SIX HOURS AS NEEDED FOR COUGH OR WHEEZE. 11/28/23   Kozlow, Camellia PARAS, MD  aspirin EC 81 MG tablet Take 81 mg by mouth daily.     [provider]  atorvastatin  (LIPITOR) 10 MG tablet Take 1 tablet (10 mg total) by mouth daily. 06/10/23   Carlin Delon BROCKS, NP  budeson-glycopyrrolate -formoterol  (BREZTRI  AEROSPHERE) 160-9-4.8  MCG/ACT AERO inhaler INHALE TWO PUFFS TWICE DAILY TO PREVENT COUGH OR WHEEZE. RINSE, GARGLE, AND SPIT AFTER USE. 07/27/23   Kozlow, Camellia PARAS, MD  famotidine  (PEPCID ) 40 MG tablet Take 1 tablet (40 mg total) by mouth every evening. 12/29/23   Kozlow, Camellia PARAS, MD  furosemide (LASIX) 20 MG tablet Take 20 mg by mouth daily.  [provider]  ibuprofen (ADVIL) 800 MG tablet Take one tablet once or twice daily with food 04/05/23   [provider]  ipratropium-albuterol  (DUONEB) 0.5-2.5 (3) MG/3ML SOLN Can use one vial in nebulizer every four to six hours as needed for cough or wheeze. 02/17/24   Adah Wilbert LABOR, FNP  levothyroxine  (SYNTHROID , LEVOTHROID) 88 MCG tablet Take 88 mcg by mouth daily before breakfast.  01/05/15   [provider]  loratadine  (CLARITIN ) 10 MG tablet Take 10 mg by mouth daily as needed for allergies. Reported on 07/21/2015    [provider]  losartan  (COZAAR ) 100 MG tablet Take 100 mg by mouth daily. 07/22/14   [provider]  mometasone  (NASONEX  24HR) 50 MCG/ACT nasal spray Use one spray in each nostril one to two times per day. 12/29/23   Kozlow, Camellia PARAS, MD  pantoprazole  (PROTONIX ) 40 MG tablet Take 40 mg by mouth daily.    [provider]  Propylene Glycol (SYSTANE COMPLETE) 0.6 % SOLN Place 1 drop into both eyes daily as needed (dry eyes).    [provider]  semaglutide -weight management (WEGOVY ) 0.25 MG/0.5ML SOAJ SQ injection Inject 0.25 mg into the skin once a week. 12/29/23   Kozlow, Camellia PARAS, MD  TEZSPIRE  210 MG/1. syringe INJECT 210 MG UNDER THE SKIN EVERY 4 WEEKS (TO BE ADMINISTERED BY A HEALTH CARE PROVIDER IN A HEALTH CARE SETTING, DISCONTINUE FASENRA ) 06/17/23   Kozlow, Camellia PARAS, MD    Family History Family History  Problem Relation Age of Onset   Hypertension Mother    Hypertension Sister    Diabetes Sister    Hyperlipidemia Sister    Cancer Sister        skin cancer   Carpal tunnel syndrome Sister     Hypertension Daughter    Carpal tunnel syndrome Daughter    Hypertension Brother     Social History Social History   Tobacco Use   Smoking status: Never   Smokeless tobacco: Never  Vaping Use   Vaping status: Never Used  Substance Use Topics   Alcohol use: No   Drug use: No     Allergies   Penicillins, Theophyllines, Meloxicam, and Theophylline   Review of Systems Review of Systems See HPI  Physical Exam Triage Vital Signs ED Triage Vitals  Encounter Vitals Group     BP 02/17/24 1227 (!) 147/86     Girls Systolic BP Percentile --      Girls Diastolic BP Percentile --      Boys Systolic BP Percentile --      Boys Diastolic BP Percentile --      Pulse Rate 02/17/24 1227 79     Resp 02/17/24 1227 20     Temp 02/17/24 1227 98.6 F (37 C)     Temp Source 02/17/24 1227 Oral     SpO2 02/17/24 1227 95 %     Weight --      Height --      Head Circumference --      Peak Flow --      Pain Score 02/17/24 1229 0     Pain Loc --      Pain Education --      Exclude from Growth Chart --    No data found.  Updated Vital Signs BP (!) 147/86   Pulse 79   Temp 98.6 F (37 C) (Oral)   Resp 20   SpO2 95%   Visual Acuity Right Eye Distance:  Left Eye Distance:   Bilateral Distance:    Right Eye Near:   Left Eye Near:    Bilateral Near:     Physical Exam Vitals and nursing note reviewed.  Constitutional:      General: She is not in acute distress.    Appearance: Normal appearance. She is not ill-appearing, toxic-appearing or diaphoretic.  HENT:     Mouth/Throat:     Pharynx: Oropharynx is clear.  Eyes:     Conjunctiva/sclera: Conjunctivae normal.  Cardiovascular:     Rate and Rhythm: Normal rate and regular rhythm.  Pulmonary:     Effort: Pulmonary effort is normal.     Breath sounds: Wheezing present.  Musculoskeletal:        General: Normal range of motion.  Skin:    General: Skin is warm and dry.  Neurological:     Mental Status: She is alert.   Psychiatric:        Mood and Affect: Mood normal.      UC Treatments / Results  Labs (all labs ordered are listed, but only abnormal results are displayed) Labs Reviewed - No data to display  EKG   Radiology No results found.  Procedures Procedures (including critical care time)  Medications Ordered in UC Medications - No data to display  Initial Impression / Assessment and Plan / UC Course  I have reviewed the triage vital signs and the nursing notes.  Pertinent labs & imaging results that were available during my care of the patient were reviewed by me and considered in my medical decision making (see chart for details).    Mild intermittent asthma with acute exacerbation.  Treating the exacerbation with prednisone  taper over the next 5 days.  Also refilled her DuoNeb nebulizer to use at home.  Recommend allergy medication to include Zyrtec or Claritin .  Follow-up as needed  Final Clinical Impressions(s) / UC Diagnoses   Final diagnoses:  Mild intermittent asthma with acute exacerbation     Discharge Instructions      Take the prednisone  as prescribed.  Daily in the morning with food.  Use your albuterol  DuoNeb nebulizer as needed. Follow-up as needed   ED Prescriptions     Medication Sig Dispense Auth. Provider   predniSONE  (DELTASONE ) 20 MG tablet Take 2 tablets (40 mg total) by mouth daily with breakfast for 5 days. 10 tablet Tameisha Covell A, FNP   ipratropium-albuterol  (DUONEB) 0.5-2.5 (3) MG/3ML SOLN Can use one vial in nebulizer every four to six hours as needed for cough or wheeze. 180 mL Adah Corning A, FNP      PDMP not reviewed this encounter.   Adah Corning LABOR, FNP 02/17/24 1249

## 2024-02-17 NOTE — ED Triage Notes (Signed)
 Patient reports hx of Asthma. Onset of worsening asthma symptoms x 5 days and unable to be seen by pcp. Patient using nebulizers but meds for nebulizer machine or significantly expired. Patient is obese and ambulation appears to create shortness of breath.

## 2024-02-17 NOTE — Discharge Instructions (Addendum)
 Take the prednisone  as prescribed.  Daily in the morning with food.  Use your albuterol  DuoNeb nebulizer as needed. Follow-up as needed

## 2024-02-20 ENCOUNTER — Ambulatory Visit: Admitting: Allergy and Immunology

## 2024-02-20 ENCOUNTER — Encounter: Payer: Self-pay | Admitting: Allergy and Immunology

## 2024-02-20 VITALS — BP 136/84 | HR 108 | Resp 16

## 2024-02-20 DIAGNOSIS — B9789 Other viral agents as the cause of diseases classified elsewhere: Secondary | ICD-10-CM

## 2024-02-20 DIAGNOSIS — J988 Other specified respiratory disorders: Secondary | ICD-10-CM | POA: Diagnosis not present

## 2024-02-20 DIAGNOSIS — K219 Gastro-esophageal reflux disease without esophagitis: Secondary | ICD-10-CM | POA: Diagnosis not present

## 2024-02-20 DIAGNOSIS — J3089 Other allergic rhinitis: Secondary | ICD-10-CM

## 2024-02-20 DIAGNOSIS — J4550 Severe persistent asthma, uncomplicated: Secondary | ICD-10-CM

## 2024-02-20 MED ORDER — METHYLPREDNISOLONE ACETATE 80 MG/ML IJ SUSP
80.0000 mg | Freq: Once | INTRAMUSCULAR | Status: AC
  Administered 2024-02-20: 80 mg via INTRAMUSCULAR

## 2024-02-20 MED ORDER — HYDROCOD POLI-CHLORPHE POLI ER 10-8 MG/5ML PO SUER: 2.5000 mL | Freq: Two times a day (BID) | ORAL | 0 refills | Status: DC | PRN

## 2024-02-20 NOTE — Progress Notes (Unsigned)
 [place] [facility] [place] - [place] - [place] - [place]   Follow-up Note  Referring Provider: Benson Eleanor PARAS* Primary Provider: Benson Eleanor Rung, NP Date of Office Visit: 02/20/2024  Subjective:   Beth Rodriguez (DOB: 1960-09-12) is a 63 y.o. female who [facility] in re-evaluation of the following:  HPI: Beth Rodriguez returns to this clinic in evaluation of severe asthma, dyspnea on exertion, obesity, allergic rhinitis, LPR.  I last saw her in this clinic 29 December 2023.  She unfortunately became sick approximately 1 week ago with a sore throat which has since devolved into shortness of breath and coughing and she went to the urgent care center 3 days ago and was started on prednisone  and 2 days ago she was started on azithromycin .  She is coughing like crazy and she is having disturbed sleep.  When I last saw her in this clinic she was having problems with reflux and we gave her famotidine  and that really work much better in addition to her pantoprazole .  As well, we started her on Wegovy  and she did lose weight for 2 weeks but then she developed diverticulitis and had to discontinue this agent.  Allergies as of 02/20/2024       Reactions   Penicillins Hives, Other (See Comments)   Did it involve swelling of the face/tongue/throat, SOB, or low BP? No Did it involve sudden or severe rash/hives, skin peeling, or any reaction on the inside of your mouth or nose?    #  #  YES  #  #  Did you need to seek medical attention at a hospital or doctor's office?    #  #  YES  #  #  When did it last happen?     2019    Theophyllines Other (See Comments)   HEADACHE Severe headaches   Meloxicam    Upset stomach   Theophylline Other (See Comments)   REACTION: headaches        Medication List    albuterol  (2.5 MG/3ML) 0.083% nebulizer solution Commonly known as: PROVENTIL  Take 2.5 mg by nebulization every 6 (six) hours as  needed for wheezing or shortness of breath.   albuterol  108 (90 Base) MCG/ACT inhaler Commonly known as: VENTOLIN  HFA CAN INHALE TWO PUFFS EVERY FOUR TO SIX HOURS AS NEEDED FOR COUGH OR WHEEZE.   aspirin EC 81 MG tablet Take 81 mg by mouth daily.   atorvastatin  10 MG tablet Commonly known as: LIPITOR Take 1 tablet (10 mg total) by mouth daily.   Breztri  Aerosphere 160-9-4.8 MCG/ACT Aero inhaler Generic drug: budesonide -glycopyrrolate -formoterol  INHALE TWO PUFFS TWICE DAILY TO PREVENT COUGH OR WHEEZE. RINSE, GARGLE, AND SPIT AFTER USE.   famotidine  40 MG tablet Commonly known as: PEPCID  Take 1 tablet (40 mg total) by mouth every evening.   furosemide 20 MG tablet Commonly known as: LASIX Take 20 mg by mouth daily.   ibuprofen 800 MG tablet Commonly known as: ADVIL Take one tablet once or twice daily with food   ipratropium-albuterol  0.5-2.5 (3) MG/3ML Soln Commonly known as: DUONEB Can use one vial in nebulizer every four to six hours as needed for cough or wheeze.   levothyroxine  88 MCG tablet Commonly known as: SYNTHROID  Take 88 mcg by mouth daily before breakfast.   loratadine  10 MG tablet Commonly known as: CLARITIN  Take 10 mg by mouth daily as needed for allergies. Reported on 07/21/2015   losartan  100 MG tablet Commonly known  as: COZAAR  Take 100 mg by mouth daily.   mometasone  50 MCG/ACT nasal spray Commonly known as: Nasonex  24HR Use one spray in each nostril one to two times per day.   pantoprazole  40 MG tablet Commonly known as: PROTONIX  Take 40 mg by mouth daily.   predniSONE  20 MG tablet Commonly known as: DELTASONE  Take 2 tablets (40 mg total) by mouth daily with breakfast for 5 days.   Systane Complete 0.6 % Soln Generic drug: Propylene Glycol Place 1 drop into both eyes daily as needed (dry eyes).   Tezspire  210 MG/1. syringe Generic drug: tezepelumab -ekko INJECT 210 MG UNDER THE SKIN EVERY 4 WEEKS (TO BE ADMINISTERED BY A HEALTH CARE  PROVIDER IN A HEALTH CARE SETTING, DISCONTINUE FASENRA )    Past Medical History:  Diagnosis Date   Abnormal stress test 04/18/2017   Acquired hypothyroidism 07/18/2015   Last Assessment & Plan:  Formatting of this note might be different from the original. Relevant Hx: Course: Daily Update: Today's Plan:she is going to have her TSH updated for her   Electronically signed by: Eleanor Merlynn Lady, NP 07/22/15 2115   ADRENAL INSUFFICIENCY, HX OF 01/23/2010   Qualifier: Diagnosis of  By: Daryl FNP, Melissa S    Allergic rhinitis 12/12/2014   Aortic atherosclerosis 02/18/2017   Formatting of this note might be different from the original. Noted on CT scan ([facility]) 02/2017   Arthritis    Asthma    ASTHMA 01/16/2010   Qualifier: Diagnosis of  By: Daryl FNP, Melissa S    Cardiomegaly 01/16/2010   Qualifier: Diagnosis of  By: Daryl FNP, Melissa S    Carpal tunnel syndrome on right    Coronary artery disease 07/21/2017   With mildly abnormal stress test showing small defect involving apex indicating distal LAD disease  Formatting of this note might be different from the original. Overview:  With mildly abnormal stress test showing small defect involving apex indicating distal LAD disease   COUGH, CHRONIC 01/23/2010   Qualifier: Diagnosis of  By: Daryl FNP, Melissa S    DEEP VENOUS THROMBOPHLEBITIS, LEG, RIGHT, HX OF 01/16/2010   Qualifier: Diagnosis of  By: Daryl FNP, Melissa S    Degeneration of lumbar intervertebral disc 07/18/2015   Last Assessment & Plan:  Formatting of this note might be different from the original. Relevant Hx: Course: Daily Update: Today's Plan:longstanding for her and she is still working and on her feet some days being harder for her than others, she is to again work on her weight to help with this  Electronically signed by: Eleanor Merlynn Lady, NP 07/22/15 2113   DIASTOLIC DYSFUNCTION 01/29/2010   Annotation: grade 2 per 2-D echo  01/26/10 Qualifier: Diagnosis of  By: Daryl FNP, Melissa S    Diverticulosis 10/03/2017   DYSPNEA ON EXERTION 01/16/2010   Qualifier: Diagnosis of  By: Daryl FNP, Melissa S    Dyspnea on exertion 03/17/2017   Edema 01/16/2010   Qualifier: Diagnosis of  By: Daryl FNP, Melissa S    Embolism - blood clot 2007   right leg hx of   Enlarged heart    Essential hypertension 01/29/2010   Qualifier: Diagnosis of  By: Joshua RN, Crystal    Last Assessment & Plan:  Formatting of this note might be different from the original. Relevant Hx: Course: Daily Update: Today's Plan:stable for her and will follow this for her  Electronically signed by: Eleanor Merlynn Lady, NP 07/22/15 2113   Gastroesophageal reflux disease without  esophagitis 07/18/2015   Last Assessment & Plan:  Formatting of this note might be different from the original. Relevant Hx: Course: Daily Update: Today's Plan:this is stable for her at this time and will follow her  Electronically signed by: Eleanor Merlynn Lady, NP 07/22/15 2116   GERD (gastroesophageal reflux disease)    GERD, SEVERE 02/01/2010   Qualifier: Diagnosis of  By: Brien MD, Belvie BRAVO    Hypertension    Hypothyroidism    HYPOTHYROIDISM 01/16/2010   Qualifier: Diagnosis of  By: Daryl FNP, Melissa S    LIPOMA 05/15/2010   Qualifier: Diagnosis of  By: Daryl FNP, Melissa S    Lumbar spondylosis 02/18/2017   Formatting of this note might be different from the original. Noted on ([facility]) CT scan 02/2017   Mixed hyperlipidemia 07/18/2015   Last Assessment & Plan:  Formatting of this note might be different from the original. Relevant Hx: Course: Daily Update: Today's Plan:she is going to have her lipids updated fasting  Electronically signed by: Eleanor Merlynn Lady, NP 07/22/15 2112   OBESITY, MORBID 01/16/2010   Qualifier: Diagnosis of  By: Daryl FNP, Eleanor RAMAN   Formatting of this note might be different from the original.  Overview:  Qualifier: Diagnosis of  By: Daryl FNP, Melissa S   Obstructive sleep apnea syndrome 01/16/2010   last sleep study over five years Formatting of this note might be different from the original. Overview:  Qualifier: Diagnosis of  By: Daryl FNP, Eleanor RAMAN  Last Assessment & Plan:  Formatting of this note might be different from the original. Relevant Hx: Course: Daily Update: Today's Plan:she cannot wear the mask for this and have discussed with her how this can affect her by not doing so     Osteopenia of multiple sites 06/02/2017   Formatting of this note might be different from the original. -2.3   Other acute sinusitis 01/29/2010   Qualifier: Diagnosis of  By: Brien MD, Belvie BRAVO    Prediabetes 07/22/2015   Last Assessment & Plan:  Formatting of this note might be different from the original. Relevant Hx: Course: Daily Update: Today's Plan:would update her HGBA1C for her and with her use of the steroids this can be elevated for her  Electronically signed by: Eleanor Merlynn Lady, NP 07/22/15 2109   Right rotator cuff tear 08/09/2014   Severe persistent asthma (HCC) 01/09/2015   Beth Rodriguez presents today with a one week history of wheezing and DOE and slight cough requiring her to use a SABA a few times per day. In addition, has nasal congestion and green rhinorrhea. She has been off her prednisone  for over two months and was doing very well until this flare up. She continue on her Nucala  and her large collection of medication for inflammation and reflux. Her reflux is under c   Shortness of breath dyspnea    with exertion   Sleep apnea    last sleep study over five years   SLEEP APNEA, OBSTRUCTIVE 01/16/2010   Qualifier: Diagnosis of  By: Daryl FNP, Melissa S    Thyroid disease    hypothyroidism   Vitamin D deficiency 02/02/2010   Qualifier: Diagnosis of  By: Daryl FNP, Eleanor RAMAN   Formatting of this note might be different from the original. Overview:  Qualifier:  Diagnosis of  By: Daryl FNP, Eleanor RAMAN    Past Surgical History:  Procedure Laterality Date   CARPAL TUNNEL RELEASE Right    EYE SURGERY Bilateral  cataract removal    KNEE SURGERY  2007   right knee   LIPOMA EXCISION Right 03/28/2019   Procedure: EXCISION SUBCUTANEOUS LIPOMA RIGHT SHOULDER;  Surgeon: Belinda Cough, MD;  Location: [facility] OR;  Service: General;  Laterality: Right;   SHOULDER ARTHROSCOPY WITH OPEN ROTATOR CUFF REPAIR AND DISTAL CLAVICLE ACROMINECTOMY Right 08/09/2014   Procedure: SHOULDER ARTHROSCOPY WITH OPEN ROTATOR CUFF REPAIR AND DISTAL CLAVICLE ACROMINECTOMY;  Surgeon: Toribio Silos, MD;  Location: [facility] OR;  Service: Orthopedics;  Laterality: Right;   SINUS SURGERY WITH INSTATRAK     ???    Review of systems negative except as noted in HPI / PMHx or noted below:  Review of Systems  Constitutional: Negative.   HENT: Negative.    Eyes: Negative.   Respiratory: Negative.    Cardiovascular: Negative.   Gastrointestinal: Negative.   Genitourinary: Negative.   Musculoskeletal: Negative.   Skin: Negative.   Neurological: Negative.   Endo/Heme/Allergies: Negative.   Psychiatric/Behavioral: Negative.       Objective:   Vitals:   02/20/24 1159  BP: 136/84  Pulse: (!) 108  Resp: 16  SpO2: 99%          Physical Exam Constitutional:      Appearance: She is not diaphoretic.     Comments: coughing  HENT:     Head: Normocephalic.     Right Ear: Tympanic membrane, ear canal and external ear normal.     Left Ear: Tympanic membrane, ear canal and external ear normal.     Nose: Nose normal. No mucosal edema or rhinorrhea.     Mouth/Throat:     Pharynx: Uvula midline. No oropharyngeal exudate.  Eyes:     Conjunctiva/sclera: Conjunctivae normal.  Neck:     Thyroid: No thyromegaly.     Trachea: Trachea normal. No tracheal tenderness or tracheal deviation.  Cardiovascular:     Rate and Rhythm: Normal rate and regular rhythm.     Heart sounds: Normal  heart sounds, S1 normal and S2 normal. No murmur heard. Pulmonary:     Effort: No respiratory distress.     Breath sounds: Normal breath sounds. No stridor. No wheezing (deep bronchial vibratory noise with cough) or rales.  Lymphadenopathy:     Head:     Right side of head: No tonsillar adenopathy.     Left side of head: No tonsillar adenopathy.     Cervical: No cervical adenopathy.  Skin:    Findings: No erythema or rash.     Nails: There is no clubbing.  Neurological:     Mental Status: She is alert.     Diagnostics: none   Assessment and Plan:   1. Not well controlled severe persistent asthma (HCC)   2. Other allergic rhinitis   3. LPRD (laryngopharyngeal reflux disease)   4. Viral respiratory infection   5. Asthma, severe persistent, well-controlled (HCC)     1. Continue Tezepelumab  injections monthly  2. Continue Breztri  - 2 inhalations 2 times per day  (empty lungs)  3. Continue nasonex  1 spray each nostril 1-2 times per day  4. Continue pantoprazole  40mg  2 times per day + Famotidine  40 mg - 1 tablet in evening  5. If needed:   A. Albuterol  - 2 inhalations or DUONEB nebulizer every 4 hours    B. OTC antihistamine - Zyrtec / Allegra / Claritin , nasal saline   6. For this recent event:   A. Depomedrol 80 mg IM delivered in clinic today  B. Tussionex - 2.5-5.0  mls every 12 hours if needed for cough, 60 mls (NARCOTIC)  C. Continue azithromycin    7. Return to clinic in 12 weeks or earlier if problem  8. Influenza = Tamiflu. Covid = Paxlovid  Beth Rodriguez is obviously infected and this is giving rise to significant inflammation of her airway.  Will treat her with a Depo-Medrol  injection and she can continue her prescription of azithromycin  and I given her some cough suppressants and she will maintain therapy directed against her respiratory tract inflammation with treatment noted above as well as also addressing her issue with reflux with the treatment noted above.   Assuming she does well we will see her back in this clinic in 12 weeks or earlier if there is a problem.  Beth Denis, MD Allergy / Immunology [facility]

## 2024-02-20 NOTE — Patient Instructions (Addendum)
  1. Continue Tezepelumab  injections monthly  2. Continue Breztri  - 2 inhalations 2 times per day  (empty lungs)  3. Continue nasonex  1 spray each nostril 1-2 times per day  4. Continue pantoprazole  40mg  2 times per day + Famotidine  40 mg - 1 tablet in evening  5. If needed:   A. Albuterol  - 2 inhalations or DUONEB nebulizer every 4 hours    B. OTC antihistamine - Zyrtec / Allegra / Claritin , nasal saline   6. For this recent event:   A. Depomedrol 80 mg IM delivered in clinic today  B. Tussionex - 2.5-5.0 mls every 12 hours if needed for cough, 60 mls (NARCOTIC)  C. Continue azithromycin    7. Return to clinic in 12 weeks or earlier if problem  8. Influenza = Tamiflu. Covid = Paxlovid

## 2024-02-22 ENCOUNTER — Encounter: Payer: Self-pay | Admitting: Allergy and Immunology

## 2024-02-23 ENCOUNTER — Encounter: Payer: Self-pay | Admitting: Allergy and Immunology

## 2024-02-23 ENCOUNTER — Ambulatory Visit: Admitting: Allergy and Immunology

## 2024-02-23 VITALS — BP 146/86 | HR 91 | Resp 16

## 2024-02-23 DIAGNOSIS — K219 Gastro-esophageal reflux disease without esophagitis: Secondary | ICD-10-CM | POA: Diagnosis not present

## 2024-02-23 DIAGNOSIS — J3089 Other allergic rhinitis: Secondary | ICD-10-CM | POA: Diagnosis not present

## 2024-02-23 DIAGNOSIS — B9789 Other viral agents as the cause of diseases classified elsewhere: Secondary | ICD-10-CM

## 2024-02-23 DIAGNOSIS — J988 Other specified respiratory disorders: Secondary | ICD-10-CM

## 2024-02-23 DIAGNOSIS — J4550 Severe persistent asthma, uncomplicated: Secondary | ICD-10-CM | POA: Diagnosis not present

## 2024-02-23 MED ORDER — IPRATROPIUM-ALBUTEROL 0.5-2.5 (3) MG/3ML IN SOLN: RESPIRATORY_TRACT | 1 refills | Status: AC

## 2024-02-23 MED ORDER — BUDESONIDE 0.5 MG/2ML IN SUSP: RESPIRATORY_TRACT | 3 refills | Status: DC

## 2024-02-23 NOTE — Patient Instructions (Addendum)
  1. Continue Tezepelumab  injections monthly  2. Continue Breztri  - 2 inhalations 2 times per day  (empty lungs)  3. Continue nasonex  1 spray each nostril 1-2 times per day  4. Continue pantoprazole  40mg  2 times per day + Famotidine  40 mg - 1 tablet in evening  5. If needed:   A. Albuterol  - 2 inhalations or DUONEB nebulizer every 4 hours    B. OTC antihistamine - Zyrtec / Allegra / Claritin , nasal saline   6. For this recent event:   A. Duoneb + budesonide  0.5 - nebulize every 4-6 hours  B. Tussionex -2.5-5.0 mL every 12 hours if needed for cough, 60 mL  7. Return to clinic in 12 weeks or earlier if problem  8. Influenza = Tamiflu. Covid = Paxlovid

## 2024-02-23 NOTE — Progress Notes (Signed)
 [place] [facility] [place] - [place] - [place]   Follow-up Note  Referring Provider: Benson Eleanor PARAS* Primary Provider: Benson Eleanor Rung, NP Date of Office Visit: 02/23/2024  Subjective:   Beth Rodriguez (DOB: 07/20/1960) is a 63 y.o. female who [facility] in re-evaluation of the following:  HPI: Beth Rodriguez presents to this clinic in evaluation of severe asthma complicated by respiratory tract infection.  I last saw her in this clinic 20 February 2024.  She continues to cough and wheeze and she cannot go to work and her cough is disturbing her sleep although she does take Tussionex at night and this definitely does help with her nocturnal cough.  She has not really developed any new issues.  She does not have any chest pain she does not have any sputum production and she has not had any significant upper airway symptoms.  She is just not improving.  Allergies as of 02/23/2024       Reactions   Penicillins Hives, Other (See Comments)   Did it involve swelling of the face/tongue/throat, SOB, or low BP? No Did it involve sudden or severe rash/hives, skin peeling, or any reaction on the inside of your mouth or nose?    #  #  YES  #  #  Did you need to seek medical attention at a hospital or doctor's office?    #  #  YES  #  #  When did it last happen?     2019    Theophyllines Other (See Comments)   HEADACHE Severe headaches   Meloxicam    Upset stomach   Theophylline Other (See Comments)   REACTION: headaches        Medication List    albuterol  (2.5 MG/3ML) 0.083% nebulizer solution Commonly known as: PROVENTIL  Take 2.5 mg by nebulization every 6 (six) hours as needed for wheezing or shortness of breath.   albuterol  108 (90 Base) MCG/ACT inhaler Commonly known as: VENTOLIN  HFA CAN INHALE TWO PUFFS EVERY FOUR TO SIX HOURS AS NEEDED FOR COUGH OR WHEEZE.   aspirin EC 81 MG tablet Take 81 mg by mouth  daily.   atorvastatin  10 MG tablet Commonly known as: LIPITOR Take 1 tablet (10 mg total) by mouth daily.   Breztri  Aerosphere 160-9-4.8 MCG/ACT Aero inhaler Generic drug: budesonide -glycopyrrolate -formoterol  INHALE TWO PUFFS TWICE DAILY TO PREVENT COUGH OR WHEEZE. RINSE, GARGLE, AND SPIT AFTER USE.   chlorpheniramine-HYDROcodone  10-8 MG/5ML Commonly known as: TUSSIONEX Take 2.5-5 mLs by mouth every 12 (twelve) hours as needed for cough.   famotidine  40 MG tablet Commonly known as: PEPCID  Take 1 tablet (40 mg total) by mouth every evening.   furosemide 20 MG tablet Commonly known as: LASIX Take 20 mg by mouth daily.   ibuprofen 800 MG tablet Commonly known as: ADVIL Take one tablet once or twice daily with food   ipratropium-albuterol  0.5-2.5 (3) MG/3ML Soln Commonly known as: DUONEB Can use one vial in nebulizer every four to six hours as needed for cough or wheeze.   levothyroxine  88 MCG tablet Commonly known as: SYNTHROID  Take 88 mcg by mouth daily before breakfast.   loratadine  10 MG tablet Commonly known as: CLARITIN  Take 10 mg by mouth daily as needed for allergies. Reported on 07/21/2015   losartan  100 MG tablet Commonly known as: COZAAR  Take 100 mg by mouth daily.   mometasone  50 MCG/ACT nasal spray Commonly known as: Nasonex  24HR Use one  spray in each nostril one to two times per day.   pantoprazole  40 MG tablet Commonly known as: PROTONIX  Take 40 mg by mouth daily.   Systane Complete 0.6 % Soln Generic drug: Propylene Glycol Place 1 drop into both eyes daily as needed (dry eyes).   Tezspire  210 MG/1. syringe Generic drug: tezepelumab -ekko INJECT 210 MG UNDER THE SKIN EVERY 4 WEEKS (TO BE ADMINISTERED BY A HEALTH CARE PROVIDER IN A HEALTH CARE SETTING, DISCONTINUE FASENRA )    Past Medical History:  Diagnosis Date   Abnormal stress test 04/18/2017   Acquired hypothyroidism 07/18/2015   Last Assessment & Plan:  Formatting of this note might be  different from the original. Relevant Hx: Course: Daily Update: Today's Plan:she is going to have her TSH updated for her   Electronically signed by: Eleanor Merlynn Lady, NP 07/22/15 2115   ADRENAL INSUFFICIENCY, HX OF 01/23/2010   Qualifier: Diagnosis of  By: Daryl FNP, Melissa S    Allergic rhinitis 12/12/2014   Aortic atherosclerosis 02/18/2017   Formatting of this note might be different from the original. Noted on CT scan ([facility]) 02/2017   Arthritis    Asthma    ASTHMA 01/16/2010   Qualifier: Diagnosis of  By: Daryl FNP, Melissa S    Cardiomegaly 01/16/2010   Qualifier: Diagnosis of  By: Daryl FNP, Melissa S    Carpal tunnel syndrome on right    Coronary artery disease 07/21/2017   With mildly abnormal stress test showing small defect involving apex indicating distal LAD disease  Formatting of this note might be different from the original. Overview:  With mildly abnormal stress test showing small defect involving apex indicating distal LAD disease   COUGH, CHRONIC 01/23/2010   Qualifier: Diagnosis of  By: Daryl FNP, Melissa S    DEEP VENOUS THROMBOPHLEBITIS, LEG, RIGHT, HX OF 01/16/2010   Qualifier: Diagnosis of  By: Daryl FNP, Melissa S    Degeneration of lumbar intervertebral disc 07/18/2015   Last Assessment & Plan:  Formatting of this note might be different from the original. Relevant Hx: Course: Daily Update: Today's Plan:longstanding for her and she is still working and on her feet some days being harder for her than others, she is to again work on her weight to help with this  Electronically signed by: Eleanor Merlynn Lady, NP 07/22/15 2113   DIASTOLIC DYSFUNCTION 01/29/2010   Annotation: grade 2 per 2-D echo 01/26/10 Qualifier: Diagnosis of  By: Daryl FNP, Melissa S    Diverticulosis 10/03/2017   DYSPNEA ON EXERTION 01/16/2010   Qualifier: Diagnosis of  By: Daryl FNP, Melissa S    Dyspnea on exertion 03/17/2017   Edema 01/16/2010    Qualifier: Diagnosis of  By: Daryl FNP, Melissa S    Embolism - blood clot 2007   right leg hx of   Enlarged heart    Essential hypertension 01/29/2010   Qualifier: Diagnosis of  By: Joshua RN, Crystal    Last Assessment & Plan:  Formatting of this note might be different from the original. Relevant Hx: Course: Daily Update: Today's Plan:stable for her and will follow this for her  Electronically signed by: Eleanor Merlynn Lady, NP 07/22/15 2113   Gastroesophageal reflux disease without esophagitis 07/18/2015   Last Assessment & Plan:  Formatting of this note might be different from the original. Relevant Hx: Course: Daily Update: Today's Plan:this is stable for her at this time and will follow her  Electronically signed by: Eleanor Merlynn Lady, NP 07/22/15  2116   GERD (gastroesophageal reflux disease)    GERD, SEVERE 02/01/2010   Qualifier: Diagnosis of  By: Brien MD, Belvie BRAVO    Hypertension    Hypothyroidism    HYPOTHYROIDISM 01/16/2010   Qualifier: Diagnosis of  By: Daryl FNP, Melissa S    LIPOMA 05/15/2010   Qualifier: Diagnosis of  By: Daryl FNP, Melissa S    Lumbar spondylosis 02/18/2017   Formatting of this note might be different from the original. Noted on ([facility]) CT scan 02/2017   Mixed hyperlipidemia 07/18/2015   Last Assessment & Plan:  Formatting of this note might be different from the original. Relevant Hx: Course: Daily Update: Today's Plan:she is going to have her lipids updated fasting  Electronically signed by: Eleanor Merlynn Lady, NP 07/22/15 2112   OBESITY, MORBID 01/16/2010   Qualifier: Diagnosis of  By: Daryl FNP, Eleanor RAMAN   Formatting of this note might be different from the original. Overview:  Qualifier: Diagnosis of  By: Daryl FNP, Melissa S   Obstructive sleep apnea syndrome 01/16/2010   last sleep study over five years Formatting of this note might be different from the original. Overview:  Qualifier: Diagnosis of   By: Daryl FNP, Eleanor RAMAN  Last Assessment & Plan:  Formatting of this note might be different from the original. Relevant Hx: Course: Daily Update: Today's Plan:she cannot wear the mask for this and have discussed with her how this can affect her by not doing so     Osteopenia of multiple sites 06/02/2017   Formatting of this note might be different from the original. -2.3   Other acute sinusitis 01/29/2010   Qualifier: Diagnosis of  By: Brien MD, Belvie BRAVO    Prediabetes 07/22/2015   Last Assessment & Plan:  Formatting of this note might be different from the original. Relevant Hx: Course: Daily Update: Today's Plan:would update her HGBA1C for her and with her use of the steroids this can be elevated for her  Electronically signed by: Eleanor Merlynn Lady, NP 07/22/15 2109   Right rotator cuff tear 08/09/2014   Severe persistent asthma (HCC) 01/09/2015   Shamirah presents today with a one week history of wheezing and DOE and slight cough requiring her to use a SABA a few times per day. In addition, has nasal congestion and green rhinorrhea. She has been off her prednisone  for over two months and was doing very well until this flare up. She continue on her Nucala  and her large collection of medication for inflammation and reflux. Her reflux is under c   Shortness of breath dyspnea    with exertion   Sleep apnea    last sleep study over five years   SLEEP APNEA, OBSTRUCTIVE 01/16/2010   Qualifier: Diagnosis of  By: Daryl FNP, Melissa S    Thyroid disease    hypothyroidism   Vitamin D deficiency 02/02/2010   Qualifier: Diagnosis of  By: Daryl FNP, Eleanor RAMAN   Formatting of this note might be different from the original. Overview:  Qualifier: Diagnosis of  By: Daryl FNP, Eleanor RAMAN    Past Surgical History:  Procedure Laterality Date   CARPAL TUNNEL RELEASE Right    EYE SURGERY Bilateral    cataract removal    KNEE SURGERY  2007   right knee   LIPOMA EXCISION Right  03/28/2019   Procedure: EXCISION SUBCUTANEOUS LIPOMA RIGHT SHOULDER;  Surgeon: Belinda Cough, MD;  Location: [facility] OR;  Service: General;  Laterality: Right;  SHOULDER ARTHROSCOPY WITH OPEN ROTATOR CUFF REPAIR AND DISTAL CLAVICLE ACROMINECTOMY Right 08/09/2014   Procedure: SHOULDER ARTHROSCOPY WITH OPEN ROTATOR CUFF REPAIR AND DISTAL CLAVICLE ACROMINECTOMY;  Surgeon: Toribio Silos, MD;  Location: [facility] OR;  Service: Orthopedics;  Laterality: Right;   SINUS SURGERY WITH INSTATRAK     ???    Review of systems negative except as noted in HPI / PMHx or noted below:  Review of Systems  Constitutional: Negative.   HENT: Negative.    Eyes: Negative.   Respiratory: Negative.    Cardiovascular: Negative.   Gastrointestinal: Negative.   Genitourinary: Negative.   Musculoskeletal: Negative.   Skin: Negative.   Neurological: Negative.   Endo/Heme/Allergies: Negative.   Psychiatric/Behavioral: Negative.       Objective:   Vitals:   02/23/24 1603  BP: (!) 146/86  Pulse: 91  Resp: 16  SpO2: 98%          Physical Exam Constitutional:      Appearance: She is not diaphoretic.  HENT:     Head: Normocephalic.     Right Ear: External ear normal.     Left Ear: External ear normal.     Nose: Nose normal. No mucosal edema or rhinorrhea.     Mouth/Throat:     Pharynx: Uvula midline. No oropharyngeal exudate.  Eyes:     Conjunctiva/sclera: Conjunctivae normal.  Neck:     Thyroid: No thyromegaly.     Trachea: Trachea normal. No tracheal tenderness or tracheal deviation.  Cardiovascular:     Rate and Rhythm: Normal rate and regular rhythm.     Heart sounds: Normal heart sounds, S1 normal and S2 normal. No murmur heard. Pulmonary:     Effort: No respiratory distress.     Breath sounds: Normal breath sounds. No stridor. No wheezing (Deep vibrating large airway rhonchi with inspiration and expiration) or rales.  Lymphadenopathy:     Head:     Right side of head: No tonsillar adenopathy.      Left side of head: No tonsillar adenopathy.     Cervical: No cervical adenopathy.  Skin:    Findings: No erythema or rash.     Nails: There is no clubbing.  Neurological:     Mental Status: She is alert.     Diagnostics: none  Assessment and Plan:   1. Not well controlled severe persistent asthma (HCC)   2. Viral respiratory infection   3. Other allergic rhinitis   4. LPRD (laryngopharyngeal reflux disease)    1. Continue Tezepelumab  injections monthly  2. Continue Breztri  - 2 inhalations 2 times per day  (empty lungs)  3. Continue nasonex  1 spray each nostril 1-2 times per day  4. Continue pantoprazole  40mg  2 times per day + Famotidine  40 mg - 1 tablet in evening  5. If needed:   A. Albuterol  - 2 inhalations or DUONEB nebulizer every 4 hours    B. OTC antihistamine - Zyrtec / Allegra / Claritin , nasal saline   6. For this recent event:   A. Duoneb + budesonide  0.5 - nebulize every 4-6 hours  B. Tussionex -2.5-5.0 mL every 12 hours if needed for cough, 60 mL  7. Return to clinic in 12 weeks or earlier if problem  8. Influenza = Tamiflu. Covid = Paxlovid  Rease is 7 days into her viral respiratory tract infection.  Hopefully within the next 3 to 5 days she is going to start to show improvement.  She will remain on a collection of anti-inflammatory  agents as noted above and also continue to address her issue of reflux induced respiratory disease.  She is already been treated with azithromycin  from the urgent care center and she has been given a systemic steroid as well.  Assuming she does well we will see her back in this clinic in 12 weeks or earlier if there is a problem.  Camellia Denis, MD Allergy / Immunology [facility]

## 2024-02-27 ENCOUNTER — Encounter: Payer: Self-pay | Admitting: Allergy and Immunology

## 2024-02-28 ENCOUNTER — Ambulatory Visit

## 2024-03-07 ENCOUNTER — Ambulatory Visit (INDEPENDENT_AMBULATORY_CARE_PROVIDER_SITE_OTHER)

## 2024-03-07 DIAGNOSIS — J4550 Severe persistent asthma, uncomplicated: Secondary | ICD-10-CM

## 2024-03-13 ENCOUNTER — Ambulatory Visit: Admitting: Cardiology

## 2024-03-16 ENCOUNTER — Ambulatory Visit: Attending: Cardiology | Admitting: Cardiology

## 2024-03-16 ENCOUNTER — Encounter: Payer: Self-pay | Admitting: Cardiology

## 2024-03-16 VITALS — BP 133/83 | HR 89 | Ht 61.0 in | Wt 305.0 lb

## 2024-03-16 DIAGNOSIS — I2510 Atherosclerotic heart disease of native coronary artery without angina pectoris: Secondary | ICD-10-CM | POA: Diagnosis not present

## 2024-03-16 DIAGNOSIS — G4733 Obstructive sleep apnea (adult) (pediatric): Secondary | ICD-10-CM | POA: Diagnosis not present

## 2024-03-16 DIAGNOSIS — I5032 Chronic diastolic (congestive) heart failure: Secondary | ICD-10-CM | POA: Diagnosis not present

## 2024-03-16 DIAGNOSIS — I10 Essential (primary) hypertension: Secondary | ICD-10-CM

## 2024-03-16 DIAGNOSIS — K5790 Diverticulosis of intestine, part unspecified, without perforation or abscess without bleeding: Secondary | ICD-10-CM

## 2024-03-16 NOTE — Patient Instructions (Signed)
 Medication Instructions:  Your physician recommends that you continue on your current medications as directed. Please refer to the Current Medication list given to you today.  *If you need a refill on your cardiac medications before your next appointment, please call your pharmacy*   Lab Work: None Ordered If you have labs (blood work) drawn today and your tests are completely normal, you will receive your results only by: MyChart Message (if you have MyChart) OR A paper copy in the mail If you have any lab test that is abnormal or we need to change your treatment, we will call you to review the results.   Testing/Procedures: None Ordered   Follow-Up: At [facility], you and your health needs are our priority.  As part of our continuing mission to provide you with exceptional heart care, we have created designated Provider Care Teams.  These Care Teams include your primary Cardiologist (physician) and Advanced Practice Providers (APPs -  Physician Assistants and Nurse Practitioners) who all work together to provide you with the care you need, when you need it.  We recommend signing up for the patient portal called MyChart.  Sign up information is provided on this After Visit Summary.  MyChart is used to connect with patients for Virtual Visits (Telemedicine).  Patients are able to view lab/test results, encounter notes, upcoming appointments, etc.  Non-urgent messages can be sent to your provider as well.   To learn more about what you can do with MyChart, go to [email or web address].    Your next appointment:   6 month(s)  The format for your next appointment:   In Person  Provider:   Lamar Fitch, MD    Other Instructions Amb ref to weight management program- they will call for appt

## 2024-03-16 NOTE — Progress Notes (Signed)
 Cardiology Office Note:    Date:  03/16/2024   ID:  Beth Rodriguez, DOB Nov 04, 1960, MRN [id number]  PCP:  Beth Beth Rung, NP  Cardiologist:  Beth Fitch, Rodriguez    Referring Rodriguez: Beth Rodriguez*   Chief Complaint  Patient presents with   Follow-up  Doing fine  History of Present Illness:    Beth Rodriguez is a 63 y.o. female past medical history significant for essential hypertension, dyslipidemia, morbid obesity, she did have a stress test done in 2019 showed which showed a small area of ischemia refused to have a cardiac catheterization at the same time she is completely asymptomatic, then she had a calcium  score done which showed calcium  score only 26.  Comes today to months for follow-up, denies have any chest pain tightness squeezing pressure burning chest, she did try Wegovy  for weight loss sadly started having some issues with her diverticulosis, she lost 9 pounds but stopped Wegovy  and gained all this weight back.  Past Medical History:  Diagnosis Date   Abnormal stress test 04/18/2017   Acquired hypothyroidism 07/18/2015   Last Assessment & Plan:  Formatting of this note might be different from the original. Relevant Hx: Course: Daily Update: Today'Rodriguez Plan:she is going to have her TSH updated for her   Electronically signed by: Beth Rodriguez Benson, NP 07/22/15 2115   ADRENAL INSUFFICIENCY, HX OF 01/23/2010   Qualifier: Diagnosis of  By: Beth Rodriguez, Beth Rodriguez    Allergic rhinitis 12/12/2014   Aortic atherosclerosis 02/18/2017   Formatting of this note might be different from the original. Noted on CT scan ([facility]) 02/2017   Arthritis    Asthma    ASTHMA 01/16/2010   Qualifier: Diagnosis of  By: Beth Rodriguez, Beth Rodriguez    Cardiomegaly 01/16/2010   Qualifier: Diagnosis of  By: Beth Rodriguez, Beth Rodriguez    Carpal tunnel syndrome on right    Coronary artery disease 07/21/2017   With mildly abnormal stress test showing small defect involving  apex indicating distal LAD disease  Formatting of this note might be different from the original. Overview:  With mildly abnormal stress test showing small defect involving apex indicating distal LAD disease   COUGH, CHRONIC 01/23/2010   Qualifier: Diagnosis of  By: Beth Rodriguez, Beth Rodriguez    DEEP VENOUS THROMBOPHLEBITIS, LEG, RIGHT, HX OF 01/16/2010   Qualifier: Diagnosis of  By: Beth Rodriguez, Beth Rodriguez    Degeneration of lumbar intervertebral disc 07/18/2015   Last Assessment & Plan:  Formatting of this note might be different from the original. Relevant Hx: Course: Daily Update: Today'Rodriguez Plan:longstanding for her and she is still working and on her feet some days being harder for her than others, she is to again work on her weight to help with this  Electronically signed by: Beth Rodriguez Benson, NP 07/22/15 2113   DIASTOLIC DYSFUNCTION 01/29/2010   Annotation: grade 2 per 2-D echo 01/26/10 Qualifier: Diagnosis of  By: Beth Rodriguez, Beth Rodriguez    Diverticulosis 10/03/2017   DYSPNEA ON EXERTION 01/16/2010   Qualifier: Diagnosis of  By: Beth Rodriguez, Beth Rodriguez    Dyspnea on exertion 03/17/2017   Edema 01/16/2010   Qualifier: Diagnosis of  By: Beth Rodriguez, Beth Rodriguez    Embolism - blood clot 2007   right leg hx of   Enlarged heart    Essential hypertension 01/29/2010   Qualifier: Diagnosis of  By: Joshua RN, Crystal    Last Assessment & Plan:  Formatting  of this note might be different from the original. Relevant Hx: Course: Daily Update: Today'Rodriguez Plan:stable for her and will follow this for her  Electronically signed by: Beth Merlynn Lady, NP 07/22/15 2113   Gastroesophageal reflux disease without esophagitis 07/18/2015   Last Assessment & Plan:  Formatting of this note might be different from the original. Relevant Hx: Course: Daily Update: Today'Rodriguez Plan:this is stable for her at this time and will follow her  Electronically signed by: Beth Merlynn Lady, NP 07/22/15 2116    GERD (gastroesophageal reflux disease)    GERD, SEVERE 02/01/2010   Qualifier: Diagnosis of  By: Beth Rodriguez, Beth Rodriguez    Hypertension    Hypothyroidism    HYPOTHYROIDISM 01/16/2010   Qualifier: Diagnosis of  By: Beth Rodriguez, Beth Rodriguez    LIPOMA 05/15/2010   Qualifier: Diagnosis of  By: Beth Rodriguez, Beth Rodriguez    Lumbar spondylosis 02/18/2017   Formatting of this note might be different from the original. Noted on ([facility]) CT scan 02/2017   Mixed hyperlipidemia 07/18/2015   Last Assessment & Plan:  Formatting of this note might be different from the original. Relevant Hx: Course: Daily Update: Today'Rodriguez Plan:she is going to have her lipids updated fasting  Electronically signed by: Beth Merlynn Lady, NP 07/22/15 2112   OBESITY, MORBID 01/16/2010   Qualifier: Diagnosis of  By: Beth Rodriguez, Beth Rodriguez   Formatting of this note might be different from the original. Overview:  Qualifier: Diagnosis of  By: Beth Rodriguez, Beth Rodriguez   Obstructive sleep apnea syndrome 01/16/2010   last sleep study over five years Formatting of this note might be different from the original. Overview:  Qualifier: Diagnosis of  By: Beth Rodriguez, Beth Rodriguez  Last Assessment & Plan:  Formatting of this note might be different from the original. Relevant Hx: Course: Daily Update: Today'Rodriguez Plan:she cannot wear the mask for this and have discussed with her how this can affect her by not doing so     Osteopenia of multiple sites 06/02/2017   Formatting of this note might be different from the original. -2.3   Other acute sinusitis 01/29/2010   Qualifier: Diagnosis of  By: Beth Rodriguez, Beth Rodriguez    Prediabetes 07/22/2015   Last Assessment & Plan:  Formatting of this note might be different from the original. Relevant Hx: Course: Daily Update: Today'Rodriguez Plan:would update her HGBA1C for her and with her use of the steroids this can be elevated for her  Electronically signed by: Beth Merlynn Lady, NP 07/22/15  2109   Right rotator cuff tear 08/09/2014   Severe persistent asthma (HCC) 01/09/2015   Angle presents today with a one week history of wheezing and DOE and slight cough requiring her to use a SABA a few times per day. In addition, has nasal congestion and green rhinorrhea. She has been off her prednisone  for over two months and was doing very well until this flare up. She continue on her Nucala  and her large collection of medication for inflammation and reflux. Her reflux is under c   Shortness of breath dyspnea    with exertion   Sleep apnea    last sleep study over five years   SLEEP APNEA, OBSTRUCTIVE 01/16/2010   Qualifier: Diagnosis of  By: Beth Rodriguez, Beth Rodriguez    Thyroid disease    hypothyroidism   Vitamin D deficiency 02/02/2010   Qualifier: Diagnosis of  By: Beth Rodriguez, Beth Rodriguez   Formatting of this note  might be different from the original. Overview:  Qualifier: Diagnosis of  By: Beth Rodriguez, Beth Rodriguez    Past Surgical History:  Procedure Laterality Date   CARPAL TUNNEL RELEASE Right    EYE SURGERY Bilateral    cataract removal    KNEE SURGERY  2007   right knee   LIPOMA EXCISION Right 03/28/2019   Procedure: EXCISION SUBCUTANEOUS LIPOMA RIGHT SHOULDER;  Surgeon: Belinda Cough, Rodriguez;  Location: [facility] OR;  Service: General;  Laterality: Right;   SHOULDER ARTHROSCOPY WITH OPEN ROTATOR CUFF REPAIR AND DISTAL CLAVICLE ACROMINECTOMY Right 08/09/2014   Procedure: SHOULDER ARTHROSCOPY WITH OPEN ROTATOR CUFF REPAIR AND DISTAL CLAVICLE ACROMINECTOMY;  Surgeon: Toribio Silos, Rodriguez;  Location: [facility] OR;  Service: Orthopedics;  Laterality: Right;   SINUS SURGERY WITH INSTATRAK     ???    Current Medications: Current Meds  Medication Sig   albuterol  (PROVENTIL ) (2.5 MG/3ML) 0.083% nebulizer solution Take 2.5 mg by nebulization every 6 (six) hours as needed for wheezing or shortness of breath.   albuterol  (VENTOLIN  HFA) 108 (90 Base) MCG/ACT inhaler CAN INHALE TWO PUFFS EVERY FOUR TO  SIX HOURS AS NEEDED FOR COUGH OR WHEEZE.   aspirin EC 81 MG tablet Take 81 mg by mouth daily.    atorvastatin  (LIPITOR) 10 MG tablet Take 1 tablet (10 mg total) by mouth daily.   budeson-glycopyrrolate -formoterol  (BREZTRI  AEROSPHERE) 160-9-4.8 MCG/ACT AERO inhaler INHALE TWO PUFFS TWICE DAILY TO PREVENT COUGH OR WHEEZE. RINSE, GARGLE, AND SPIT AFTER USE.   famotidine  (PEPCID ) 40 MG tablet Take 1 tablet (40 mg total) by mouth every evening.   furosemide (LASIX) 20 MG tablet Take 20 mg by mouth daily.   ibuprofen (ADVIL) 800 MG tablet Take one tablet once or twice daily with food   ipratropium-albuterol  (DUONEB) 0.5-2.5 (3) MG/3ML SOLN Can use one vial in nebulizer every four to six hours as needed for cough or wheeze.   levothyroxine  (SYNTHROID , LEVOTHROID) 88 MCG tablet Take 88 mcg by mouth daily before breakfast.    loratadine  (CLARITIN ) 10 MG tablet Take 10 mg by mouth daily as needed for allergies. Reported on 07/21/2015   losartan  (COZAAR ) 100 MG tablet Take 100 mg by mouth daily.   mometasone  (NASONEX  24HR) 50 MCG/ACT nasal spray Use one spray in each nostril one to two times per day.   pantoprazole  (PROTONIX ) 40 MG tablet Take 40 mg by mouth daily.   Propylene Glycol (SYSTANE COMPLETE) 0.6 % SOLN Place 1 drop into both eyes daily as needed (dry eyes).   TEZSPIRE  210 MG/1. syringe INJECT 210 MG UNDER THE SKIN EVERY 4 WEEKS (TO BE ADMINISTERED BY A HEALTH CARE PROVIDER IN A HEALTH CARE SETTING, DISCONTINUE FASENRA )   Current Facility-Administered Medications for the 03/16/24 encounter (Office Visit) with Xoe Hoe J, Rodriguez  Medication   tezepelumab -ekko (TEZSPIRE ) 210 MG/1. syringe 210 mg     Allergies:   Penicillins, Theophyllines, Meloxicam, and Theophylline   Social History   Socioeconomic History   Marital status: Married    Spouse name: Not on file   Number of children: 1   Years of education: Not on file   Highest education level: Not on file  Occupational History    Occupation: PRODUCTION ASSEMBLY    Employer: [employer]  Tobacco Use   Smoking status: Never   Smokeless tobacco: Never  Vaping Use   Vaping status: Never Used  Substance and Sexual Activity   Alcohol use: No   Drug use: No   Sexual activity:  Not on file  Other Topics Concern   Not on file  Social History Narrative   Not on file   Social Drivers of Health   Financial Resource Strain: Not on file  Food Insecurity: Low Risk  (01/26/2024)   Received from [facility]   Hunger Vital Sign    Within the past 12 months, you worried that your food would run out before you got money to buy more: Never true    Within the past 12 months, the food you bought just didn't last and you didn't have money to get more. : Never true  Transportation Needs: No Transportation Needs (01/26/2024)   Received from [facility]    In the past 12 months, has lack of reliable transportation kept you from medical appointments, meetings, work or from getting things needed for daily living? : No  Physical Activity: Not on file  Stress: Not on file (02/17/2023)  Social Connections: Unknown (08/21/2021)   [facility]   Social Network    Social Network: Not on file     Family History: The patient'Rodriguez family history includes Cancer in her sister; Carpal tunnel syndrome in her daughter and sister; Diabetes in her sister; Hyperlipidemia in her sister; Hypertension in her brother, daughter, mother, and sister. ROS:   Please see the history of present illness.    All 14 point review of systems negative except as described per history of present illness  EKGs/Labs/Other Studies Reviewed:    EKG Interpretation Date/Time:  Friday March 16 2024 08:47:10 EST Ventricular Rate:  89 PR Interval:  180 QRS Duration:  102 QT Interval:  368 QTC Calculation: 447 R Axis:   -10  Text Interpretation: Sinus rhythm with Premature supraventricular complexes Incomplete right  bundle branch block When compared with ECG of 11-Oct-2022 11:41, No significant change was found Confirmed by Bernie Charleston [phone]) on 03/16/2024 8:48:46 AM    Recent Labs: No results found for requested labs within last 365 days.  Recent Lipid Panel    Component Value Date/Time   CHOL 137 07/21/2017 1015   TRIG 44 07/21/2017 1015   HDL 62 07/21/2017 1015   CHOLHDL 2.2 07/21/2017 1015   LDLCALC 66 07/21/2017 1015    Physical Exam:    VS:  BP 133/83   Pulse 89   Ht 5' 1 (1.549 m)   Wt (!) 305 lb (138.3 kg)   SpO2 97%   BMI 57.63 kg/m     Wt Readings from Last 3 Encounters:  03/16/24 (!) 305 lb (138.3 kg)  09/13/23 (!) 306 lb (138.8 kg)  06/10/23 (!) 303 lb (137.4 kg)     GEN:  Well nourished, well developed in no acute distress HEENT: Normal NECK: No JVD; No carotid bruits LYMPHATICS: No lymphadenopathy CARDIAC: RRR, no murmurs, no rubs, no gallops RESPIRATORY:  Clear to auscultation without rales, wheezing or rhonchi  ABDOMEN: Soft, non-tender, non-distended MUSCULOSKELETAL:  No edema; No deformity  SKIN: Warm and dry LOWER EXTREMITIES: no swelling NEUROLOGIC:  Alert and oriented x 3 PSYCHIATRIC:  Normal affect   ASSESSMENT:    1. Coronary artery disease involving native coronary artery of native heart without angina pectoris   2. Chronic heart failure with preserved ejection fraction (HCC)   3. Essential hypertension   4. Obstructive sleep apnea syndrome   5. Diverticulosis   6. OBESITY, MORBID    PLAN:    In order of problems listed above:  Coronary disease stable from that  point review without any symptoms we will continue monitoring. Congestive heart failure preserved left ventricular ejection fraction compensated on physical exam however exam is somewhat limited because of morbid obesity. Essential hypertension blood pressure well-controlled Obstructive sleep apnea follow-up by internal medicine team   Medication Adjustments/Labs and Tests  Ordered: Current medicines are reviewed at length with the patient today.  Concerns regarding medicines are outlined above.  Orders Placed This Encounter  Procedures   EKG 12-Lead   Medication changes: No orders of the defined types were placed in this encounter.   Signed, Beth DOROTHA Fitch, Rodriguez, [facility] 03/16/2024 9:05 AM    [facility]

## 2024-04-02 ENCOUNTER — Other Ambulatory Visit: Payer: Self-pay | Admitting: Allergy and Immunology

## 2024-04-03 ENCOUNTER — Ambulatory Visit

## 2024-04-09 ENCOUNTER — Ambulatory Visit: Admitting: *Deleted

## 2024-04-09 DIAGNOSIS — J4550 Severe persistent asthma, uncomplicated: Secondary | ICD-10-CM

## 2024-04-16 ENCOUNTER — Other Ambulatory Visit (HOSPITAL_BASED_OUTPATIENT_CLINIC_OR_DEPARTMENT_OTHER): Payer: Self-pay

## 2024-04-16 ENCOUNTER — Encounter: Payer: Self-pay | Admitting: Allergy and Immunology

## 2024-04-16 ENCOUNTER — Telehealth: Payer: Self-pay | Admitting: Allergy and Immunology

## 2024-04-16 VITALS — BP 116/74 | HR 80 | Temp 97.5°F | Resp 16

## 2024-04-16 DIAGNOSIS — J4550 Severe persistent asthma, uncomplicated: Secondary | ICD-10-CM | POA: Diagnosis not present

## 2024-04-16 DIAGNOSIS — K219 Gastro-esophageal reflux disease without esophagitis: Secondary | ICD-10-CM | POA: Diagnosis not present

## 2024-04-16 DIAGNOSIS — J3089 Other allergic rhinitis: Secondary | ICD-10-CM

## 2024-04-16 DIAGNOSIS — G9331 Postviral fatigue syndrome: Secondary | ICD-10-CM | POA: Diagnosis not present

## 2024-04-16 MED ORDER — HYDROCOD POLI-CHLORPHE POLI ER 10-8 MG/5ML PO SUER: ORAL | 0 refills | Status: AC

## 2024-04-16 MED ORDER — HYDROCOD POLI-CHLORPHE POLI ER 10-8 MG/5ML PO SUER
2.5000 mL | Freq: Two times a day (BID) | ORAL | 0 refills | Status: AC | PRN
  Filled 2024-04-16: qty 60, 6d supply, fill #0

## 2024-04-16 NOTE — Telephone Encounter (Signed)
 Patient seen in office

## 2024-04-16 NOTE — Patient Instructions (Addendum)
" °  1. Continue Tezepelumab  injections monthly  2. Continue Breztri  - 2 inhalations 2 times per day  (empty lungs)  3. Continue nasonex  1 spray each nostril 1-2 times per day  4. Continue pantoprazole  40mg  2 times per day + Famotidine  40 mg - 1 tablet in evening  5. If needed:   A. Albuterol  - 2 inhalations or DUONEB nebulizer every 4 hours    B. OTC antihistamine - Zyrtec / Allegra / Claritin , nasal saline   6. For this recent event:   A. Duoneb + budesonide  0.5 - nebulize every 4-6 hours  B. Tussionex -2.5-5.0 mL every 12 hours if needed for cough, 60 mL. NARCOTIC  C. Continue Tamiflu, doxycycline, methylprednisolone  D. Minimize coughing and throat clearing ([place])   7. Return to clinic in 12 weeks or earlier if problem  8. Influenza = Tamiflu. Covid = Paxlovid "

## 2024-04-16 NOTE — Telephone Encounter (Signed)
 Patient states she tested positive for the Flu on Thursday. She went to Urgent Care and they gave her Tamiflu and Prednisone . She is having a bad cough that is giving her a hard time breathing. She wants to know if Dr. Kozlow is able to see her today or if there is anything she is able to take. She also mentioned that she would be out of work again today since she is not feeling any better and would like to know if Dr. Maurilio would provide a work note for her.

## 2024-04-16 NOTE — Progress Notes (Signed)
 "  [place] - [place] - [place] - [place] - [place]   Follow-up Note  Referring Provider: Benson Eleanor Rung, NP Primary Provider: Benson Eleanor Rung, NP Date of Office Visit: 04/16/2024  Subjective:   Beth Rodriguez (DOB: April 17, 1960) is a 64 y.o. female who [facility] in re-evaluation of the following:  HPI: Bryce returns to this clinic in evaluation of severe asthma, allergic rhinitis, LPR.  I last saw her in this clinic 23 February 2024 at which point in time she had a viral induced exacerbation of her asthma.  She did well but unfortunately she contracted influenza and ended up in urgent care center on 12 April 2024 and was treated with a combination of Tamiflu, methylprednisolone , and doxycycline.  She is somewhat better yet still continues to cough and wheeze.  She has resolved her fever.  She does not have any chest pain although she has a little bit of sputum production on occasion which may be slightly green.  She does not have a lot of throat issues or nasal issues.  Her reflux remains under good control on her current plan.  Allergies as of 04/16/2024       Reactions   Penicillins Hives, Other (See Comments)   Did it involve swelling of the face/tongue/throat, SOB, or low BP? No Did it involve sudden or severe rash/hives, skin peeling, or any reaction on the inside of your mouth or nose?    #  #  YES  #  #  Did you need to seek medical attention at a hospital or doctor's office?    #  #  YES  #  #  When did it last happen?     2019    Theophyllines Other (See Comments)   HEADACHE Severe headaches   Meloxicam    Upset stomach   Theophylline Other (See Comments)   REACTION: headaches        Medication List    albuterol  (2.5 MG/3ML) 0.083% nebulizer solution Commonly known as: PROVENTIL  Take 2.5 mg by nebulization every 6 (six) hours as needed for wheezing or shortness of breath.   albuterol   108 (90 Base) MCG/ACT inhaler Commonly known as: VENTOLIN  HFA CAN INHALE TWO PUFFS EVERY FOUR TO SIX HOURS AS NEEDED FOR COUGH OR WHEEZE.   aspirin EC 81 MG tablet Take 81 mg by mouth daily.   atorvastatin  10 MG tablet Commonly known as: LIPITOR Take 1 tablet (10 mg total) by mouth daily.   Breztri  Aerosphere 160-9-4.8 MCG/ACT Aero inhaler Generic drug: budesonide -glycopyrrolate -formoterol  INHALE TWO PUFFS TWICE DAILY TO PREVENT COUGH OR WHEEZE. RINSE, GARGLE, AND SPIT AFTER USE.   doxycycline 100 MG capsule Commonly known as: VIBRAMYCIN Take 100 mg by mouth 2 (two) times daily.   famotidine  40 MG tablet Commonly known as: PEPCID  Take 1 tablet (40 mg total) by mouth every evening.   furosemide 20 MG tablet Commonly known as: LASIX Take 20 mg by mouth daily.   ibuprofen 800 MG tablet Commonly known as: ADVIL Take one tablet once or twice daily with food   ipratropium-albuterol  0.5-2.5 (3) MG/3ML Soln Commonly known as: DUONEB Can use one vial in nebulizer every four to six hours as needed for cough or wheeze.   levothyroxine  88 MCG tablet Commonly known as: SYNTHROID  Take 88 mcg by mouth daily before breakfast.   loratadine  10 MG tablet Commonly known as: CLARITIN  Take 10 mg by mouth daily as needed for allergies. Reported  on 07/21/2015   losartan  100 MG tablet Commonly known as: COZAAR  Take 100 mg by mouth daily.   methylPREDNISolone  4 MG Tbpk tablet Commonly known as: MEDROL  DOSEPAK SMARTSIG:- Tablet(s) By Mouth -   mometasone  50 MCG/ACT nasal spray Commonly known as: Nasonex  24HR Use one spray in each nostril one to two times per day.   oseltamivir 75 MG capsule Commonly known as: TAMIFLU Take 75 mg by mouth 2 (two) times daily.   pantoprazole  40 MG tablet Commonly known as: PROTONIX  Take 40 mg by mouth daily.   promethazine -dextromethorphan 6.25-15 MG/5ML syrup Commonly known as: PROMETHAZINE -DM Take 5 mLs by mouth every 6 (six) hours as needed.    Systane Complete 0.6 % Soln Generic drug: Propylene Glycol Place 1 drop into both eyes daily as needed (dry eyes).   Tezspire  210 MG/1. syringe Generic drug: tezepelumab -ekko INJECT 210 MG UNDER THE SKIN EVERY 4 WEEKS (TO BE ADMINISTERED BY A HEALTH CARE PROVIDER IN A HEALTH CARE SETTING, DISCONTINUE FASENRA )        Past Medical History:  Diagnosis Date   Abnormal stress test 04/18/2017   Acquired hypothyroidism 07/18/2015   Last Assessment & Plan:  Formatting of this note might be different from the original. Relevant Hx: Course: Daily Update: Today's Plan:she is going to have her TSH updated for her   Electronically signed by: Eleanor Merlynn Lady, NP 07/22/15 2115   ADRENAL INSUFFICIENCY, HX OF 01/23/2010   Qualifier: Diagnosis of  By: Daryl FNP, Melissa S    Allergic rhinitis 12/12/2014   Aortic atherosclerosis 02/18/2017   Formatting of this note might be different from the original. Noted on CT scan ([facility]) 02/2017   Arthritis    Asthma    ASTHMA 01/16/2010   Qualifier: Diagnosis of  By: Daryl FNP, Melissa S    Cardiomegaly 01/16/2010   Qualifier: Diagnosis of  By: Daryl FNP, Melissa S    Carpal tunnel syndrome on right    Coronary artery disease 07/21/2017   With mildly abnormal stress test showing small defect involving apex indicating distal LAD disease  Formatting of this note might be different from the original. Overview:  With mildly abnormal stress test showing small defect involving apex indicating distal LAD disease   COUGH, CHRONIC 01/23/2010   Qualifier: Diagnosis of  By: Daryl FNP, Melissa S    DEEP VENOUS THROMBOPHLEBITIS, LEG, RIGHT, HX OF 01/16/2010   Qualifier: Diagnosis of  By: Daryl FNP, Melissa S    Degeneration of lumbar intervertebral disc 07/18/2015   Last Assessment & Plan:  Formatting of this note might be different from the original. Relevant Hx: Course: Daily Update: Today's Plan:longstanding for her and she is  still working and on her feet some days being harder for her than others, she is to again work on her weight to help with this  Electronically signed by: Eleanor Merlynn Lady, NP 07/22/15 2113   DIASTOLIC DYSFUNCTION 01/29/2010   Annotation: grade 2 per 2-D echo 01/26/10 Qualifier: Diagnosis of  By: Daryl FNP, Melissa S    Diverticulosis 10/03/2017   DYSPNEA ON EXERTION 01/16/2010   Qualifier: Diagnosis of  By: Daryl FNP, Melissa S    Dyspnea on exertion 03/17/2017   Edema 01/16/2010   Qualifier: Diagnosis of  By: Daryl FNP, Melissa S    Embolism - blood clot 2007   right leg hx of   Enlarged heart    Essential hypertension 01/29/2010   Qualifier: Diagnosis of  By: Joshua RN, Crystal  Last Assessment & Plan:  Formatting of this note might be different from the original. Relevant Hx: Course: Daily Update: Today's Plan:stable for her and will follow this for her  Electronically signed by: Eleanor Merlynn Lady, NP 07/22/15 2113   Gastroesophageal reflux disease without esophagitis 07/18/2015   Last Assessment & Plan:  Formatting of this note might be different from the original. Relevant Hx: Course: Daily Update: Today's Plan:this is stable for her at this time and will follow her  Electronically signed by: Eleanor Merlynn Lady, NP 07/22/15 2116   GERD (gastroesophageal reflux disease)    GERD, SEVERE 02/01/2010   Qualifier: Diagnosis of  By: Brien MD, Belvie BRAVO    Hypertension    Hypothyroidism    HYPOTHYROIDISM 01/16/2010   Qualifier: Diagnosis of  By: Daryl FNP, Melissa S    LIPOMA 05/15/2010   Qualifier: Diagnosis of  By: Daryl FNP, Melissa S    Lumbar spondylosis 02/18/2017   Formatting of this note might be different from the original. Noted on ([facility]) CT scan 02/2017   Mixed hyperlipidemia 07/18/2015   Last Assessment & Plan:  Formatting of this note might be different from the original. Relevant Hx: Course: Daily Update: Today's Plan:she is  going to have her lipids updated fasting  Electronically signed by: Eleanor Merlynn Lady, NP 07/22/15 2112   OBESITY, MORBID 01/16/2010   Qualifier: Diagnosis of  By: Daryl FNP, Eleanor RAMAN   Formatting of this note might be different from the original. Overview:  Qualifier: Diagnosis of  By: Daryl FNP, Melissa S   Obstructive sleep apnea syndrome 01/16/2010   last sleep study over five years Formatting of this note might be different from the original. Overview:  Qualifier: Diagnosis of  By: Daryl FNP, Eleanor RAMAN  Last Assessment & Plan:  Formatting of this note might be different from the original. Relevant Hx: Course: Daily Update: Today's Plan:she cannot wear the mask for this and have discussed with her how this can affect her by not doing so     Osteopenia of multiple sites 06/02/2017   Formatting of this note might be different from the original. -2.3   Other acute sinusitis 01/29/2010   Qualifier: Diagnosis of  By: Brien MD, Belvie BRAVO    Prediabetes 07/22/2015   Last Assessment & Plan:  Formatting of this note might be different from the original. Relevant Hx: Course: Daily Update: Today's Plan:would update her HGBA1C for her and with her use of the steroids this can be elevated for her  Electronically signed by: Eleanor Merlynn Lady, NP 07/22/15 2109   Right rotator cuff tear 08/09/2014   Severe persistent asthma (HCC) 01/09/2015   Josephine presents today with a one week history of wheezing and DOE and slight cough requiring her to use a SABA a few times per day. In addition, has nasal congestion and green rhinorrhea. She has been off her prednisone  for over two months and was doing very well until this flare up. She continue on her Nucala  and her large collection of medication for inflammation and reflux. Her reflux is under c   Shortness of breath dyspnea    with exertion   Sleep apnea    last sleep study over five years   SLEEP APNEA, OBSTRUCTIVE 01/16/2010   Qualifier:  Diagnosis of  By: Daryl FNP, Melissa S    Thyroid disease    hypothyroidism   Vitamin D deficiency 02/02/2010   Qualifier: Diagnosis of  By: Daryl FNP, Melissa S  Formatting of this note might be different from the original. Overview:  Qualifier: Diagnosis of  By: Daryl FNP, Eleanor RAMAN    Past Surgical History:  Procedure Laterality Date   CARPAL TUNNEL RELEASE Right    EYE SURGERY Bilateral    cataract removal    KNEE SURGERY  2007   right knee   LIPOMA EXCISION Right 03/28/2019   Procedure: EXCISION SUBCUTANEOUS LIPOMA RIGHT SHOULDER;  Surgeon: Belinda Cough, MD;  Location: [facility] OR;  Service: General;  Laterality: Right;   SHOULDER ARTHROSCOPY WITH OPEN ROTATOR CUFF REPAIR AND DISTAL CLAVICLE ACROMINECTOMY Right 08/09/2014   Procedure: SHOULDER ARTHROSCOPY WITH OPEN ROTATOR CUFF REPAIR AND DISTAL CLAVICLE ACROMINECTOMY;  Surgeon: Toribio Silos, MD;  Location: [facility] OR;  Service: Orthopedics;  Laterality: Right;   SINUS SURGERY WITH INSTATRAK     ???    Review of systems negative except as noted in HPI / PMHx or noted below:  Review of Systems  Constitutional: Negative.   HENT: Negative.    Eyes: Negative.   Respiratory: Negative.    Cardiovascular: Negative.   Gastrointestinal: Negative.   Genitourinary: Negative.   Musculoskeletal: Negative.   Skin: Negative.   Neurological: Negative.   Endo/Heme/Allergies: Negative.   Psychiatric/Behavioral: Negative.       Objective:   Vitals:   04/16/24 1126  BP: 116/74  Pulse: 80  Resp: 16  Temp: (!) 97.5 F (36.4 C)  SpO2: 93%          Physical Exam Pulmonary:     Breath sounds: Wheezing (Expiratory wheezing all lung fields.  Inspiratory vibratory rhonchi.) present.     Diagnostics: none  Assessment and Plan:   1. Not well controlled severe persistent asthma (HCC)   2. Post-influenza syndrome   3. Other allergic rhinitis   4. LPRD (laryngopharyngeal reflux disease)    1. Continue Tezepelumab   injections monthly  2. Continue Breztri  - 2 inhalations 2 times per day  (empty lungs)  3. Continue nasonex  1 spray each nostril 1-2 times per day  4. Continue pantoprazole  40mg  2 times per day + Famotidine  40 mg - 1 tablet in evening  5. If needed:   A. Albuterol  - 2 inhalations or DUONEB nebulizer every 4 hours    B. OTC antihistamine - Zyrtec / Allegra / Claritin , nasal saline   6. For this recent event:   A. Duoneb + budesonide  0.5 - nebulize every 4-6 hours  B. Tussionex -2.5-5.0 mL every 12 hours if needed for cough, 60 mL. NARCOTIC  C. Continue Tamiflu, doxycycline, methylprednisolone  D. Minimize coughing and throat clearing ([facility])   7. Return to clinic in 12 weeks or earlier if problem  8. Influenza = Tamiflu. Covid = Paxlovid  Eniola obviously is suffering from influenza although she has resolved her constitutional and systemic symptoms which is a very good sign and no longer has a fever.  She is dealing with post influenza inflammation of her airway and she will utilize the plan noted above while she maintains good anti-inflammatory treatment for both her upper and lower and lower airway and also continues to address her issue with reflux.  Assuming she does well we will see her back in's clinic in 12 weeks or earlier if there is a problem.  Camellia Denis, MD Allergy / Immunology [facility] "

## 2024-04-17 ENCOUNTER — Encounter: Payer: Self-pay | Admitting: Allergy and Immunology

## 2024-05-07 ENCOUNTER — Ambulatory Visit

## 2024-05-16 ENCOUNTER — Ambulatory Visit

## 2024-05-16 DIAGNOSIS — J4550 Severe persistent asthma, uncomplicated: Secondary | ICD-10-CM

## 2024-05-21 ENCOUNTER — Ambulatory Visit: Admitting: Allergy and Immunology

## 2024-06-13 ENCOUNTER — Ambulatory Visit
